# Patient Record
Sex: Male | Born: 1940 | Race: White | Hispanic: No | Marital: Married | Smoking: Never smoker
Health system: Southern US, Community
[De-identification: ages and names within clinical notes are randomized; demographics above are authoritative.]

## PROBLEM LIST (undated history)

## (undated) DIAGNOSIS — I10 Essential (primary) hypertension: Secondary | ICD-10-CM

## (undated) DIAGNOSIS — I252 Old myocardial infarction: Secondary | ICD-10-CM

## (undated) DIAGNOSIS — Z951 Presence of aortocoronary bypass graft: Secondary | ICD-10-CM

## (undated) DIAGNOSIS — M545 Low back pain, unspecified: Secondary | ICD-10-CM

## (undated) DIAGNOSIS — I219 Acute myocardial infarction, unspecified: Secondary | ICD-10-CM

## (undated) DIAGNOSIS — G8929 Other chronic pain: Secondary | ICD-10-CM

## (undated) DIAGNOSIS — M4800 Spinal stenosis, site unspecified: Secondary | ICD-10-CM

## (undated) DIAGNOSIS — E538 Deficiency of other specified B group vitamins: Secondary | ICD-10-CM

## (undated) DIAGNOSIS — J189 Pneumonia, unspecified organism: Secondary | ICD-10-CM

## (undated) DIAGNOSIS — E118 Type 2 diabetes mellitus with unspecified complications: Secondary | ICD-10-CM

## (undated) DIAGNOSIS — N184 Chronic kidney disease, stage 4 (severe): Secondary | ICD-10-CM

## (undated) DIAGNOSIS — N200 Calculus of kidney: Secondary | ICD-10-CM

## (undated) DIAGNOSIS — I739 Peripheral vascular disease, unspecified: Secondary | ICD-10-CM

## (undated) DIAGNOSIS — Z9289 Personal history of other medical treatment: Secondary | ICD-10-CM

## (undated) DIAGNOSIS — R234 Changes in skin texture: Secondary | ICD-10-CM

## (undated) DIAGNOSIS — I2510 Atherosclerotic heart disease of native coronary artery without angina pectoris: Secondary | ICD-10-CM

## (undated) DIAGNOSIS — I25810 Atherosclerosis of coronary artery bypass graft(s) without angina pectoris: Secondary | ICD-10-CM

## (undated) DIAGNOSIS — E785 Hyperlipidemia, unspecified: Secondary | ICD-10-CM

## (undated) DIAGNOSIS — H5440 Blindness, one eye, unspecified eye: Secondary | ICD-10-CM

## (undated) DIAGNOSIS — M1990 Unspecified osteoarthritis, unspecified site: Secondary | ICD-10-CM

## (undated) DIAGNOSIS — E1140 Type 2 diabetes mellitus with diabetic neuropathy, unspecified: Secondary | ICD-10-CM

## (undated) HISTORY — PX: CATARACT EXTRACTION W/ INTRAOCULAR LENS  IMPLANT, BILATERAL: SHX1307

## (undated) HISTORY — DX: Atherosclerosis of coronary artery bypass graft(s) without angina pectoris: I25.810

## (undated) HISTORY — DX: Peripheral vascular disease, unspecified: I73.9

## (undated) HISTORY — DX: Atherosclerotic heart disease of native coronary artery without angina pectoris: I25.10

## (undated) HISTORY — PX: CARPAL TUNNEL RELEASE: SHX101

## (undated) HISTORY — PX: DOPPLER ECHOCARDIOGRAPHY: SHX263

## (undated) HISTORY — PX: CHOLECYSTECTOMY: SHX55

## (undated) HISTORY — PX: TONSILLECTOMY: SUR1361

## (undated) HISTORY — DX: Personal history of other medical treatment: Z92.89

## (undated) HISTORY — PX: BACK SURGERY: SHX140

## (undated) HISTORY — DX: Old myocardial infarction: I25.2

## (undated) HISTORY — PX: CORONARY ARTERY BYPASS GRAFT: SHX141

---

## 1989-12-19 DIAGNOSIS — I2510 Atherosclerotic heart disease of native coronary artery without angina pectoris: Secondary | ICD-10-CM

## 1989-12-19 HISTORY — DX: Atherosclerotic heart disease of native coronary artery without angina pectoris: I25.10

## 1990-06-04 HISTORY — PX: CARDIAC CATHETERIZATION: SHX172

## 1997-01-13 HISTORY — PX: CARDIAC CATHETERIZATION: SHX172

## 1998-06-16 ENCOUNTER — Ambulatory Visit (HOSPITAL_COMMUNITY): Admission: RE | Admit: 1998-06-16 | Discharge: 1998-06-16 | Payer: Self-pay | Admitting: Cardiology

## 1998-12-19 DIAGNOSIS — I25810 Atherosclerosis of coronary artery bypass graft(s) without angina pectoris: Secondary | ICD-10-CM

## 1998-12-19 DIAGNOSIS — Z951 Presence of aortocoronary bypass graft: Secondary | ICD-10-CM

## 1998-12-19 DIAGNOSIS — I219 Acute myocardial infarction, unspecified: Secondary | ICD-10-CM

## 1998-12-19 HISTORY — PX: OTHER SURGICAL HISTORY: SHX169

## 1998-12-19 HISTORY — DX: Presence of aortocoronary bypass graft: Z95.1

## 1998-12-19 HISTORY — DX: Acute myocardial infarction, unspecified: I21.9

## 1998-12-19 HISTORY — DX: Atherosclerosis of coronary artery bypass graft(s) without angina pectoris: I25.810

## 1999-01-21 ENCOUNTER — Inpatient Hospital Stay (HOSPITAL_COMMUNITY): Admission: EM | Admit: 1999-01-21 | Discharge: 1999-01-29 | Payer: Self-pay | Admitting: Emergency Medicine

## 1999-01-21 ENCOUNTER — Encounter: Payer: Self-pay | Admitting: Emergency Medicine

## 1999-01-25 ENCOUNTER — Encounter: Payer: Self-pay | Admitting: Cardiovascular Disease

## 1999-01-26 ENCOUNTER — Encounter: Payer: Self-pay | Admitting: Cardiovascular Disease

## 1999-01-27 ENCOUNTER — Encounter: Payer: Self-pay | Admitting: Cardiothoracic Surgery

## 1999-01-28 HISTORY — PX: CARDIAC CATHETERIZATION: SHX172

## 2001-04-25 ENCOUNTER — Encounter: Admission: RE | Admit: 2001-04-25 | Discharge: 2001-04-25 | Payer: Self-pay | Admitting: Family Medicine

## 2001-04-25 ENCOUNTER — Encounter: Payer: Self-pay | Admitting: Family Medicine

## 2002-05-14 ENCOUNTER — Encounter: Admission: RE | Admit: 2002-05-14 | Discharge: 2002-05-14 | Payer: Self-pay | Admitting: Cardiology

## 2002-05-14 ENCOUNTER — Encounter: Payer: Self-pay | Admitting: Cardiology

## 2002-05-16 ENCOUNTER — Ambulatory Visit (HOSPITAL_COMMUNITY): Admission: RE | Admit: 2002-05-16 | Discharge: 2002-05-16 | Payer: Self-pay | Admitting: Cardiology

## 2002-05-16 HISTORY — PX: CARDIAC CATHETERIZATION: SHX172

## 2002-05-22 ENCOUNTER — Ambulatory Visit (HOSPITAL_COMMUNITY): Admission: RE | Admit: 2002-05-22 | Discharge: 2002-05-23 | Payer: Self-pay | Admitting: Cardiology

## 2002-05-22 HISTORY — PX: CORONARY ANGIOPLASTY WITH STENT PLACEMENT: SHX49

## 2002-09-16 ENCOUNTER — Inpatient Hospital Stay (HOSPITAL_COMMUNITY): Admission: EM | Admit: 2002-09-16 | Discharge: 2002-09-18 | Payer: Self-pay | Admitting: *Deleted

## 2002-09-16 ENCOUNTER — Encounter: Payer: Self-pay | Admitting: General Surgery

## 2002-09-17 ENCOUNTER — Encounter: Payer: Self-pay | Admitting: General Surgery

## 2002-09-17 ENCOUNTER — Encounter (INDEPENDENT_AMBULATORY_CARE_PROVIDER_SITE_OTHER): Payer: Self-pay | Admitting: Specialist

## 2002-10-04 ENCOUNTER — Emergency Department (HOSPITAL_COMMUNITY): Admission: EM | Admit: 2002-10-04 | Discharge: 2002-10-04 | Payer: Self-pay | Admitting: Emergency Medicine

## 2002-10-08 ENCOUNTER — Inpatient Hospital Stay (HOSPITAL_COMMUNITY): Admission: EM | Admit: 2002-10-08 | Discharge: 2002-10-25 | Payer: Self-pay | Admitting: Surgery

## 2002-10-09 ENCOUNTER — Encounter: Payer: Self-pay | Admitting: General Surgery

## 2002-10-09 ENCOUNTER — Encounter: Payer: Self-pay | Admitting: Internal Medicine

## 2002-10-10 ENCOUNTER — Encounter: Payer: Self-pay | Admitting: Surgery

## 2002-10-11 ENCOUNTER — Encounter: Payer: Self-pay | Admitting: General Surgery

## 2002-10-14 ENCOUNTER — Encounter (INDEPENDENT_AMBULATORY_CARE_PROVIDER_SITE_OTHER): Payer: Self-pay | Admitting: Cardiology

## 2002-10-18 ENCOUNTER — Encounter: Payer: Self-pay | Admitting: General Surgery

## 2002-10-21 ENCOUNTER — Encounter: Payer: Self-pay | Admitting: Surgery

## 2002-10-22 ENCOUNTER — Encounter: Payer: Self-pay | Admitting: General Surgery

## 2002-10-25 ENCOUNTER — Encounter: Payer: Self-pay | Admitting: General Surgery

## 2002-11-01 ENCOUNTER — Ambulatory Visit (HOSPITAL_COMMUNITY): Admission: RE | Admit: 2002-11-01 | Discharge: 2002-11-01 | Payer: Self-pay | Admitting: General Surgery

## 2002-11-01 ENCOUNTER — Encounter: Payer: Self-pay | Admitting: General Surgery

## 2003-06-16 ENCOUNTER — Ambulatory Visit (HOSPITAL_COMMUNITY): Admission: RE | Admit: 2003-06-16 | Discharge: 2003-06-16 | Payer: Self-pay | Admitting: Gastroenterology

## 2003-06-16 ENCOUNTER — Encounter (INDEPENDENT_AMBULATORY_CARE_PROVIDER_SITE_OTHER): Payer: Self-pay | Admitting: *Deleted

## 2003-08-26 ENCOUNTER — Encounter: Payer: Self-pay | Admitting: Emergency Medicine

## 2003-08-27 ENCOUNTER — Encounter: Payer: Self-pay | Admitting: Cardiology

## 2003-08-27 ENCOUNTER — Inpatient Hospital Stay (HOSPITAL_COMMUNITY): Admission: EM | Admit: 2003-08-27 | Discharge: 2003-08-28 | Payer: Self-pay | Admitting: Emergency Medicine

## 2003-08-28 HISTORY — PX: CARDIAC CATHETERIZATION: SHX172

## 2005-07-07 ENCOUNTER — Ambulatory Visit (HOSPITAL_COMMUNITY): Admission: RE | Admit: 2005-07-07 | Discharge: 2005-07-07 | Payer: Self-pay | Admitting: Cardiology

## 2005-07-07 HISTORY — PX: CARDIAC CATHETERIZATION: SHX172

## 2006-04-19 ENCOUNTER — Encounter: Payer: Self-pay | Admitting: General Surgery

## 2006-10-11 ENCOUNTER — Ambulatory Visit (HOSPITAL_COMMUNITY): Admission: RE | Admit: 2006-10-11 | Discharge: 2006-10-11 | Payer: Self-pay | Admitting: Gastroenterology

## 2008-12-19 DIAGNOSIS — Z9289 Personal history of other medical treatment: Secondary | ICD-10-CM

## 2008-12-19 HISTORY — DX: Personal history of other medical treatment: Z92.89

## 2009-09-08 ENCOUNTER — Encounter: Admission: RE | Admit: 2009-09-08 | Discharge: 2009-09-08 | Payer: Self-pay | Admitting: Orthopedic Surgery

## 2009-10-16 ENCOUNTER — Inpatient Hospital Stay (HOSPITAL_COMMUNITY): Admission: RE | Admit: 2009-10-16 | Discharge: 2009-10-19 | Payer: Self-pay | Admitting: Orthopedic Surgery

## 2010-06-17 ENCOUNTER — Encounter: Admission: RE | Admit: 2010-06-17 | Discharge: 2010-06-17 | Payer: Self-pay | Admitting: Orthopedic Surgery

## 2010-07-29 ENCOUNTER — Inpatient Hospital Stay (HOSPITAL_COMMUNITY): Admission: RE | Admit: 2010-07-29 | Discharge: 2010-07-31 | Payer: Self-pay | Admitting: Orthopedic Surgery

## 2010-07-29 ENCOUNTER — Encounter (INDEPENDENT_AMBULATORY_CARE_PROVIDER_SITE_OTHER): Payer: Self-pay | Admitting: Orthopedic Surgery

## 2011-03-04 LAB — COMPREHENSIVE METABOLIC PANEL
ALT: 24 U/L (ref 0–53)
AST: 25 U/L (ref 0–37)
Albumin: 4.3 g/dL (ref 3.5–5.2)
Calcium: 9.3 mg/dL (ref 8.4–10.5)
Chloride: 107 mEq/L (ref 96–112)
Sodium: 140 mEq/L (ref 135–145)

## 2011-03-04 LAB — GLUCOSE, CAPILLARY
Glucose-Capillary: 169 mg/dL — ABNORMAL HIGH (ref 70–99)
Glucose-Capillary: 195 mg/dL — ABNORMAL HIGH (ref 70–99)
Glucose-Capillary: 84 mg/dL (ref 70–99)

## 2011-03-04 LAB — CBC
HCT: 34.7 % — ABNORMAL LOW (ref 39.0–52.0)
Hemoglobin: 12 g/dL — ABNORMAL LOW (ref 13.0–17.0)
MCV: 94.6 fL (ref 78.0–100.0)
RDW: 13.2 % (ref 11.5–15.5)
WBC: 6.7 10*3/uL (ref 4.0–10.5)

## 2011-03-23 LAB — GLUCOSE, CAPILLARY: Glucose-Capillary: 177 mg/dL — ABNORMAL HIGH (ref 70–99)

## 2011-03-24 LAB — GLUCOSE, CAPILLARY
Glucose-Capillary: 152 mg/dL — ABNORMAL HIGH (ref 70–99)
Glucose-Capillary: 159 mg/dL — ABNORMAL HIGH (ref 70–99)
Glucose-Capillary: 165 mg/dL — ABNORMAL HIGH (ref 70–99)
Glucose-Capillary: 185 mg/dL — ABNORMAL HIGH (ref 70–99)
Glucose-Capillary: 211 mg/dL — ABNORMAL HIGH (ref 70–99)
Glucose-Capillary: 86 mg/dL (ref 70–99)

## 2011-03-24 LAB — COMPREHENSIVE METABOLIC PANEL
ALT: 28 U/L (ref 0–53)
AST: 26 U/L (ref 0–37)
Alkaline Phosphatase: 54 U/L (ref 39–117)
CO2: 27 mEq/L (ref 19–32)
Calcium: 9.5 mg/dL (ref 8.4–10.5)
GFR calc Af Amer: 58 mL/min — ABNORMAL LOW (ref >60)
GFR calc non Af Amer: 48 mL/min — ABNORMAL LOW (ref >60)
Glucose, Bld: 188 mg/dL — ABNORMAL HIGH (ref 70–99)
Potassium: 4.2 mEq/L (ref 3.5–5.1)
Sodium: 141 mEq/L (ref 135–145)

## 2011-03-24 LAB — CROSSMATCH
ABO/RH(D): O POS
Antibody Screen: NEGATIVE

## 2011-03-24 LAB — HEMOGLOBIN AND HEMATOCRIT, BLOOD
HCT: 23.4 % — ABNORMAL LOW (ref 39.0–52.0)
Hemoglobin: 8.1 g/dL — ABNORMAL LOW (ref 13.0–17.0)
Hemoglobin: 9.9 g/dL — ABNORMAL LOW (ref 13.0–17.0)

## 2011-03-24 LAB — POCT I-STAT 4, (NA,K, GLUC, HGB,HCT)
Glucose, Bld: 90 mg/dL (ref 70–99)
Hemoglobin: 11.2 g/dL — ABNORMAL LOW (ref 13.0–17.0)
Potassium: 4.2 mEq/L (ref 3.5–5.1)

## 2011-05-06 NOTE — Cardiovascular Report (Signed)
[place]. [facility]  Patient:    Allen Medina, Allen Medina Visit Number: [id number] MRN: [id number]          Service Type: CAT Location: [place] 01 Attending Physician:  Lawana Pai Dictated by:   Chase Picket, M.D. Proc. Date: 05/16/02 Admit Date:  05/16/2002 Discharge Date: 05/16/2002   CC:         Cardiac Catheterization Laboratory  CVTS  Elayne Snare, M.D.   Cardiac Catheterization  INDICATIONS FOR PROCEDURE: The patient is a 70 year old male who had re-do bypass surgery in 2000 with his initial bypass surgery being 1991. He presented with anginal complaints of tiredness and fatigue, no energy and diminished stamina, which was his anginal complaint in the past. He is broughT in for outpatient cardiac catheterization with graft visualization.  DESCRIPTION OF PROCEDURE: The patient was prepped and draped in the usual sterile fashion exposing the right groin.  Following local anesthetic with 1% Xylocaine, the Seldinger technique was employed and a 6 [place] introducer sheath was placed into the right femoral artery.  Selective left and right coronary arteriography, graft visualization x4, and ventriculography in the RAO projection was performed.  RESULTS: 1. Hemodynamic monitoring: Central aortic pressure was 172/74, left    ventricular pressure 172/15 with no aortic valve gradient noted at the    time of pullback. 2. Ventriculography: Ventriculography in the RAO projection revealed    mild diffuse global hypokinesia with an ejection fraction of [id number],    end-diastolic pressure 15.  CORONARY ARTERIOGRAPHY: Calcification in the distribution of the LAD was noted on fluoroscopy. 1. Left main: The left main had a long tapering 80% area of narrowing. 2. LAD: The LAD is 100% occluded at its ostium. 3. Circumflex: The circumflex was subtotally and had a string-like appearance    in the AV groove. There was a very small 1 mm OM branch faintly  visualized. 4. Right coronary artery: The right coronary artery was diffusely disease with    proximal mid 50-60% areas of narrowing and the distal portion had a very    long beaded area of 80% narrowing. The PDA and PL, however, looked    relatively free of disease.  GRAFT VISUALIZATION: 1. Saphenous vein graft to the diagonal. The graft itself was widely patent.    The diagonal was free of disease and there was faint ______ visualization    of the LAD via collaterals and retrograde filling. 2. Saphenous vein graft to OM-1 and OM-2. The graft was widely patent. OM-1    and OM-2 were widely patent with a large vascular beds. There were    collaterals coming off of the circumflex off the marginals that supplied    the LAD. 3. Free RIMA to the PDA. The mid graft had an area that was about 20 mm    in length and about 75% narrowed. It was eccentric in nature. The distal    graft was free of disease and the PDA itself was free of disease. 4. Left internal mammary artery to the LAD. The LIMA itself was free of    disease. The LAD was free of disease distally and the proximal portion    well above the insertion of the LAD had an area of 75% occlusion.  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 This needs to be intervened upon. I talked with the patient and we  will bring him back with a 3.0 x 23 coated stent is available. We currently have none of these available at this time. Dictated by:   Chase Picket, M.D. Attending Physician:  Lawana Pai DD:  05/16/02 TD:  05/17/02 Job: [id number]

## 2011-05-06 NOTE — Cardiovascular Report (Signed)
[place]  Patient:    HATTAN, PICHON Visit Number: [id number] MRN: [id number]          Service Type: CAT Location: [place] 02 Attending Physician:  Lawana Pai Dictated by:   Chase Picket, M.D. Proc. Date: 05/22/02 Admit Date:  05/22/2002 Discharge Date: 05/23/2002   CC:         Elayne Snare, M.D.  [facility]   Cardiac Catheterization  PROCEDURE:  Primary stenting to free right internal mammary artery, that is used as a graft to the posterior descending artery.  INDICATIONS FOR TEST:  The patient is a 70 year old male who has had two bypass surgeries.  He was brought in for cardiac catheterization on May 16, 2002 and noted to have a tubular lesion in the free right internal mammary artery, that we used to graft his PDA artery.  There was a 75% area of tubular narrowing that was eccentric in appearance, that almost looked like a dissection.  At the time of his original catheterization we did not have appropriate equipment to address this.  He is brought in for intervention today.  DESCRIPTION OF PROCEDURE:  The patient was prepped and draped in the usual sterile fashion, exposing the right groin and applying local anesthetic with 1% Xylocaine.  A 7-French introducer sheath was placed in the right femoral artery.  A 7-French right coronary bypass catheter was used to cannulate the ostium of the graft.  A 182 cm Luge was then passed in the distal portion of the graft.  A 3.0 x 23 mm Cypher stent was placed in such a position that both the proximal and distal portion of the area of narrowing was covered.  It was initially deployed at 14 atm for 60 sec, with the final inflation being 16 x 60.  The 75% area of eccentric narrowing now appeared to be normal after stenting.  There was TIMI-3 flow both pre- and post intervention.  DISPOSITION:  The patient was given 5000 units of intravenous heparin, and had an ACT in  the mid portion of the procedure at 287.  He will be maintained on Integrilin for 12 hours following the procedure, and should be ready for discharge in the morning. Dictated by:   Chase Picket, M.D. Attending Physician:  Lawana Pai DD:  05/22/02 TD:  05/23/02 Job: [id number]

## 2011-05-06 NOTE — Consult Note (Signed)
NAME:  Allen Medina, Allen Medina                        ACCOUNT NO.:  [id number]   MEDICAL RECORD NO.:  [id number]                   PATIENT TYPE:  INP   LOCATION:  5703                                 FACILITY:  [facility]   PHYSICIAN:  Jeanella Craze. Little, M.D.              DATE OF BIRTH:  02-20-1941   DATE OF CONSULTATION:  DATE OF DISCHARGE:                                   CONSULTATION   Mr. Allen Medina is located at 92.   HISTORY OF PRESENT ILLNESS:  Allen Medina is a 70 year old male who is well-  known to me.  He was admitted at this time with right upper quadrant pain,  had a positive abdominal ultrasound for gallstones and needs a  cholecystectomy in the morning.   His cardiac history includes bypass surgery in 1991 and redo surgery in  February 2000.  He had a stent placed to the right internal mammary artery  to the PDA on 05/22/02.  This was a cipher stent, and he has been on Plavix  for over three months.   He denies any anginal complaints, no shortness of breath, palpitations, PND,  or orthopnea.  Blood pressure has been well controlled.  His diabetes has  been slightly increased.  He does not smoke cigarettes.   PAST MEDICAL HISTORY:  Includes type 2 diabetes since 1986, hyperlipidemia,  history of CAD, and hypertension.   OUTPATIENT MEDICATIONS:  Diovan, HCTZ 160/12.5, Plavix 25 mg, Glyburide 5 mg  b.i.d., Glucophage XR 500 mg b.i.d., aspirin once a day, Lescol 80 mg.   REVIEW OF SYSTEMS:  No peripheral swelling.  Loose bowel movements with  greasy meals, but no vomiting or nausea.  Exercise tolerance has been good.  Weight:  Stable.  No bleeding from the Plavix.   PHYSICAL EXAMINATION:  Blood pressure 130/72, pulse 80, respirations 20.  LUNGS:  Completely clear.  CARDIAC:  Regular rhythm, distant heart sounds, no murmur.  ABDOMEN:  Normal bowel sounds, right upper quadrant tenderness to palpation.  NECK:  No carotid bruits.  EXTREMITIES:  +2 pulses in the lower  extremities.  SKIN:  Warm and dry.   LABORATORY DATA:  Showed normal CK and troponin.  EKG showing nonspecific ST  segment changes.  Gallbladder ultrasound report as being positive for  stones.  Liver functions were normal with SGOT/SGPT being 24 and 27.   ASSESSMENT:  1. Right upper quadrant tenderness with positive ultrasound.  2. Coronary artery disease, stable, status post redo bypass surgery in 2000     and cipher stent placement to his right internal mammary artery to the     posterior descending artery in June 2003.  3. Hypertension, well controlled.  4. Type 2 diabetes.  5. The patient is cleared for surgery.  It is o.k. to stop his Plavix for     now and restart after surgery.  My only concern is the cipher stent as  there is an increase incidence of thrombosis within the stent unless the     patients have been on Plavix for a minimum of 90 day which Allen Medina has     done.  The risk should be relatively low to interrupt this.                                               Jeanella Craze. Little, M.D.    ABL/MEDQ  D:  09/16/2002  T:  09/18/2002  Job:  [id number]

## 2011-05-06 NOTE — Cardiovascular Report (Signed)
Allen Medina, Allen Medina              ACCOUNT NO.:  [id number]   MEDICAL RECORD NO.:  [id number]          PATIENT TYPE:  OIB   LOCATION:  2899                         FACILITY:  [facility]   PHYSICIAN:  Jeanella Craze. Little, M.D. DATE OF BIRTH:  Apr 22, 1941   DATE OF PROCEDURE:  07/07/2005  DATE OF DISCHARGE:                              CARDIAC CATHETERIZATION   INDICATIONS FOR TEST:  This 70 year old male had bypass surgery in 1991 and  redo bypass surgery in 2000.  He subsequently underwent stent placement in  2003 with a stent into the free right internal mammary artery that ran to  the posterior descending artery.  Cath September, 2004 was widely patent.   He presents with new onset of marked exertional dyspnea.  Normal activities  make him breathless, he has had some minimal chest pain, he perceives this  as all being new.   He has longstanding diabetes and is on metformin and Benicar.  Both of these  were held prior to the cath.  His initial creatinine was slightly elevated  at 1.7, but after holding the Benicar his creatinine was slightly elevated  at 1.5, but we held the Benicar prior to the cardiac catheterization.   After obtaining informed consent, the patient was prepped and draped in the  usual sterile fashion exposing the right groin.  Applying local anesthetic  with 1% Xylocaine, the Seldinger technique was employed and a 5 [place]  introducer sheath was placed into the right femoral artery.  Left and right  coronary arteriography, ventriculography in the RAO projection and an aortic  root was performed.   COMPLICATIONS:  None.   EQUIPMENT:  The 5 French Judkins configuration catheters and allografts  including the internal mammary artery were subselectively cannulated with a  right coronary catheter.   TOTAL CONTRAST:  140 mL.   RESULTS:  1.  Hemodynamic monitoring.  Central aortic pressure is 163/89, left      ventricular pressure was 155/1 with no aortic valve gradient  noted at      the time of pullback.  2.  Ventriculography.  Ventriculography in the RAO projection revealed a      slightly dilated left ventricle with an ejection fraction of 45-50%,      PVCs during the ventriculogram were noted and there was +1 mitral      regurgitation that may have been PVC induced.  3.  Aortic root:  The aortic root appeared to be slightly dilated.  There      was clearly three grafts going towards the anterior portion of the heart      and one going towards the posterior portion of heart.  Each of these      grafts were subselectively evaluated.   NATIVE CORONARY ARTERIOGRAPHY:  There was dense calcification in the left  main and LAD.  1.  Left main terminal 80% narrowing.  2.  LAD 100% occluded at its ostium.  3.  Circumflex 100% occluded in his mid portion, but diffusely diseased      proximal to this.  There was patent visualization of a small OM.  4.  Right coronary artery.  Diffusely diseased throughout with severe      narrowing that is graded 75-80%.  The posterior descending artery,      however, was visualized.  The RCA system in its more distal portion was      extremely small.  In order to do any type of intervention to this, it      would take almost complete stenting, and the distal portion of the      vessel was too small for stent and would require cutting balloon.   GRAFTS:  1.  Left internal mammary artery to the mid LAD.  The internal mammary      artery was widely patent, the LAD was small.  The distal and mid LAD      were widely patent.  There was retrograde filling of the LAD into the      proximal portion.  There was moderate irregularities of 60-75% in the      proximal portion of the LAD, this is unchanged from his cath in 2004 and      these areas are not amenable to any type of intervention.  2.  Saphenous vein graft sequentially to OM #2 and OM #3.  This graft is      widely patent.  OM #2 and OM #3 are both small but free of  disease.  3.  Saphenous vein graft to OM #1.  The graft is relatively small in      diameter with moderate irregularities.  The OM itself is relatively      small also.  It is free of significant disease.  4.  Saphenous vein graft to the diagonal.  The graft is widely patent.  The      diagonal is small and free of disease.  5.  Free RIMA to the PDA.  The RIMA is widely patent.  There is a stent in      the mid portion of it which is widely patent.  There are moderate      irregularities in the proximal and distal portion of this graft.  The      PDA is small and free of significant disease.  This vessel that the RIMA      inserts into does not appear to be the same vessel as noted from the      native circulation, raising the concern that he may have some ischemia      based on the native circulation.   CONCLUSION:  Patent grafts including patent stent in the free right internal  mammary artery to the posterior descending artery, diffuse native disease.   In comparing the native PDA from the right coronary artery and the vessel  that the free RIMA inserts into, which is supposed to be the PDA, I am not  convince that these are same vessels.  It is not inconceivable that his  symptoms of dyspnea could not be related to severe RCA native disease and  decreased flow into the PDA.  The right system, however, is diffusely  diseased and then the distal portion is too small for stenting.  I plan to  ask opinions of my partners regarding whether or not they would undertake  any type of intervention.  He has not had a Cardiolite study in the past,  even when he had significant disease the Cardiolite studies were  unremarkable.   He will be discharged to home today.  He will restart  his Glucophage and his  Benicar in 48 hours.  He will be checked tomorrow to make sure his cath site  is free of significant hematoma.      ABL/MEDQ  D:  07/07/2005  T:  07/07/2005  Job:  [id number]   cc:    Judithe Modest, M.D.  [address]  [place]  [place] [address]  Fax: [phone]   Cath Lab

## 2011-05-06 NOTE — Op Note (Signed)
NAMEKOHLSON, STEFFENSMEIER              ACCOUNT NO.:  [id number]   MEDICAL RECORD NO.:  [id number]          PATIENT TYPE:  AMB   LOCATION:  ENDO                         FACILITY:  [facility]   PHYSICIAN:  Nelwyn Salisbury, M.D.  DATE OF BIRTH:  1941/03/14   DATE OF PROCEDURE:  10/11/2006  DATE OF DISCHARGE:                                 OPERATIVE REPORT   PROCEDURE PERFORMED:  Screening colonoscopy.   ENDOSCOPIST:  Nelwyn Salisbury, M.D.   INSTRUMENT USED:  Olympus video colonoscope.   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  The patient was found to have guaiac-positive stools in the recent past.   PREPROCEDURE PREPARATION:  Informed consent was procured from the patient.  The patient was fasted for 4 hours prior to the procedure and prepped with  20 Osmoprep pills the night of and 12 Osmoprep pills the morning of the  procedure.  The risks and benefits of the procedure including a 10% missed  rate of cancer in polyps were discussed with the patient as well.   PREPROCEDURE PHYSICAL:  VITAL SIGNS:  The patient had stable vital signs.  NECK:  Supple.  CHEST:  Clear to auscultation.  CARDIAC:  S1 and S2 regular.  ABDOMEN:  Soft with normal bowel sounds.   DESCRIPTION OF THE PROCEDURE:  The patient was placed in the left lateral  decubitus position and sedated with 75 mcg of fentanyl and 7 mg of Versed in  slow incremental doses.  Once the patient was adequately sedate and  maintained on low-flow oxygen and continuous cardiac monitoring, the Olympus  video colonoscope was advanced from the rectum to the cecum.  There was a  large amount of residual stool in the colon and multiple washings were done.  No masses, polyps, erosions or ulcerations were seen.  Small internal  hemorrhoids were appreciated on retroflexion in the rectum.  A small patch  of erythema was noted in the mid right  colon; this did not seem to be  typical of an AVM, but was not biopsied for fear of bleeding.  Multiple  washings were done.  The appendiceal orifice and ileocecal valve were  clearly visualized and photographed.  The patient tolerated the procedure  well without immediate complications.   IMPRESSION:  1.?Small internal hemorrhoids seen on retroflexion.  2.?Erythematous spot in the mid right colon not typical of an arteriovenous  malformation, no biopsies done.  3.No masses, polyps or diverticula seen.  4.Significant amount of residual stool in the colon, multiple washings done.  5.Normal terminal ileum.   RECOMMENDATIONS:  1.Considering the patient's personal history of tubular  adenomas, a repeat colonoscopy has been advised in the next 5 years.  2.Outpatient followup for repeat guaiac testing.  3.Further recommendations will be made thereafter.      Nelwyn Salisbury, M.D.  Electronically Signed     JNM/MEDQ  D:  10/11/2006  T:  10/13/2006  Job:  [id number]   cc:   Janalyn Rouse, M.D.

## 2011-05-06 NOTE — Op Note (Signed)
   NAME:  Allen Medina, Allen Medina                        ACCOUNT NO.:  [id number]   MEDICAL RECORD NO.:  [id number]                   PATIENT TYPE:  AMB   LOCATION:  ENDO                                 FACILITY:  [facility]   PHYSICIAN:  Nelwyn Salisbury, M.D.               DATE OF BIRTH:  1941/07/26   DATE OF PROCEDURE:  06/16/2003  DATE OF DISCHARGE:                                 OPERATIVE REPORT   PROCEDURE:  Screening colonoscopy with snare polypectomy x1 and cold  biopsies x4.   ENDOSCOPIST:  Nelwyn Salisbury, M.D.   INSTRUMENT USED:  Olympus video colonoscope.   INDICATIONS FOR PROCEDURE:  A 70 year old white male with a history of  diabetes and change in bowel habits undergoing screening colonoscopy to rule  out colonic polyps, masses, etc.   PREPROCEDURE PREPARATION:  Informed consent was procured from the patient.  The patient fasted for eight hours prior to the procedure and prepped with a  bottle of magnesium citrate and a gallon of GoLYTELY the night prior to the  procedure.   PREPROCEDURE PHYSICAL:  The patient had stable vital signs. Neck supple.  Chest clear to auscultation. S1, S2 regular. Abdomen soft with normal bowel  sounds.   DESCRIPTION OF PROCEDURE:  The patient was placed in the left lateral  decubitus position and sedated with 50 mg of Demerol and 5 mg of Versed  intravenously. Once the patient was adequately sedated and maintained on low  flow oxygen and continuous cardiac monitoring, the Olympus video colonoscope  was advanced from the rectum to the cecum without difficulty. A small  sessile polyp was snared from 50 cm and another small sessile polyp was  biopsied from the cecum x4. The rest of the colonic mucosa appeared healthy  with a normal vascular pattern. The terminal ileum appeared normal as well.   IMPRESSION:  1. Small sessile polyp snared at 50 cm.  2. Small polyp biopsied from the base of the cecum x4.  3. Normal terminal ileum.  4. No evidence  of diverticulosis.  5. Retroflexion in the rectum revealed no abnormalities.    RECOMMENDATIONS:  1. Await pathology results.  2. Avoid nonsteroidals including aspirin for the next four weeks.  3. Repeat colorectal cancer screening depending on pathology results.                                               Nelwyn Salisbury, M.D.    JNM/MEDQ  D:  06/16/2003  T:  06/17/2003  Job:  [id number]   cc:   Beckey Downing, M.D.  [address] [place], [place] [address]  Fax: [phone]

## 2011-05-06 NOTE — Op Note (Signed)
NAME:  Allen Medina, Allen Medina                        ACCOUNT NO.:  [id number]   MEDICAL RECORD NO.:  [id number]                   PATIENT TYPE:  INP   LOCATION:  5703                                 FACILITY:  [facility]   PHYSICIAN:  Merri Ray. Grandville Silos, M.D.             DATE OF BIRTH:  1941-04-29   DATE OF PROCEDURE:  DATE OF DISCHARGE:                                 OPERATIVE REPORT   PREOPERATIVE DIAGNOSES:  Cholecystitis.   POSTOPERATIVE DIAGNOSES:  cholecystitis.   PROCEDURE:  Laparoscopic cholecystectomy with intraoperative cholangiogram.   SURGEON:  Merri Ray. Grandville Silos, M.D.   ASSISTANT:  Lew Dawes. Rosana Hoes, M.D.   ANESTHESIA:  General.   HISTORY OF PRESENT ILLNESS:  The patient is a 70 year old white male with a  history of diabetes and significant coronary artery disease, status post  CABG x 2 and with recent stent who was admitted early Sunday morning with  right upper quadrant pain.  Further workup revealed acute cholecystitis and  he is now taken to the operating room after cardiology clearance for  laparoscopic cholecystectomy.   DESCRIPTION OF PROCEDURE:  The patient was taken to the operating room and  general anesthesia was administered and his abdomen was prepped and draped  in a sterile fashion.  A transverse incision was made beneath the umbilicus,  subcutaneous tissues were dissected down and the fascia was incised, and the  peritoneal cavity was entered.  A 0-Vicryl pursestring suture was placed in  the fascia and the Hasson trocar was placed.  The abdomen was insufflated  with carbon dioxide in the standard fashion. Subsequently, under direct  vision the epigastric size 10 port and two size 5 lateral ports were placed  after local anesthesia with 0.25% Marcaine.  Subsequently the gallbladder  was retracted superiorly.  The infundibulum was retracted inferolaterally.  Several adhesions were removed bluntly and using some cautery and  subsequently the cystic duct was  dissected and the area had some significant  inflammation but the cystic duct was able to be dissected out with a good  window between the cystic duct, the liver and the gallbladder.  A proximal  clip was placed and then a cholangiogram catheter was inserted and an  intraoperative cholangiogram was done and revealed no obstruction to the  common bile duct and the cystic duct, common bile duct junction was easily  visualized.  Subsequently the cholangiogram catheter was removed.  The  cystic duct was clipped three times proximally, and then divided.  Subsequently further dissection revealed the cystic artery which was  dissected free.  Three clips were placed proximally and one distally and  this was also divided.  The gallbladder was then taken off the liver bed.  There was quite a bit of inflammation but this was accomplished and several  areas on the liver bed were cauterized with a Bovie for good hemostasis.  The gallbladder was removed and placed in an Endo-catch  bag and removed from  the abdomen.  The liver bed was rechecked for bleeding and Bovie cautery was  used in several areas.  The abdomen was irrigated out and no further  bleeding was noted.  A piece of Surgicel was placed in the liver bed and  then the ports were removed under direct vision, after irrigation proved to  be clear.  The pneumoperitoneum was released and pursestring suture was  securely tied at the  umbilicus and all incisions were closed with running 4-0 Vicryl subcuticular  stitch and the Benzoin and Steri-Strips were placed.  Sponge and needle  counts were all correct.  The patient tolerated the procedure well without  complications and was taken to the recovery room in stable condition.                                                Merri Ray Grandville Silos, M.D.    BET/MEDQ  D:  09/17/2002  T:  09/18/2002  Job:  [id number]

## 2011-05-06 NOTE — Discharge Summary (Signed)
   NAME:  SALIF, SERVELLON                        ACCOUNT NO.:  [id number]   MEDICAL RECORD NO.:  [id number]                   PATIENT TYPE:  INP   LOCATION:  5703                                 FACILITY:  [facility]   PHYSICIAN:  Merri Ray. Grandville Silos, M.D.             DATE OF BIRTH:  1941/02/07   DATE OF ADMISSION:  09/15/2002  DATE OF DISCHARGE:  09/18/2002                                 DISCHARGE SUMMARY   DISCHARGE DIAGNOSIS:  Acute cholecystitis status post laparoscopic  cholecystectomy with intraoperative cholangiogram.   HISTORY OF PRESENT ILLNESS:  The patient is a 70 year old white male with a  history of diabetes and CABG x2 who was admitted with acute cholecystitis.   HOSPITAL COURSE:  After admission the patient was evaluated by his  cardiologist, Dr. Rex Kras considering he has significant coronary artery  disease history with recent stenting.  He was cleared for surgery and taken  to the operating room where he underwent an uncomplicated laparoscopic  cholecystectomy.  Intraoperative cholangiogram was clear.  Postoperatively,  the patient tolerated gradual advancement of his diet and was ambulating  well and tolerating regular diet.  He remained stable and was discharged  home on postop day #1.   DISCHARGE DIET:  Regular.   DISCHARGE MEDICATIONS:  Percocet one to two p.o. q.6h. p.r.n. pain, and the  patient is to resume his preoperative medications including Plavix as per  Dr. Rex Kras.   FOLLOW UP:  The patient is to follow up with me in my office in 2 weeks.                                               Merri Ray Grandville Silos, M.D.    BET/MEDQ  D:  09/18/2002  T:  09/21/2002  Job:  [id number]

## 2011-05-06 NOTE — H&P (Signed)
NAME:  Allen Medina, Allen Medina                        ACCOUNT NO.:  [id number]   MEDICAL RECORD NO.:  [id number]                   PATIENT TYPE:  OBV   LOCATION:  3709                                 FACILITY:  [facility]   PHYSICIAN:  Virgilio Belling. Ezzie Dural, MD             DATE OF BIRTH:  08-29-1941   DATE OF ADMISSION:  08/26/2003  DATE OF DISCHARGE:                                HISTORY & PHYSICAL   Allen Medina is a 70 year old white man who is admitted to [facility] for further evaluation of chest pain.   HISTORY OF PRESENT ILLNESS:  The patient has a history of coronary artery  disease which dates back to 11.  At that time he underwent five-vessel  coronary artery bypass surgery.  In 2000 he returned for a re-do procedure  and received four bypasses.  In May of 2003 he returned because of recurrent  chest pain.  He underwent cardiac catheterization.  This demonstrated the  following results.  The left main had a long tapering 80% area of narrowing.  The LAD was 100% occluded at its ostium.  The circumflex was subtotally  occluded and had a string-like appearance in the A-V groove.  There was a  very small one mm obtuse marginal branch which was faintly visualized.  The  right coronary artery was diffusely diseased with a proximal to mid area of  50 to 60% narrowing.  The distal portion demonstrated a very long beaded  area of 80% narrowing.  The PDA and posterolateral branch appeared  relatively free of disease.  The saphenous vein graft to the diagonal was  widely patent.  The diagonal was free of disease and there was faint  visualization of the LAD via collaterals and retrograde filling.  The  saphenous vein graft at the first and second obtuse marginal was widely  patent.  The first and second obtuse marginal were widely patent with a  large vascular bed.  There were collaterals coming off into the circumflex  off of the marginal's that supply the LAD.  The free right  internal mammary  artery graft to the PDA had an area that was about 20 mm in length and  approximately 75% narrowed.  It was eccentric in nature.  The distal graft  was free of disease and the PDA itself was free of disease.  The left  internal mammary artery graft to the LAD was free of disease.  The LAD was  free of disease distally and the proximal portion well above the insertion  of the LAD had an area of 75% occlusion.  Several days later the patient  underwent successful primary stenting to the free right internal mammary  artery graft to the PDA.  Since that time the patient has had an  uncomplicated course.  He had not experienced any chest pain until today.   The patient presented to the emergency department this  evening after  experiencing chest pain which began at approximately 2 p.m. this afternoon.  It occurred while he was watching television.  The chest discomfort was a  low-grade, vague pressure in the center of his chest.  It did not radiate.  It was not associated with dyspnea, diaphoresis, or nausea.  There were no  exacerbating or ameliorating factors.  It appeared not to be related to  position, activity, meals, or respirations.  He did not have any  nitroglycerin to take.  The discomfort waxed and waned, mild at its maximum,  throughout the course of the day and ultimately prompted his visit to the  emergency department.  His chest pain has since resolved.  He is  asymptomatic and feels well at this time.   The patient had no history of congestive heart failure or arrhythmia.   In addition to the cardiac disease noted above, the patient has a history of  hypertension, hyperlipidemia, and non insulin-dependent diabetes mellitus.   MEDICATIONS:  The patient is on a number of medications for the  aforementioned problems.  1. Glipizide.  2. Glucophage.  3. Diovan.  4. Atenolol.  5. Lovastatin.  (He is not aware of the doses).   ALLERGIES:  He is not allergic to  any medications.   SOCIAL HISTORY:  The patient is a retired Conservator, museum/gallery.  He lives  with his wife and granddaughter.   SURGICAL HISTORY:  He has undergone a cholecystectomy, as well as drainage  of an hepatic abscess which complicated that cholecystectomy, as well as the  two bypass operations described above.   FAMILY HISTORY:  There is a family history of early coronary artery disease  (father suffered a myocardial infarction in his 65's, uncle suffered a  myocardial infarction in his 60's) and diabetes mellitus.   The patient does not smoke or drink.   REVIEW OF SYSTEMS:  Reveals no new problems related to his head, eyes, ears,  nose, mouth, throat, lungs, gastrointestinal system, genitourinary system,  or extremities.  There is no history of neurologic or psychiatric disorder.  There is no history of fever, chills, or weight loss.   PHYSICAL EXAMINATION:  VITAL SIGNS: Blood pressure 169/83.  Pulse 58 and  regular.  Respirations 22.  Temperature 98.3.  GENERAL: The patient is a middle-aged white man in no discomfort.  He was  alert, oriented, appropriate, and responsive.  HEENT: Head, eyes, nose, and mouth were normal.  NECK: Without thyromegaly or adenopathy.  Carotid pulses were palpable  bilaterally and without bruits.  CARDIAC EXAM: Revealed a normal S1 and S2.  There was no S3, S4, murmur,  rub, or click.  Cardiac rhythm was regular.  No chest wall tenderness was  noted.  LUNGS: Clear.  ABDOMEN: Soft and nontender.  There was no mass, hepatosplenomegaly, bruit,  distention, rebound, guarding, or rigidity.  Bowel sounds were normal.  RECTAL/GENITAL EXAM: Not performed today and not pertinent to the reason for  acute care hospitalization.  EXTREMITIES: Without edema, deviation, or deformity. Radial and dorsalis  pedis pulses were palpable bilaterally.  BRIEF SCREENING NEUROLOGIC SURVEY: Was unremarkable.  The electrocardiogram revealed normal sinus rhythm.   There were nonspecific  ST and T wave abnormalities.  The chest radiograph, according to the  radiologist, demonstrated no evidence of acute disease.  Initial myoglobin  was 111 with a CK-MB of 2.0 and troponin of less than 0.05.  BUN was 12,  creatinine 1.2, and potassium 3.7.  White count was 8100,  hemoglobin 12.8,  and hematocrit of 37.8.  The remaining studies were pending at the time of  this dictation.   IMPRESSION:  1. Chest pain, rule out unstable angina.  2. Coronary artery disease, status post coronary artery bypass graft surgery     x5, graft in 1991. Status post re-do coronary artery bypass surgery x4,     graft in 2000.  Status post stent to the right internal mammary artery     (posterior descending artery graft in June, 2003).  3. Hypertension.  4. Hyperlipidemia.  5. Non insulin dependent diabetes mellitus.  6. 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    PLAN:  1. Telemetry.  2. Serial cardiac enzymes.  3. Aspirin.  4. Nitrol paste.  5. Lovenox.  6. Hold Atenolol and Glucophage.  7. Nothing by mouth.  8. Further evaluation per Dr. Rex Kras.                                                Virgilio Belling. Ezzie Dural, MD    MSC/MEDQ  D:  08/26/2003  T:  08/27/2003  Job:  [id number]

## 2011-05-06 NOTE — Discharge Summary (Signed)
NAME:  Allen Medina, Allen Medina                        ACCOUNT NO.:  [id number]   MEDICAL RECORD NO.:  [id number]                   PATIENT TYPE:  INP   LOCATION:  [id number]                                 FACILITY:  [facility]   PHYSICIAN:  Merri Ray. Grandville Silos, M.D.             DATE OF BIRTH:  Oct 23, 1941   DATE OF ADMISSION:  10/08/2002  DATE OF DISCHARGE:  10/25/2002                                 DISCHARGE SUMMARY   DISCHARGE DIAGNOSES:  Hepatic abscess.   HISTORY OF PRESENT ILLNESS:  The patient is a 70 year old male who underwent  laparoscopic cholecystectomy in late September.  He developed progressive  cough and shortness of breath and was evaluated by his primary care  physician and diagnosed with atypical pneumonia and treated as an outpatient  for several days but then he was referred for evaluation at our practice and  he was seen and it was felt he needed to come into the hospital as he had  some significant shortness of breath and cough.   HOSPITAL COURSE:  After admission to the hospital evaluations included CAT  scan of the chest, abdomen, and pelvis which revealed a hepatic abscess.  This was subsequently drained percutaneously.  He was placed on intravenous  antibiotics which was Zosyn initially.  He was followed regarding his  diabetes and atypical pneumonia that he had by Dr. Sherrian Divers and his group and  the hospitalists service.  He tolerated the drainage of his abscess very  well.  He remained on IV antibiotics for several days.  His white count  dropped from initial level of 15 to approaching normal.  His cultures grew  out Klebsiella which was sensitive to Cipro and he was changed to  intravenously.  He had several follow-up CT scans showing gradual resolution  of his abscess.  On one of his follow-up CT scans there was an area above  the main abscess that was inadequately drained.  A second catheter was  placed on October 22, 2002 to drain that area.  The catheters continued  to  drain.  He remained afebrile and hemodynamically stable.  He was switched to  oral Cipro and follow-up CT scan was checked and recommendation of the  radiologist was to leave his drains in and allow him to go home and come  back for a repeat CT scan on November 14 for possible drain removal.  The  patient was instructed on taking care of his drains.  He otherwise had  remained afebrile and hemodynamically stable.  He was given prescription for  Percocet and Cipro and discharged home in stable condition.   DISCHARGE DIET:  ADA.   DISCHARGE MEDICATIONS:  1. Cipro 500 mg p.o. b.i.d.  2. Percocet one p.o. q.6h. p.r.n. pain.   FOLLOW UP:  The patient is to follow up with his repeat CT Scan on November  14 at the hospital here and he is also going to  call my office for a follow-  up appointment in two weeks.                                               Merri Ray Grandville Silos, M.D.    BET/MEDQ  D:  10/31/2002  T:  10/31/2002  Job:  [id number]

## 2011-05-06 NOTE — H&P (Signed)
NAME:  Allen Medina, Allen Medina                        ACCOUNT NO.:  [id number]   MEDICAL RECORD NO.:  [id number]                   PATIENT TYPE:  INP   LOCATION:  [id number]                                 FACILITY:  [facility]   PHYSICIAN:  Merri Ray. Grandville Silos, M.D.             DATE OF BIRTH:  [birth year]   DATE OF ADMISSION:  10/08/2002  DATE OF DISCHARGE:                                HISTORY & PHYSICAL   HISTORY OF PRESENT ILLNESS:  The patient is a 71 year old white male who  underwent laparoscopic cholecystectomy several weeks ago.  Subsequently, he  developed progressive cough and shortness of breath.  He was evaluated by  his primary care physician at [facility] and diagnosed  with an atypical pneumonia.  They have been treating him as an outpatient,  but he continued to worsen.  He was evaluated by one of my partners, Marcello Moores  B. Price, M.D., at our office yesterday and his blood sugar was elevated,  and he was continuing to have some significant shortness of breath, and his  primary care had elected not to admit him, but decision was made yesterday  afternoon that he needed to come into the hospital.  Currently, he complains  of some shortness of breath and dyspnea.  He denies any abdominal  complaints.   PAST MEDICAL HISTORY:  Significant for diabetes and coronary artery disease  for which he had a stent placed in July.   PAST SURGICAL HISTORY:  Includes his laparoscopic cholecystectomy as well as  CABG bypass in 1994 and a CABG bypass in 2000.   CURRENT MEDICATIONS:  1. Glucophage 1000 mg p.o. b.i.d.  2. Glucotrol 10 mg q.d.  3. Lescol XL 80 mg q.d.  4. Diovan HCT 120/12.5 q.d.   REVIEW OF SYSTEMS:  GENERAL:  The patient has some malaise.  CARDIOVASCULAR:  He denies any chest pain but respiratory wise he is complaining of shortness  of breath and coughing. ABDOMINAL:  He denies any complaints. NEUROLOGICAL:  He denies any complaints.  The rest of his review of  systems is negative.   PHYSICAL EXAMINATION:  VITAL SIGNS:  Temperature 99.4, pulse 100,  respirations 28, blood pressure 117/66.  GENERAL:  He is awake and alert and in no acute distress.  NECK:  Supple.  HEENT:  Mucous membranes are moist.  LUNGS:  Heavy mild expiratory wheeze bilaterally.  HEART:  Regular.  ABDOMEN:  Soft and nontender with positive bowel sounds.  His incisions are  healed from surgery.  No evidence of infection.  EXTREMITIES:  Warm.   LABORATORY DATA:  White count 15,100, hemoglobin 10.4, hematocrit 29.8,  platelets 520.  Sodium 135, potassium 4.4, chloride 98, CO2 21, BUN 23,  creatinine 1.1, bilirubin 0.3, alk phos 350, AST 219.    ASSESSMENT:  1. Atypical pneumonia.  The patient will be placed on intravenous Rocephin.     We will get followup chest  x-ray and order him some breathing treatments     and check some followup laboratories.  2. Elevated alkaline phosphatase and AST.  The patient's bilirubin is not     elevated and considering benign nature of his abdominal exam would not     feel further workup with an endoscopic retrograde     cholangiopancreatography is necessary at this time but we will repeat     some of his chemistries and liver function tests to check on this.  3. We will plan to ask the patient's primary medical doctors to consult in     regards to his diabetes and his pneumonia.                                               Merri Ray Grandville Silos, M.D.    BET/MEDQ  D:  10/09/2002  T:  10/09/2002  Job:  [id number]

## 2011-05-06 NOTE — Cardiovascular Report (Signed)
NAME:  Allen, Medina                        ACCOUNT NO.:  [id number]   MEDICAL RECORD NO.:  [id number]                   PATIENT TYPE:  OBV   LOCATION:  3709                                 FACILITY:  [facility]   PHYSICIAN:  Jeanella Craze. Little, M.D.              DATE OF BIRTH:  Dec 18, 1941   DATE OF PROCEDURE:  08/28/2003  DATE OF DISCHARGE:                              CARDIAC CATHETERIZATION   INDICATIONS FOR TEST:  Mr. Allen Medina is a 70 year old male who had bypass  surgery in 1991 and repeat bypass surgery in 2000.  He had had a stent  placed to the free Sebastian graft to his PDA May 22, 2002.  He presented with  chest pain and pressure that lasted 20 minutes off and on and was  intermittent with no real response to nitroglycerin.  His cardiac enzymes  were unremarkable but because of his continued chest pain and the concern  that this was angina he was brought to the catheterization laboratory for  cardiac catheterization and graft visualization.  He has also noticed a  rather substantial sharp increase in his blood pressure for no apparent  reason despite good control in the recent past.   PROCEDURE:  The patient was prepped and draped in the usual sterile fashion  exposing the right groin.  Following local anesthetic with 1% Xylocaine the  Seldinger technique was employed and the 5-French introducer sheath was  placed into the right femoral artery.  Left and right coronary  arteriography, graft visualization was performed.  A ventriculogram and a  distal aortogram was also performed.   COMPLICATIONS:  None.   EQUIPMENT:  5-French Judkins configuration catheters were used.  However,  the free RIMA had to be cannulated with a right coronary bypass graft.   RESULTS:  HEMODYNAMIC MONITORING:  Central aortic pressure was 157/71.  Left ventricular pressure was 158/3  with no aortic valve gradient noted at time of pullback.   VENTRICULOGRAPHY:  Ventriculography in the RAO projection  using 25 mL of contrast at 12 mL per  second was associated with marked ventricular ectopy.  There was normal wall  motion of all segments.  Ejection fraction was greater than 55%.  I cannot  comment on mitral regurgitation because of the ectopy.  His left ventricular  end-diastolic pressure was 18.   DISTAL AORTOGRAM:  Distal aortogram done slightly above the level of the renal arteries showed  no evidence of abdominal aortic aneurysm, renal artery stenosis, or iliac  disease.   CORONARY ARTERIOGRAPHY:  There was calcification in the distribution of the left main/LAD system.  1. The left main has in its terminal portion a long 80% area of narrowing.     The LAD was not visualized.  2. LAD 100% occluded.  3. Circumflex:  The ongoing circumflex was faintly visualized.  There was     diffuse disease in the circumflex to __________ in the AV  groove and one     small high OM branch came off that was also diffusely diseased.  4. Right coronary artery:  Diffusely diseased with high grade mid and distal     lesions.  At the takeoff of the PDA was another 90% area of narrowing and     the proximal portion of the PDA was diffusely diseased (the PDA actually     filled via the free RIMA).   GRAFT VISUALIZATION:  1. Saphenous vein graft to the diagonal:  There was minor irregularities in     the proximal portion of the graft.  The diagonal itself was well     visualized with no significant obstruction.  2. Saphenous vein graft sequentially to OM #2 and OM #3:  The graft was     widely patent.  OM #2 and OM #3 were free of significant disease.  3. The old saphenous vein graft from 1991 to OM #1:  The graft was actually     patent.  The OM #1 was small with moderate irregularities.  4. Internal mammary artery to the LAD:  The internal mammary artery was very     large and free of disease.  It inserted into the distal portion of the     LAD and the distal LAD was widely patent across the apex of  the heart and     gave rise to collaterals to the right coronary artery.  Retrograde     filling of the LAD was seen and there was an area of 70% narrowing in the     mid LAD.  This area is not approachable by percutaneous intervention.  5. Free RIMA to the PDA:  This area is not marked with a graft with a     marker.  There is a small clip lateral to the lowest saphenous vein graft     marker and the graft comes off near the clip.  The free RIMA was widely     patent.  The stent had no areas of narrowing and there was minimal     irregularity in the [place].  The PDA was small with irregularities, but no     high grade stenosis.   CONCLUSION:  1. Normal left ventricular systolic function.  2. Patent grafts to all vessels with some small vessel disease.  I am     slightly concerned about the proximal portion of the left anterior     descending that fills retrograde via the internal mammary artery.  It     cannot be approached with percutaneous intervention.  However, it has not     changed in appearance from his catheterization from May 22, 2002.   PLAN:  I do not think his pain is cardiac in origin based on the coronary  anatomy found at catheterization.  I will discharge him later today with  follow-up in my office in three days.  For now his medications will be the  same as at admission except for the Glucophage.  If his blood pressure  continues to be labile, will clearly need additional antihypertensive drugs.                                               Jeanella Craze. Little, M.D.    ABL/MEDQ  D:  08/28/2003  T:  08/28/2003  Job:  [id number]   cc:   Cath Lab   Elayne Snare, M.D.  [address] [place]  [place] [address]  Fax: [phone]

## 2011-10-05 ENCOUNTER — Other Ambulatory Visit: Payer: Self-pay | Admitting: Gastroenterology

## 2011-10-07 ENCOUNTER — Ambulatory Visit
Admission: RE | Admit: 2011-10-07 | Discharge: 2011-10-07 | Disposition: A | Discharge status: Home / Self Care | Payer: Medicare Other | Source: Ambulatory Visit | Attending: Gastroenterology | Admitting: Gastroenterology

## 2011-10-07 MED ORDER — IOHEXOL 300 MG/ML  SOLN
80.0000 mL | Freq: Once | INTRAMUSCULAR | Status: AC | PRN
  Administered 2011-10-07: 80 mL via INTRAVENOUS

## 2011-12-20 HISTORY — PX: CYSTOSCOPY W/ STONE MANIPULATION: SHX1427

## 2012-02-09 ENCOUNTER — Other Ambulatory Visit: Payer: Self-pay | Admitting: Internal Medicine

## 2012-02-09 DIAGNOSIS — N179 Acute kidney failure, unspecified: Secondary | ICD-10-CM

## 2012-02-10 ENCOUNTER — Ambulatory Visit
Admission: RE | Admit: 2012-02-10 | Discharge: 2012-02-10 | Disposition: A | Discharge status: Home / Self Care | Payer: Medicare Other | Source: Ambulatory Visit | Attending: Internal Medicine | Admitting: Internal Medicine

## 2012-02-10 ENCOUNTER — Encounter (HOSPITAL_COMMUNITY): Payer: Self-pay | Admitting: *Deleted

## 2012-02-10 ENCOUNTER — Ambulatory Visit (HOSPITAL_COMMUNITY): Payer: Medicare Other | Admitting: *Deleted

## 2012-02-10 ENCOUNTER — Ambulatory Visit (HOSPITAL_COMMUNITY): Payer: Medicare Other

## 2012-02-10 ENCOUNTER — Other Ambulatory Visit: Payer: Self-pay | Admitting: Urology

## 2012-02-10 ENCOUNTER — Ambulatory Visit (HOSPITAL_COMMUNITY)
Admission: AD | Admit: 2012-02-10 | Discharge: 2012-02-10 | Disposition: A | Discharge status: Home / Self Care | Payer: Medicare Other | Source: Ambulatory Visit | Attending: Urology | Admitting: Urology

## 2012-02-10 ENCOUNTER — Encounter (HOSPITAL_COMMUNITY)
Admission: AD | Disposition: A | Discharge status: Home / Self Care | Payer: Self-pay | Source: Ambulatory Visit | Attending: Urology

## 2012-02-10 DIAGNOSIS — N289 Disorder of kidney and ureter, unspecified: Secondary | ICD-10-CM | POA: Insufficient documentation

## 2012-02-10 DIAGNOSIS — K900 Celiac disease: Secondary | ICD-10-CM | POA: Insufficient documentation

## 2012-02-10 DIAGNOSIS — I252 Old myocardial infarction: Secondary | ICD-10-CM | POA: Insufficient documentation

## 2012-02-10 DIAGNOSIS — N1330 Unspecified hydronephrosis: Secondary | ICD-10-CM | POA: Insufficient documentation

## 2012-02-10 DIAGNOSIS — N201 Calculus of ureter: Secondary | ICD-10-CM | POA: Insufficient documentation

## 2012-02-10 DIAGNOSIS — E7800 Pure hypercholesterolemia, unspecified: Secondary | ICD-10-CM | POA: Insufficient documentation

## 2012-02-10 DIAGNOSIS — E119 Type 2 diabetes mellitus without complications: Secondary | ICD-10-CM | POA: Insufficient documentation

## 2012-02-10 DIAGNOSIS — I10 Essential (primary) hypertension: Secondary | ICD-10-CM | POA: Insufficient documentation

## 2012-02-10 DIAGNOSIS — Z79899 Other long term (current) drug therapy: Secondary | ICD-10-CM | POA: Insufficient documentation

## 2012-02-10 DIAGNOSIS — N179 Acute kidney failure, unspecified: Secondary | ICD-10-CM

## 2012-02-10 DIAGNOSIS — Z951 Presence of aortocoronary bypass graft: Secondary | ICD-10-CM | POA: Insufficient documentation

## 2012-02-10 DIAGNOSIS — H409 Unspecified glaucoma: Secondary | ICD-10-CM | POA: Insufficient documentation

## 2012-02-10 HISTORY — DX: Acute myocardial infarction, unspecified: I21.9

## 2012-02-10 HISTORY — DX: Presence of aortocoronary bypass graft: Z95.1

## 2012-02-10 LAB — GLUCOSE, CAPILLARY

## 2012-02-10 LAB — SURGICAL PCR SCREEN: MRSA, PCR: NEGATIVE

## 2012-02-10 SURGERY — CYSTOURETEROSCOPY, WITH RETROGRADE PYELOGRAM AND STENT INSERTION
Anesthesia: General | Laterality: Left | Wound class: Clean Contaminated

## 2012-02-10 MED ORDER — IOHEXOL 300 MG/ML  SOLN
INTRAMUSCULAR | Status: AC
  Filled 2012-02-10: qty 1

## 2012-02-10 MED ORDER — STERILE WATER FOR IRRIGATION IR SOLN
Status: DC | PRN
  Administered 2012-02-10: 3000 mL

## 2012-02-10 MED ORDER — PROMETHAZINE HCL 25 MG/ML IJ SOLN: 6.2500 mg | INTRAMUSCULAR | Status: DC | PRN

## 2012-02-10 MED ORDER — LIDOCAINE HCL 1 % IJ SOLN
INTRAMUSCULAR | Status: DC | PRN
  Administered 2012-02-10: 50 mg via INTRADERMAL

## 2012-02-10 MED ORDER — FENTANYL CITRATE 0.05 MG/ML IJ SOLN: 25.0000 ug | INTRAMUSCULAR | Status: DC | PRN

## 2012-02-10 MED ORDER — LIDOCAINE HCL 2 % EX GEL
CUTANEOUS | Status: AC
  Filled 2012-02-10: qty 10

## 2012-02-10 MED ORDER — BELLADONNA ALKALOIDS-OPIUM 16.2-60 MG RE SUPP
RECTAL | Status: DC | PRN
  Administered 2012-02-10: 1 via RECTAL

## 2012-02-10 MED ORDER — CIPROFLOXACIN IN D5W 400 MG/200ML IV SOLN
INTRAVENOUS | Status: DC | PRN
  Administered 2012-02-10: 400 mg via INTRAVENOUS

## 2012-02-10 MED ORDER — MUPIROCIN 2 % EX OINT
TOPICAL_OINTMENT | CUTANEOUS | Status: AC
  Filled 2012-02-10: qty 22

## 2012-02-10 MED ORDER — ACETAMINOPHEN 10 MG/ML IV SOLN
INTRAVENOUS | Status: AC
  Filled 2012-02-10: qty 100

## 2012-02-10 MED ORDER — CIPROFLOXACIN IN D5W 400 MG/200ML IV SOLN
INTRAVENOUS | Status: AC
  Filled 2012-02-10: qty 200

## 2012-02-10 MED ORDER — HYOSCYAMINE SULFATE 0.125 MG SL SUBL: 0.1250 mg | SUBLINGUAL_TABLET | SUBLINGUAL | Status: AC | PRN

## 2012-02-10 MED ORDER — MEPERIDINE HCL 50 MG/ML IJ SOLN: 6.2500 mg | INTRAMUSCULAR | Status: DC | PRN

## 2012-02-10 MED ORDER — CIPROFLOXACIN IN D5W 400 MG/200ML IV SOLN: 400.0000 mg | INTRAVENOUS | Status: DC

## 2012-02-10 MED ORDER — LACTATED RINGERS IV SOLN
INTRAVENOUS | Status: DC
  Administered 2012-02-10: 1000 mL via INTRAVENOUS

## 2012-02-10 MED ORDER — ACETAMINOPHEN 10 MG/ML IV SOLN
INTRAVENOUS | Status: DC | PRN
  Administered 2012-02-10: 1000 mg via INTRAVENOUS

## 2012-02-10 MED ORDER — ONDANSETRON HCL 4 MG/2ML IJ SOLN
INTRAMUSCULAR | Status: DC | PRN
  Administered 2012-02-10 (×2): 2 mg via INTRAVENOUS

## 2012-02-10 MED ORDER — BELLADONNA ALKALOIDS-OPIUM 16.2-60 MG RE SUPP
RECTAL | Status: AC
  Filled 2012-02-10: qty 1

## 2012-02-10 MED ORDER — SODIUM CHLORIDE 0.9 % IV SOLN
INTRAVENOUS | Status: DC | PRN
  Administered 2012-02-10: 16:00:00 via INTRAVENOUS

## 2012-02-10 MED ORDER — PROPOFOL 10 MG/ML IV EMUL
INTRAVENOUS | Status: DC | PRN
  Administered 2012-02-10: 150 mg via INTRAVENOUS

## 2012-02-10 MED ORDER — MIDAZOLAM HCL 5 MG/5ML IJ SOLN
INTRAMUSCULAR | Status: DC | PRN
  Administered 2012-02-10: 1 mg via INTRAVENOUS

## 2012-02-10 MED ORDER — PHENAZOPYRIDINE HCL 200 MG PO TABS: 200.0000 mg | ORAL_TABLET | Freq: Three times a day (TID) | ORAL | Status: AC | PRN

## 2012-02-10 MED ORDER — LACTATED RINGERS IV SOLN: INTRAVENOUS | Status: DC

## 2012-02-10 MED ORDER — FENTANYL CITRATE 0.05 MG/ML IJ SOLN
INTRAMUSCULAR | Status: DC | PRN
  Administered 2012-02-10: 50 ug via INTRAVENOUS

## 2012-02-10 MED ORDER — OXYCODONE-ACETAMINOPHEN 5-325 MG PO TABS: 1.0000 | ORAL_TABLET | ORAL | Status: DC | PRN

## 2012-02-10 SURGICAL SUPPLY — 14 items
BAG URO CATCHER STRL LF (DRAPE) ×2 IMPLANT
CATH URET 5FR 28IN OPEN ENDED (CATHETERS) ×2 IMPLANT
CLOTH BEACON ORANGE TIMEOUT ST (SAFETY) ×2 IMPLANT
DRAPE CAMERA CLOSED 9X96 (DRAPES) ×2 IMPLANT
GLOVE SURG SS PI 8.0 STRL IVOR (GLOVE) ×2 IMPLANT
GOWN PREVENTION PLUS XLARGE (GOWN DISPOSABLE) ×2 IMPLANT
GOWN STRL REIN XL XLG (GOWN DISPOSABLE) ×2 IMPLANT
MANIFOLD NEPTUNE II (INSTRUMENTS) ×2 IMPLANT
MARKER SKIN DUAL TIP RULER LAB (MISCELLANEOUS) ×2 IMPLANT
PACK CYSTO (CUSTOM PROCEDURE TRAY) ×2 IMPLANT
SHEATH ACCESS URETERAL 38CM (SHEATH) ×1 IMPLANT
STENT CONTOUR URETERAL (STENTS) ×1 IMPLANT
TUBING CONNECTING 10 (TUBING) ×2 IMPLANT
URETERAL STENT ×1 IMPLANT

## 2012-02-10 NOTE — H&P (Signed)
ems Problems  1. Acute Renal Insufficiency 593.9 2. Hydronephrosis On The Left 591 3. Ureteral Stone Left 592.1  History of Present Illness     Mr. Allen Medina is a 71 yo WM sent in consultation by Dr. Lang Snow for a possible kidney stone.  He started having severe abdominal pain and diarrhea last week in [facility].   He was told to return home and was set up for a renal US that showed left hydro and a possible 1cm left ureteral stone.  He still has some pain but it is not as severe.  He has had no hematuria.  He has nocturia with small voids.  He has some urgency but no incontinence.  He has a slow stream with small voids.  He has had prior stones but no surgery.  He has no other GU history.  He has renal insufficiency and a recent creatinine of 4.4.  He reports a CT scan done by Dr. Collene Mares in October as well.   Past Medical History Problems  1. History of  Acute Myocardial Infarction V12.59 2. History of  Acute Renal Failure 584.9 3. 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 History of  Diabetes Mellitus 250.00 5. History of  Glaucoma 365.9 6. History of  Heart Disease 429.9 7. History of  Hypercholesterolemia 272.0 8. History of  Hypertension 401.9  Surgical History Problems  1. History of  Back Surgery 2. History of  Back Surgery 3. 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 History of  Cath Stent Placement 5. History of  Cholecystectomy 6. History of  Heart Surgery  Current Meds 1. AmLODIPine Besylate 5 MG Oral Tablet; Therapy: (Recorded:22Feb2013) to 2. Colon Care CAPS; Therapy: (Recorded:22Feb2013) to 3. Crestor 40 MG Oral Tablet; Therapy: (Recorded:22Feb2013) to 4. GlipiZIDE 10 MG Oral Tablet; Therapy: (Recorded:22Feb2013) to 5. Janumet 50-1000 MG Oral Tablet; Therapy: (Recorded:22Feb2013) to 6. LORazepam 0.5 MG Oral Tablet; Therapy: (Recorded:22Feb2013) to 7. Lyrica 100 MG Oral Capsule; Therapy: (Recorded:22Feb2013) to 8. MetroNIDAZOLE 500 MG Oral Tablet; Therapy: 22Feb2013  to 9. Micardis HCT 80-25 MG Oral Tablet; Therapy: (Recorded:22Feb2013) to 10. Vitamin B-12 TABS; Therapy: (Recorded:22Feb2013) to 11. Zolpidem Tartrate 10 MG Oral Tablet; Therapy: (Recorded:22Feb2013) to  Allergies Medication  1. No Known Drug Allergies  Family History Problems  1. Family history of  Death In The Family Father 2. Family history of  Death In The Family Mother 3. Paternal history of  Diabetes Mellitus V18.0 4. Family history of  Family Health Status Number Of Children 5. Maternal history of  Gout V18.19 6. Paternal history of  Heart Disease V17.49  Social History Problems    Caffeine Use   Marital History - Currently Married   Never A Smoker   Retired From Work Denied    History of  Alcohol Use  Review of Systems Genitourinary, constitutional, skin, eye, otolaryngeal, hematologic/lymphatic, cardiovascular, pulmonary, endocrine, musculoskeletal, gastrointestinal, neurological and psychiatric system(s) were reviewed and pertinent findings if present are noted.  Genitourinary: nocturia, difficulty starting the urinary stream, weak urinary stream, urinary stream starts and stops and erectile dysfunction.  Gastrointestinal: abdominal pain.  Constitutional: feeling tired (fatigue) and recent 11 lb weight loss.  Integumentary: pruritus.  Respiratory: shortness of breath.  Musculoskeletal: back pain.  Neurological: dizziness.    Vitals Vital Signs [Data Includes: Last 1 Day]  22Feb2013 10:40AM  BMI Calculated: 29.66 BSA Calculated: 1.97 Height: 5 ft 7 in Weight: 189 lb  Blood Pressure: 149 / 76 Temperature: 97 F Heart Rate: 73  Physical Exam Constitutional: Well nourished and well developed . No acute distress.  ENT:. The ears and nose are normal in appearance.  Neck: The appearance of the neck is normal and no neck mass is present.  Pulmonary: No respiratory distress and normal respiratory rhythm and effort.  Cardiovascular: Heart rate and rhythm are  normal . No peripheral edema.  Abdomen: The abdomen is soft and nontender. No masses are palpated. Mild tenderness in the LLQ is present. No CVA tenderness. No hernias are palpable. No hepatosplenomegaly noted.  Lymphatics: The femoral and inguinal nodes are not enlarged or tender.  Skin: Normal skin turgor, no visible rash and no visible skin lesions.  Neuro/Psych:. Mood and affect are appropriate.    Results/Data Urine [Data Includes: Last 1 Day]   22Feb2013  COLOR YELLOW   APPEARANCE CLEAR   SPECIFIC GRAVITY 1.020   pH 5.0   GLUCOSE NEG mg/dL  BILIRUBIN NEG   KETONE NEG mg/dL  BLOOD TRACE   PROTEIN NEG mg/dL  UROBILINOGEN 0.2 mg/dL  NITRITE NEG   LEUKOCYTE ESTERASE NEG   SQUAMOUS EPITHELIAL/HPF NONE SEEN   WBC 0-3 WBC/hpf  RBC 0-3 RBC/hpf  BACTERIA NONE SEEN   CRYSTALS NONE SEEN   CASTS NONE SEEN    Old records or history reviewed: I have reviewed records and labs from Dr. Brigitte Pulse.  The following images/tracing/specimen were independently visualized:  I have reviewed his CT from 10/12 and his left renal stone has 462HU. There was a 50mm stone in the LLP. I have reviewed his [place] from today and he has left hydro with the stone in the mid ureter. KUB today does not show the stone which probably uric acid based on the CT density measurements. He has calcified SV's. No other significant findings are noted.  The following clinical lab reports were reviewed:  Outside chemistries reviewed.    Assessment Assessed  1. Ureteral Stone Left 592.1 2. Hydronephrosis On The Left 591 3. Acute Renal Insufficiency 593.9   He has a left ureteral stone that is probably uric acid with hydro and renal insufficiency.   Plan Health Maintenance (V70.0)  1. UA With REFLEX  Done: [id number] 10:45AM Ureteral Stone (592.1)  2. KUB  Done: 22Feb2013 12:00AM 3. Follow-up Schedule Surgery Office  Follow-up  Done: 22Feb2013   I am going to set him up for cystoscopy with a left retrograde pyelogram, left  ureteroscopy and probable stent.  I have reviewed the risks of bleeding, infection, ureteral injury, need for a stent or secondary procedures, thrombotic events and anesthetic complications. We will get that done today.

## 2012-02-10 NOTE — Discharge Instructions (Signed)
Ureteral Stent A ureteral stent is a soft plastic tube with multiple holes. The stent is inserted into a ureter to help drain urine from the kidney into the bladder. The tube has a coil on each end to keep it from falling out. One end stays in the kidney. The other end stays in the bladder. A stent cannot be seen from the outside. Usually it does not keep you from going about normal routines. A ureteral stent is used to bypass a blockage in your kidney or ureter. This blockage can be caused by kidney stones, scar tissue, pregnancy, or other causes. It can also be used during treatment to remove a kidney stone or to let a ureter heal after surgery. The stent allows urine to drain from the kidney into the bladder. It is most often taken out after the blockage has been removed or the ureter has healed. If a stent is needed for a long time, it will be changed every few months. INSERTING THE STENT Your stent is put in by a urologist. This is a medical doctor trained for treating genitourinary (kidney, ureter and bladder) problems. Before your stent is put in, your caregiver may order x-rays or other imaging tests of your kidneys and ureters. The stent is inserted in a hospital or same day surgical center. You can anticipate going home the same day. PROCEDURE  A special x-ray machine called a fluoroscope is used to guide the insertion of your stent. This allows your doctor to make sure the stent is in the correct place.   First you are given anesthesia to keep you comfortable.   Then your doctor inserts a special lighted instrument called a cystoscope into your bladder. This allows your doctor to see the opening to the ureter.   A thin wire is carefully threaded into the bladder and up the ureter. The stent is inserted over the wire and the wire is then removed.  HOME CARE INSTRUCTIONS   While the stent is in place, you may feel some discomfort. Certain movements may trigger pain or a feeling that you need  to urinate. Your caregiver may give you pain medication. Only take over-the-counter or prescription medicines for pain, discomfort, or fever as directed by your caregiver. Do not take aspirin as this can make bleeding worse.   You may be given medications to prevent infection or bladder spasms. Be sure to take all medications as directed.   Drink plenty of fluids.   You may have small amounts of bleeding causing your urine to be slightly red. This is nothing to be concerned about.  REMOVAL OF THE STENT Your stent is left in until the blockage is resolved. This may take two weeks or longer. Before the stent is removed, you may have an x-ray make sure the ureter is open. The stent can be removed by your caregiver in the office. Medications may be given for comfort. Be sure to keep all follow-up appointments so your caregiver can check that you are healing properly. SEEK IMMEDIATE MEDICAL CARE IF:   Your urine is dark red or has blood clots.   You are incontinent (leaking urine).   You have an oral temperature above 102 F (38.9 C), chills, nausea (feeling sick to your stomach), or vomiting.   Your pain is not relieved by pain medication. Do not take aspirin as this can make bleeding worse.   The end of the stent comes out of the urethra.  Document Released: 12/02/2000 Document Revised:  08/17/2011 Document Reviewed: 12/01/2008 [facility] Patient Information 2012 [place].

## 2012-02-10 NOTE — Anesthesia Postprocedure Evaluation (Signed)
  Anesthesia Post-op Note  Patient: Allen Medina  Procedure(s) Performed: Procedure(s) (LRB): CYSTOSCOPY WITH RETROGRADE PYELOGRAM, URETEROSCOPY AND STENT PLACEMENT (Left)  Patient Location: PACU  Anesthesia Type: General  Level of Consciousness: awake and alert   Airway and Oxygen Therapy: Patient Spontanous Breathing  Post-op Pain: mild  Post-op Assessment: Post-op Vital signs reviewed, Patient's Cardiovascular Status Stable, Respiratory Function Stable, Patent Airway and No signs of Nausea or vomiting  Post-op Vital Signs: stable  Complications: No apparent anesthesia complications

## 2012-02-10 NOTE — Preoperative (Signed)
Beta Blockers   Reason not to administer Beta Blockers:Not Applicable 

## 2012-02-10 NOTE — Progress Notes (Signed)
Radiology called and stated that due to the chest xray just taken on this patient their recommendation was to have a follow up CT scan. Called anesthesia (Dr. Aris Lot) and informed him as patient is currently on the way to the holding area / OR.

## 2012-02-10 NOTE — Transfer of Care (Signed)
Immediate Anesthesia Transfer of Care Note  Patient: Allen Medina  Procedure(s) Performed: Procedure(s) (LRB): CYSTOSCOPY WITH RETROGRADE PYELOGRAM, URETEROSCOPY AND STENT PLACEMENT (Left)  Patient Location: PACU  Anesthesia Type: General  Level of Consciousness: awake, oriented and patient cooperative  Airway & Oxygen Therapy: Patient Spontanous Breathing and Patient connected to face mask oxygen  Post-op Assessment: Report given to PACU RN, Post -op Vital signs reviewed and stable and Patient moving all extremities  Post vital signs: Reviewed and stable  Complications: No apparent anesthesia complications

## 2012-02-10 NOTE — Brief Op Note (Signed)
02/10/2012  5:15 PM  PATIENT:  Allen Medina  71 y.o. male  PRE-OPERATIVE DIAGNOSIS:  ureteral obstruction with a left ureteral stone.  POST-OPERATIVE DIAGNOSIS:  ureteral obstruction with a left proximal ureteral stone.  PROCEDURE:  Procedure(s) (LRB): CYSTOSCOPY WITH RETROGRADE PYELOGRAM AND STENT PLACEMENT (Left)  SURGEON:  Surgeon(s) and Role:    * Malka So, MD - Primary  PHYSICIAN ASSISTANT:   ASSISTANTS: none   ANESTHESIA:   general  EBL:     BLOOD ADMINISTERED:none  DRAINS: Left 6x26 JJ stent   LOCAL MEDICATIONS USED:  NONE  SPECIMEN:  No Specimen  DISPOSITION OF SPECIMEN:  N/A  COUNTS:  YES  TOURNIQUET:  * No tourniquets in log *  DICTATION: .Other Dictation: Dictation Number [id number]  PLAN OF CARE: Discharge to home after PACU  PATIENT DISPOSITION:  PACU - hemodynamically stable.   Delay start of Pharmacological VTE agent (>24hrs) due to surgical blood loss or risk of bleeding: not applicable

## 2012-02-10 NOTE — Anesthesia Preprocedure Evaluation (Signed)
Anesthesia Evaluation  Patient identified by MRN, date of birth, ID band Patient awake    Reviewed: Allergy & Precautions, H&P , NPO status , Patient's Chart, lab work & pertinent test results  Airway Mallampati: II TM Distance: >3 FB Neck ROM: Full    Dental No notable dental hx.    Pulmonary neg pulmonary ROS,  clear to auscultation  Pulmonary exam normal       Cardiovascular hypertension, Pt. on medications + CAD (s/p cabg 1991, 2000. Stent 2003), + Past MI and neg cardio ROS Regular Normal    Neuro/Psych Negative Neurological ROS  Negative Psych ROS   GI/Hepatic negative GI ROS, Neg liver ROS,   Endo/Other  Negative Endocrine ROSDiabetes mellitus-, Type 2, Oral Hypoglycemic Agents  Renal/GU negative Renal ROS  Genitourinary negative   Musculoskeletal negative musculoskeletal ROS (+)   Abdominal   Peds negative pediatric ROS (+)  Hematology negative hematology ROS (+)   Anesthesia Other Findings   Reproductive/Obstetrics negative OB ROS                           Anesthesia Physical Anesthesia Plan  ASA: II  Anesthesia Plan: General   Post-op Pain Management:    Induction: Intravenous  Airway Management Planned:   Additional Equipment:   Intra-op Plan:   Post-operative Plan: Extubation in OR  Informed Consent: I have reviewed the patients History and Physical, chart, labs and discussed the procedure including the risks, benefits and alternatives for the proposed anesthesia with the patient or authorized representative who has indicated his/her understanding and acceptance.   Dental advisory given  Plan Discussed with: CRNA  Anesthesia Plan Comments:         Anesthesia Quick Evaluation

## 2012-02-11 NOTE — Op Note (Signed)
NAMEBRYAM, Allen Medina              ACCOUNT NO.:  [id number]  MEDICAL RECORD NO.:  [id number]  LOCATION:  [facility]                         FACILITY:  [facility]  PHYSICIAN:  Marshall Cork. Jeffie Pollock, M.D.    DATE OF BIRTH:  29-Apr-1941  DATE OF PROCEDURE:  02/10/2012 DATE OF DISCHARGE:  02/10/2012                              OPERATIVE REPORT   PROCEDURES:  Cystoscopy, left retrograde pyelogram with interpretation, and insertion of left double-J stent.  PREOPERATIVE DIAGNOSIS:  Left ureteral stone with obstruction.  POSTOPERATIVE DIAGNOSIS:  Left proximal ureteral stone with obstruction.  SURGEON:  Marshall Cork. Jeffie Pollock, M.D.  ANESTHESIA:  General.  SPECIMEN:  None.  DRAINS:  6-French x 26 cm left double-J stent.  COMPLICATIONS:  None.  INDICATIONS:  [name] is a 71 year old white male, who was seen in consultation today with Dr. Brigitte Pulse for a left renal stone with hydronephrosis and acute renal insufficiency with a creatinine of 4.4. He had had pain associated with this and was felt that the immediate intervention with attempted ureteroscopy was indicated.  FINDINGS OF PROCEDURE:  He was taken to the operating room, where general anesthetic was induced.  He was given Cipro.  He was placed in lithotomy position.  His perineum and genitalia were prepped with Betadine solution.  He was draped in usual sterile fashion.  Cystoscopy was performed using a 22-French scope and 12 degree lens.  Examination revealed a normal urethra.  The external sphincter was intact.  The prostatic urethra showed without significant obstruction.  Examination of bladder revealed mild trabeculation.  No tumors or inflammation were noted.  There was a small amount of tannish stone material at the base of the bladder.  Ureteral orifices were unremarkable.  Left ureteral orifice was cannulated with 5-French open-end catheter. Contrast was instilled.  This revealed a delicate distal ureter with a filling defect at the level of  the L2-L3 interspace consistent with a stone seen on CT and ultrasound.  After the position of the stone was confirmed, a guidewire was passed through the opening catheter by the stone into the kidney.  The open-end catheter was advanced by the stone.  The wire was removed.  There was a brisk hydronephrotic drip confirming intrarenal location.  The wire was then replaced, the open-end catheter was removed and the cystoscope was removed.  A 12-French introducer sheath inner core dilator was then passed over the wire, but it would not go up the distal ureter without excessive force, so I elected to back away from dilation at this time.  The cystoscope was reinserted over the wire and a 6-French 26 cm double- J stent was placed without difficulty under fluoroscopic guidance.  The wire was removed leaving coiled in the kidney and coiled in the bladder. There was efflux of turbid urine from the stent holes in the bladder.  The bladder was drained.  B and O suppository was placed.  The patient was taken down from lithotomy position.  His anesthetic was reversed. He was moved to recovery room in stable condition.  There were no complications.  He will be set up for subsequent ureteroscopy later next week.     Marshall Cork. Jeffie Pollock, M.D.  JJW/MEDQ  D:  02/10/2012  T:  02/11/2012  Job:  [id number]  cc:   Janalyn Rouse, M.D. Fax: [phone]

## 2012-02-13 ENCOUNTER — Other Ambulatory Visit: Payer: Self-pay | Admitting: Urology

## 2012-02-13 ENCOUNTER — Encounter (HOSPITAL_COMMUNITY): Payer: Self-pay | Admitting: *Deleted

## 2012-02-15 ENCOUNTER — Encounter (HOSPITAL_COMMUNITY): Payer: Self-pay | Admitting: *Deleted

## 2012-02-16 ENCOUNTER — Encounter (HOSPITAL_COMMUNITY)
Admission: RE | Disposition: A | Discharge status: Home / Self Care | Payer: Self-pay | Source: Ambulatory Visit | Attending: Urology

## 2012-02-16 ENCOUNTER — Ambulatory Visit (HOSPITAL_COMMUNITY): Payer: Medicare Other | Admitting: Registered Nurse

## 2012-02-16 ENCOUNTER — Encounter (HOSPITAL_COMMUNITY): Payer: Self-pay | Admitting: Registered Nurse

## 2012-02-16 ENCOUNTER — Ambulatory Visit (HOSPITAL_COMMUNITY)
Admission: RE | Admit: 2012-02-16 | Discharge: 2012-02-16 | Disposition: A | Discharge status: Home / Self Care | Payer: Medicare Other | Source: Ambulatory Visit | Attending: Urology | Admitting: Urology

## 2012-02-16 ENCOUNTER — Encounter (HOSPITAL_COMMUNITY): Payer: Self-pay | Admitting: *Deleted

## 2012-02-16 DIAGNOSIS — E119 Type 2 diabetes mellitus without complications: Secondary | ICD-10-CM | POA: Insufficient documentation

## 2012-02-16 DIAGNOSIS — I10 Essential (primary) hypertension: Secondary | ICD-10-CM | POA: Insufficient documentation

## 2012-02-16 DIAGNOSIS — N201 Calculus of ureter: Secondary | ICD-10-CM | POA: Insufficient documentation

## 2012-02-16 DIAGNOSIS — Z951 Presence of aortocoronary bypass graft: Secondary | ICD-10-CM | POA: Insufficient documentation

## 2012-02-16 DIAGNOSIS — E7800 Pure hypercholesterolemia, unspecified: Secondary | ICD-10-CM | POA: Insufficient documentation

## 2012-02-16 DIAGNOSIS — I252 Old myocardial infarction: Secondary | ICD-10-CM | POA: Insufficient documentation

## 2012-02-16 DIAGNOSIS — N1330 Unspecified hydronephrosis: Secondary | ICD-10-CM | POA: Insufficient documentation

## 2012-02-16 DIAGNOSIS — N289 Disorder of kidney and ureter, unspecified: Secondary | ICD-10-CM | POA: Insufficient documentation

## 2012-02-16 DIAGNOSIS — Z79899 Other long term (current) drug therapy: Secondary | ICD-10-CM | POA: Insufficient documentation

## 2012-02-16 HISTORY — DX: Peripheral vascular disease, unspecified: I73.9

## 2012-02-16 HISTORY — PX: CYSTOSCOPY/RETROGRADE/URETEROSCOPY/STONE EXTRACTION WITH BASKET: SHX5317

## 2012-02-16 LAB — GLUCOSE, CAPILLARY: Glucose-Capillary: 133 mg/dL — ABNORMAL HIGH (ref 70–99)

## 2012-02-16 LAB — CBC
HCT: 29.2 % — ABNORMAL LOW (ref 39.0–52.0)
Hemoglobin: 9.8 g/dL — ABNORMAL LOW (ref 13.0–17.0)
WBC: 7.6 10*3/uL (ref 4.0–10.5)

## 2012-02-16 LAB — BASIC METABOLIC PANEL
BUN: 35 mg/dL — ABNORMAL HIGH (ref 6–23)
CO2: 25 mEq/L (ref 19–32)
Chloride: 105 mEq/L (ref 96–112)
Glucose, Bld: 155 mg/dL — ABNORMAL HIGH (ref 70–99)
Potassium: 5.4 mEq/L — ABNORMAL HIGH (ref 3.5–5.1)

## 2012-02-16 LAB — SURGICAL PCR SCREEN: Staphylococcus aureus: NEGATIVE

## 2012-02-16 SURGERY — CYSTOSCOPY, WITH CALCULUS REMOVAL USING BASKET
Anesthesia: General | Site: Ureter | Wound class: Clean Contaminated

## 2012-02-16 MED ORDER — ONDANSETRON HCL 4 MG/2ML IJ SOLN
INTRAMUSCULAR | Status: DC | PRN
  Administered 2012-02-16: 4 mg via INTRAVENOUS

## 2012-02-16 MED ORDER — SODIUM CHLORIDE 0.9 % IV SOLN
INTRAVENOUS | Status: DC
  Administered 2012-02-16: 13:00:00 via INTRAVENOUS
  Administered 2012-02-16: 1000 mL via INTRAVENOUS

## 2012-02-16 MED ORDER — LACTATED RINGERS IV SOLN: INTRAVENOUS | Status: DC

## 2012-02-16 MED ORDER — MUPIROCIN 2 % EX OINT
TOPICAL_OINTMENT | CUTANEOUS | Status: AC
  Filled 2012-02-16: qty 22

## 2012-02-16 MED ORDER — CIPROFLOXACIN IN D5W 400 MG/200ML IV SOLN
400.0000 mg | INTRAVENOUS | Status: AC
Start: 2012-02-16 — End: 2012-02-16
  Administered 2012-02-16: 400 mg via INTRAVENOUS

## 2012-02-16 MED ORDER — SODIUM CHLORIDE 0.9 % IR SOLN
Status: DC | PRN
  Administered 2012-02-16: 1000 mL

## 2012-02-16 MED ORDER — PROMETHAZINE HCL 25 MG/ML IJ SOLN: 6.2500 mg | INTRAMUSCULAR | Status: DC | PRN

## 2012-02-16 MED ORDER — IOHEXOL 300 MG/ML  SOLN
INTRAMUSCULAR | Status: AC
  Filled 2012-02-16: qty 1

## 2012-02-16 MED ORDER — BELLADONNA ALKALOIDS-OPIUM 16.2-60 MG RE SUPP
RECTAL | Status: DC | PRN
  Administered 2012-02-16: 1 via RECTAL

## 2012-02-16 MED ORDER — FENTANYL CITRATE 0.05 MG/ML IJ SOLN
INTRAMUSCULAR | Status: DC | PRN
  Administered 2012-02-16 (×4): 50 ug via INTRAVENOUS

## 2012-02-16 MED ORDER — OXYCODONE-ACETAMINOPHEN 5-325 MG PO TABS: 1.0000 | ORAL_TABLET | ORAL | Status: AC | PRN

## 2012-02-16 MED ORDER — HYDROMORPHONE HCL PF 1 MG/ML IJ SOLN: 0.2500 mg | INTRAMUSCULAR | Status: DC | PRN

## 2012-02-16 MED ORDER — CIPROFLOXACIN IN D5W 400 MG/200ML IV SOLN
INTRAVENOUS | Status: AC
  Filled 2012-02-16: qty 200

## 2012-02-16 MED ORDER — MIDAZOLAM HCL 5 MG/5ML IJ SOLN
INTRAMUSCULAR | Status: DC | PRN
  Administered 2012-02-16: 1 mg via INTRAVENOUS

## 2012-02-16 MED ORDER — BELLADONNA ALKALOIDS-OPIUM 16.2-60 MG RE SUPP
RECTAL | Status: AC
  Filled 2012-02-16: qty 1

## 2012-02-16 MED ORDER — PROPOFOL 10 MG/ML IV BOLUS
INTRAVENOUS | Status: DC | PRN
  Administered 2012-02-16: 170 mg via INTRAVENOUS

## 2012-02-16 MED ORDER — LIDOCAINE HCL (CARDIAC) 10 MG/ML IV SOLN
INTRAVENOUS | Status: DC | PRN
  Administered 2012-02-16: 100 mg via INTRAVENOUS

## 2012-02-16 MED ORDER — ACETAMINOPHEN 10 MG/ML IV SOLN
INTRAVENOUS | Status: AC
  Filled 2012-02-16: qty 100

## 2012-02-16 MED ORDER — ACETAMINOPHEN 10 MG/ML IV SOLN
INTRAVENOUS | Status: DC | PRN
  Administered 2012-02-16: 1000 mg via INTRAVENOUS

## 2012-02-16 SURGICAL SUPPLY — 18 items
BAG URO CATCHER STRL LF (DRAPE) ×3 IMPLANT
BASKET LASER NITINOL 1.9FR (BASKET) ×3 IMPLANT
BASKET ZERO TIP NITINOL 2.4FR (BASKET) ×1 IMPLANT
BSKT STON RTRVL 120 1.9FR (BASKET) ×2
BSKT STON RTRVL ZERO TP 2.4FR (BASKET) ×2
CATH URET 5FR 28IN OPEN ENDED (CATHETERS) ×3 IMPLANT
CLOTH BEACON ORANGE TIMEOUT ST (SAFETY) ×3 IMPLANT
DRAPE CAMERA CLOSED 9X96 (DRAPES) ×3 IMPLANT
GLOVE SURG SS PI 8.0 STRL IVOR (GLOVE) ×3 IMPLANT
GOWN PREVENTION PLUS XLARGE (GOWN DISPOSABLE) ×3 IMPLANT
GOWN STRL REIN XL XLG (GOWN DISPOSABLE) ×3 IMPLANT
GUIDEWIRE STR DUAL SENSOR (WIRE) ×2 IMPLANT
LASER FIBER DISP (UROLOGICAL SUPPLIES) ×1 IMPLANT
MANIFOLD NEPTUNE II (INSTRUMENTS) ×3 IMPLANT
MARKER SKIN DUAL TIP RULER LAB (MISCELLANEOUS) ×3 IMPLANT
PACK CYSTO (CUSTOM PROCEDURE TRAY) ×3 IMPLANT
SHEATH ACCESS URETERAL 38CM (SHEATH) ×1 IMPLANT
TUBING CONNECTING 10 (TUBING) ×3 IMPLANT

## 2012-02-16 NOTE — Brief Op Note (Signed)
02/16/2012  3:13 PM  PATIENT:  Allen Medina  71 y.o. male  PRE-OPERATIVE DIAGNOSIS:  left proximal stone  POST-OPERATIVE DIAGNOSIS:  left proximal stone  PROCEDURE:  Procedure(s) (LRB): CYSTOSCOPY/RETROGRADE/URETEROSCOPY/STONE EXTRACTION WITH BASKET (Left) HOLMIUM LASER APPLICATION (N/A)  LEFT URETERAL STENTING.  SURGEON:  Surgeon(s) and Role:    * Malka So, MD - Primary  PHYSICIAN ASSISTANT:   ASSISTANTS: none   ANESTHESIA:   general  EBL:  Total I/O In: 1500 [I.V.:1500] Out: 250 [Urine:250]  BLOOD ADMINISTERED:none  DRAINS: LEFT 6x26 JJ stent.   LOCAL MEDICATIONS USED:  NONE  SPECIMEN:  Source of Specimen:  stone given to family to bring to the office.  DISPOSITION OF SPECIMEN:  family.  COUNTS:  YES  TOURNIQUET:  * No tourniquets in log *  DICTATION: .Other Dictation: Dictation Number Didn't get it  PLAN OF CARE: Discharge to home after PACU  PATIENT DISPOSITION:  PACU - hemodynamically stable.   Delay start of Pharmacological VTE agent (>24hrs) due to surgical blood loss or risk of bleeding: no

## 2012-02-16 NOTE — Interval H&P Note (Signed)
History and Physical Interval Note:  02/16/2012 12:21 PM  Allen Medina  has presented today for surgery, with the diagnosis of left proximal stone  The various methods of treatment have been discussed with the patient and family. After consideration of risks, benefits and other options for treatment, the patient has consented to  Procedure(s) (LRB): CYSTOSCOPY/RETROGRADE/URETEROSCOPY/STONE EXTRACTION WITH BASKET (Left) HOLMIUM LASER APPLICATION (N/A) as a surgical intervention .  The patients' history has been reviewed, patient examined, no change in status, stable for surgery.  I have reviewed the patients' chart and labs.  Questions were answered to the patient's satisfaction.     Kanden Carey J

## 2012-02-16 NOTE — Transfer of Care (Signed)
Immediate Anesthesia Transfer of Care Note  Patient: Allen Medina  Procedure(s) Performed: Procedure(s) (LRB): CYSTOSCOPY/RETROGRADE/URETEROSCOPY/STONE EXTRACTION WITH BASKET (Left) HOLMIUM LASER APPLICATION (N/A)  Patient Location: PACU  Anesthesia Type: General  Level of Consciousness: awake, alert , oriented, patient cooperative and responds to stimulation  Airway & Oxygen Therapy: Patient Spontanous Breathing and Patient connected to face mask oxygen  Post-op Assessment: Report given to PACU RN and Post -op Vital signs reviewed and stable  Post vital signs: stable  Complications: No apparent anesthesia complications

## 2012-02-16 NOTE — H&P (View-Only) (Signed)
ems Problems  1. Acute Renal Insufficiency 593.9 2. Hydronephrosis On The Left 591 3. Ureteral Stone Left 592.1  History of Present Illness     Allen Medina is a 71 yo WM sent in consultation by Dr. Lang Snow for a possible kidney stone.  He started having severe abdominal pain and diarrhea last week in [facility].   He was told to return home and was set up for a renal US that showed left hydro and a possible 1cm left ureteral stone.  He still has some pain but it is not as severe.  He has had no hematuria.  He has nocturia with small voids.  He has some urgency but no incontinence.  He has a slow stream with small voids.  He has had prior stones but no surgery.  He has no other GU history.  He has renal insufficiency and a recent creatinine of 4.4.  He reports a CT scan done by Dr. Collene Mares in October as well.   Past Medical History Problems  1. History of  Acute Myocardial Infarction V12.59 2. History of  Acute Renal Failure 584.9 3. 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 History of  Diabetes Mellitus 250.00 5. History of  Glaucoma 365.9 6. History of  Heart Disease 429.9 7. History of  Hypercholesterolemia 272.0 8. History of  Hypertension 401.9  Surgical History Problems  1. History of  Back Surgery 2. History of  Back Surgery 3. 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 History of  Cath Stent Placement 5. History of  Cholecystectomy 6. History of  Heart Surgery  Current Meds 1. AmLODIPine Besylate 5 MG Oral Tablet; Therapy: (Recorded:22Feb2013) to 2. Colon Care CAPS; Therapy: (Recorded:22Feb2013) to 3. Crestor 40 MG Oral Tablet; Therapy: (Recorded:22Feb2013) to 4. GlipiZIDE 10 MG Oral Tablet; Therapy: (Recorded:22Feb2013) to 5. Janumet 50-1000 MG Oral Tablet; Therapy: (Recorded:22Feb2013) to 6. LORazepam 0.5 MG Oral Tablet; Therapy: (Recorded:22Feb2013) to 7. Lyrica 100 MG Oral Capsule; Therapy: (Recorded:22Feb2013) to 8. MetroNIDAZOLE 500 MG Oral Tablet; Therapy: 22Feb2013  to 9. Micardis HCT 80-25 MG Oral Tablet; Therapy: (Recorded:22Feb2013) to 10. Vitamin B-12 TABS; Therapy: (Recorded:22Feb2013) to 11. Zolpidem Tartrate 10 MG Oral Tablet; Therapy: (Recorded:22Feb2013) to  Allergies Medication  1. No Known Drug Allergies  Family History Problems  1. Family history of  Death In The Family Father 2. Family history of  Death In The Family Mother 3. Paternal history of  Diabetes Mellitus V18.0 4. Family history of  Family Health Status Number Of Children 5. Maternal history of  Gout V18.19 6. Paternal history of  Heart Disease V17.49  Social History Problems    Caffeine Use   Marital History - Currently Married   Never A Smoker   Retired From Work Denied    History of  Alcohol Use  Review of Systems Genitourinary, constitutional, skin, eye, otolaryngeal, hematologic/lymphatic, cardiovascular, pulmonary, endocrine, musculoskeletal, gastrointestinal, neurological and psychiatric system(s) were reviewed and pertinent findings if present are noted.  Genitourinary: nocturia, difficulty starting the urinary stream, weak urinary stream, urinary stream starts and stops and erectile dysfunction.  Gastrointestinal: abdominal pain.  Constitutional: feeling tired (fatigue) and recent 11 lb weight loss.  Integumentary: pruritus.  Respiratory: shortness of breath.  Musculoskeletal: back pain.  Neurological: dizziness.    Vitals Vital Signs [Data Includes: Last 1 Day]  22Feb2013 10:40AM  BMI Calculated: 29.66 BSA Calculated: 1.97 Height: 5 ft 7 in Weight: 189 lb  Blood Pressure: 149 / 76 Temperature: 97 F Heart Rate: 73  Physical Exam Constitutional: Well nourished and well developed . No acute distress.  ENT:. The ears and nose are normal in appearance.  Neck: The appearance of the neck is normal and no neck mass is present.  Pulmonary: No respiratory distress and normal respiratory rhythm and effort.  Cardiovascular: Heart rate and rhythm are  normal . No peripheral edema.  Abdomen: The abdomen is soft and nontender. No masses are palpated. Mild tenderness in the LLQ is present. No CVA tenderness. No hernias are palpable. No hepatosplenomegaly noted.  Lymphatics: The femoral and inguinal nodes are not enlarged or tender.  Skin: Normal skin turgor, no visible rash and no visible skin lesions.  Neuro/Psych:. Mood and affect are appropriate.    Results/Data Urine [Data Includes: Last 1 Day]   22Feb2013  COLOR YELLOW   APPEARANCE CLEAR   SPECIFIC GRAVITY 1.020   pH 5.0   GLUCOSE NEG mg/dL  BILIRUBIN NEG   KETONE NEG mg/dL  BLOOD TRACE   PROTEIN NEG mg/dL  UROBILINOGEN 0.2 mg/dL  NITRITE NEG   LEUKOCYTE ESTERASE NEG   SQUAMOUS EPITHELIAL/HPF NONE SEEN   WBC 0-3 WBC/hpf  RBC 0-3 RBC/hpf  BACTERIA NONE SEEN   CRYSTALS NONE SEEN   CASTS NONE SEEN    Old records or history reviewed: I have reviewed records and labs from Dr. Brigitte Pulse.  The following images/tracing/specimen were independently visualized:  I have reviewed his CT from 10/12 and his left renal stone has 462HU. There was a 22mm stone in the LLP. I have reviewed his [place] from today and he has left hydro with the stone in the mid ureter. KUB today does not show the stone which probably uric acid based on the CT density measurements. He has calcified SV's. No other significant findings are noted.  The following clinical lab reports were reviewed:  Outside chemistries reviewed.    Assessment Assessed  1. Ureteral Stone Left 592.1 2. Hydronephrosis On The Left 591 3. Acute Renal Insufficiency 593.9   He has a left ureteral stone that is probably uric acid with hydro and renal insufficiency.   Plan Health Maintenance (V70.0)  1. UA With REFLEX  Done: [id number] 10:45AM Ureteral Stone (592.1)  2. KUB  Done: 22Feb2013 12:00AM 3. Follow-up Schedule Surgery Office  Follow-up  Done: 22Feb2013   I am going to set him up for cystoscopy with a left retrograde pyelogram, left  ureteroscopy and probable stent.  I have reviewed the risks of bleeding, infection, ureteral injury, need for a stent or secondary procedures, thrombotic events and anesthetic complications. We will get that done today.

## 2012-02-16 NOTE — Progress Notes (Signed)
Pt ambulated to bathroom and tolerated well.

## 2012-02-16 NOTE — Anesthesia Postprocedure Evaluation (Signed)
  Anesthesia Post-op Note  Patient: Allen Medina  Procedure(s) Performed: Procedure(s) (LRB): CYSTOSCOPY/RETROGRADE/URETEROSCOPY/STONE EXTRACTION WITH BASKET (Left) HOLMIUM LASER APPLICATION (N/A)  Patient Location: PACU  Anesthesia Type: General  Level of Consciousness: oriented and sedated  Airway and Oxygen Therapy: Patient Spontanous Breathing and Patient connected to nasal cannula oxygen  Post-op Pain: mild  Post-op Assessment: Post-op Vital signs reviewed, Patient's Cardiovascular Status Stable, Respiratory Function Stable and Patent Airway  Post-op Vital Signs: stable  Complications: No apparent anesthesia complications

## 2012-02-16 NOTE — Discharge Instructions (Signed)
Ureteral Stent A ureteral stent is a soft plastic tube with multiple holes. The stent is inserted into a ureter to help drain urine from the kidney into the bladder. The tube has a coil on each end to keep it from falling out. One end stays in the kidney. The other end stays in the bladder. A stent cannot be seen from the outside. Usually it does not keep you from going about normal routines. A ureteral stent is used to bypass a blockage in your kidney or ureter. This blockage can be caused by kidney stones, scar tissue, pregnancy, or other causes. It can also be used during treatment to remove a kidney stone or to let a ureter heal after surgery. The stent allows urine to drain from the kidney into the bladder. It is most often taken out after the blockage has been removed or the ureter has healed. If a stent is needed for a long time, it will be changed every few months. INSERTING THE STENT Your stent is put in by a urologist. This is a medical doctor trained for treating genitourinary (kidney, ureter and bladder) problems. Before your stent is put in, your caregiver may order x-rays or other imaging tests of your kidneys and ureters. The stent is inserted in a hospital or same day surgical center. You can anticipate going home the same day. PROCEDURE  A special x-ray machine called a fluoroscope is used to guide the insertion of your stent. This allows your doctor to make sure the stent is in the correct place.   First you are given anesthesia to keep you comfortable.   Then your doctor inserts a special lighted instrument called a cystoscope into your bladder. This allows your doctor to see the opening to the ureter.   A thin wire is carefully threaded into the bladder and up the ureter. The stent is inserted over the wire and the wire is then removed.  HOME CARE INSTRUCTIONS   While the stent is in place, you may feel some discomfort. Certain movements may trigger pain or a feeling that you need  to urinate. Your caregiver may give you pain medication. Only take over-the-counter or prescription medicines for pain, discomfort, or fever as directed by your caregiver. Do not take aspirin as this can make bleeding worse.   You may be given medications to prevent infection or bladder spasms. Be sure to take all medications as directed.   Drink plenty of fluids.   You may have small amounts of bleeding causing your urine to be slightly red. This is nothing to be concerned about.  REMOVAL OF THE STENT Your stent is left in until the blockage is resolved. This may take two weeks or longer. Before the stent is removed, you may have an x-ray make sure the ureter is open. The stent can be removed by your caregiver in the office. Medications may be given for comfort. Be sure to keep all follow-up appointments so your caregiver can check that you are healing properly. SEEK IMMEDIATE MEDICAL CARE IF:   Your urine is dark red or has blood clots.   You are incontinent (leaking urine).   You have an oral temperature above 102 F (38.9 C), chills, nausea (feeling sick to your stomach), or vomiting.   Your pain is not relieved by pain medication. Do not take aspirin as this can make bleeding worse.   The end of the stent comes out of the urethra.  Document Released: 12/02/2000 Document Revised:  08/17/2011 Document Reviewed: 12/01/2008 [facility] Patient Information 2012 [place].   Bring stone to f/u visit.  Don't pull string until Monday morning.   You will see stone fragments pass as tiny fragments were left behind.

## 2012-02-16 NOTE — Anesthesia Preprocedure Evaluation (Signed)
Anesthesia Evaluation  Patient identified by MRN, date of birth, ID band Patient awake    Reviewed: Allergy & Precautions, H&P , NPO status , Patient's Chart, lab work & pertinent test results, reviewed documented beta blocker date and time   Airway Mallampati: II TM Distance: >3 FB Neck ROM: Full    Dental  (+) Teeth Intact and Dental Advisory Given   Pulmonary neg pulmonary ROS,  clear to auscultation        Cardiovascular hypertension, + CAD Regular Normal CAD, s/p CABG X2 Currently asymptomatic   Neuro/Psych Negative Neurological ROS  Negative Psych ROS   GI/Hepatic negative GI ROS, Neg liver ROS,   Endo/Other  Diabetes mellitus-, Well Controlled, Type 2, Oral Hypoglycemic Agents  Renal/GU Reduced renal function, Cr 2.34 Kidney stone  Genitourinary negative   Musculoskeletal negative musculoskeletal ROS (+)   Abdominal   Peds negative pediatric ROS (+)  Hematology negative hematology ROS (+)   Anesthesia Other Findings   Reproductive/Obstetrics negative OB ROS                           Anesthesia Physical Anesthesia Plan  ASA: III  Anesthesia Plan: General   Post-op Pain Management:    Induction: Intravenous  Airway Management Planned: LMA  Additional Equipment:   Intra-op Plan:   Post-operative Plan: Extubation in OR  Informed Consent: I have reviewed the patients History and Physical, chart, labs and discussed the procedure including the risks, benefits and alternatives for the proposed anesthesia with the patient or authorized representative who has indicated his/her understanding and acceptance.   Dental advisory given  Plan Discussed with: CRNA and Surgeon  Anesthesia Plan Comments:         Anesthesia Quick Evaluation

## 2012-02-17 ENCOUNTER — Encounter (HOSPITAL_COMMUNITY): Payer: Self-pay | Admitting: Urology

## 2012-02-17 NOTE — Op Note (Signed)
NAMEJHAN, Allen Medina              ACCOUNT NO.:  [id number]  MEDICAL RECORD NO.:  [id number]  LOCATION:  [facility]                         FACILITY:  [facility]  PHYSICIAN:  Marshall Cork. Jeffie Pollock, M.D.    DATE OF BIRTH:  1941/07/27  DATE OF PROCEDURE:  02/16/2012 DATE OF DISCHARGE:  02/16/2012                              OPERATIVE REPORT   The patient of Dr. Marshall Cork. Rahcel Shutes.  PROCEDURES:  Cystoscopy, removal of left double-J stent, left ureteroscopic stone extraction with holmium laser lithotripsy and insertion of left double-J stent.  PREOPERATIVE DIAGNOSIS:  Left proximal stone.  POSTOPERATIVE DIAGNOSIS:  Left proximal stone.  SURGEON:  Marshall Cork. Jeffie Pollock, M.D.  ANESTHESIA:  General.  SPECIMEN:  Stone fragments.  DRAINS:  Six-French 26 cm double-J stent.  BLOOD LOSS:  Minimal.  COMPLICATIONS:  None.  INDICATIONS:  Allen Medina is a 71 year old white male with a radiolucent right proximal stone, measures 9 mm.  He underwent stenting last week and returns today for definitive therapy.  FINDINGS OF PROCEDURE:  He was taken to operating room where general anesthetic was induced.  He was given Cipro.  He was placed in lithotomy position and fitted with PAS hose.  His genitalia and perineum were prepped with Betadine solution and draped in usual sterile fashion. Cystoscopy was performed using a 22-French scope and 12-degree lens. The stent was visualized at the left ureteral orifice and grasped, pulled to urethral meatus, and the wire was passed to the kidney.  A 38 cm digital access sheath was passed over the wire to the kidney. The wire and inner core removed, and the 6.4-French digital ureteroscope was passed to the kidney.  The stone was visualized.  It was engaged with a 200 micron laser fiber initially at 0.5 watts and 10 Hz, but later at 0.5 watts and 20 Hz, and broken into small fragments.  The largest fragments were removed, measuring 2-3 mm in size, but it __________ and small fragments  remaining in the caliceal system, but no large fragments remained.  At this point, a guidewire was passed through the access sheath to the kidney.  The access sheath was removed.  The cystoscope was reinserted over the wire, and a 6-French 26 cm double-J stent with string was inserted without difficulty under fluoroscopic guidance.  The wire was removed leaving a good coil in the kidney, a good coil in the bladder. The bladder was drained.  The stent string was left exiting the urethra. The patient was taken down from lithotomy position.  His anesthetic was reversed.  He was moved to recovery room in stable condition. __________suppository was placed prior to __________.  His stone was given to his family to bring to the office for analysis.  There were no complications.     Marshall Cork. Jeffie Pollock, M.D.     JJW/MEDQ  D:  02/16/2012  T:  02/17/2012  Job:  [id number]  cc:   Janalyn Rouse, M.D. Fax: [phone]

## 2012-03-12 ENCOUNTER — Encounter (INDEPENDENT_AMBULATORY_CARE_PROVIDER_SITE_OTHER): Payer: Medicare Other | Admitting: Ophthalmology

## 2012-03-12 DIAGNOSIS — H43819 Vitreous degeneration, unspecified eye: Secondary | ICD-10-CM

## 2012-03-12 DIAGNOSIS — E1139 Type 2 diabetes mellitus with other diabetic ophthalmic complication: Secondary | ICD-10-CM

## 2012-03-12 DIAGNOSIS — E1165 Type 2 diabetes mellitus with hyperglycemia: Secondary | ICD-10-CM

## 2012-03-12 DIAGNOSIS — E11359 Type 2 diabetes mellitus with proliferative diabetic retinopathy without macular edema: Secondary | ICD-10-CM

## 2012-05-17 ENCOUNTER — Other Ambulatory Visit: Payer: Self-pay | Admitting: Nephrology

## 2012-05-17 DIAGNOSIS — N183 Chronic kidney disease, stage 3 unspecified: Secondary | ICD-10-CM

## 2012-05-21 ENCOUNTER — Ambulatory Visit
Admission: RE | Admit: 2012-05-21 | Discharge: 2012-05-21 | Disposition: A | Discharge status: Home / Self Care | Payer: Medicare Other | Source: Ambulatory Visit | Attending: Nephrology | Admitting: Nephrology

## 2012-05-21 DIAGNOSIS — N183 Chronic kidney disease, stage 3 unspecified: Secondary | ICD-10-CM

## 2013-01-11 ENCOUNTER — Encounter: Payer: Self-pay | Admitting: *Deleted

## 2013-02-16 HISTORY — PX: NM MYOVIEW LTD: HXRAD82

## 2013-03-12 ENCOUNTER — Ambulatory Visit (INDEPENDENT_AMBULATORY_CARE_PROVIDER_SITE_OTHER): Payer: Medicare Other | Admitting: Ophthalmology

## 2013-03-12 DIAGNOSIS — E1139 Type 2 diabetes mellitus with other diabetic ophthalmic complication: Secondary | ICD-10-CM

## 2013-03-12 DIAGNOSIS — E11359 Type 2 diabetes mellitus with proliferative diabetic retinopathy without macular edema: Secondary | ICD-10-CM

## 2013-03-17 ENCOUNTER — Inpatient Hospital Stay (HOSPITAL_COMMUNITY)
Admission: EM | Admit: 2013-03-17 | Discharge: 2013-03-18 | DRG: 313 | Disposition: A | Discharge status: Home / Self Care | Payer: Medicare Other | Attending: Cardiology | Admitting: Cardiology

## 2013-03-17 ENCOUNTER — Encounter (HOSPITAL_COMMUNITY): Payer: Self-pay | Admitting: *Deleted

## 2013-03-17 ENCOUNTER — Emergency Department (HOSPITAL_COMMUNITY): Payer: Medicare Other

## 2013-03-17 DIAGNOSIS — I10 Essential (primary) hypertension: Secondary | ICD-10-CM | POA: Diagnosis present

## 2013-03-17 DIAGNOSIS — R079 Chest pain, unspecified: Secondary | ICD-10-CM

## 2013-03-17 DIAGNOSIS — M4800 Spinal stenosis, site unspecified: Secondary | ICD-10-CM | POA: Insufficient documentation

## 2013-03-17 DIAGNOSIS — R0789 Other chest pain: Principal | ICD-10-CM | POA: Diagnosis present

## 2013-03-17 DIAGNOSIS — E1149 Type 2 diabetes mellitus with other diabetic neurological complication: Secondary | ICD-10-CM | POA: Diagnosis present

## 2013-03-17 DIAGNOSIS — I739 Peripheral vascular disease, unspecified: Secondary | ICD-10-CM | POA: Diagnosis present

## 2013-03-17 DIAGNOSIS — I498 Other specified cardiac arrhythmias: Secondary | ICD-10-CM | POA: Diagnosis present

## 2013-03-17 DIAGNOSIS — I129 Hypertensive chronic kidney disease with stage 1 through stage 4 chronic kidney disease, or unspecified chronic kidney disease: Secondary | ICD-10-CM | POA: Diagnosis present

## 2013-03-17 DIAGNOSIS — G4700 Insomnia, unspecified: Secondary | ICD-10-CM | POA: Diagnosis present

## 2013-03-17 DIAGNOSIS — I25810 Atherosclerosis of coronary artery bypass graft(s) without angina pectoris: Secondary | ICD-10-CM | POA: Diagnosis present

## 2013-03-17 DIAGNOSIS — E1142 Type 2 diabetes mellitus with diabetic polyneuropathy: Secondary | ICD-10-CM | POA: Diagnosis present

## 2013-03-17 DIAGNOSIS — E538 Deficiency of other specified B group vitamins: Secondary | ICD-10-CM | POA: Diagnosis present

## 2013-03-17 DIAGNOSIS — E1129 Type 2 diabetes mellitus with other diabetic kidney complication: Secondary | ICD-10-CM | POA: Diagnosis present

## 2013-03-17 DIAGNOSIS — E785 Hyperlipidemia, unspecified: Secondary | ICD-10-CM | POA: Diagnosis present

## 2013-03-17 DIAGNOSIS — E119 Type 2 diabetes mellitus without complications: Secondary | ICD-10-CM

## 2013-03-17 DIAGNOSIS — I252 Old myocardial infarction: Secondary | ICD-10-CM

## 2013-03-17 DIAGNOSIS — Z9861 Coronary angioplasty status: Secondary | ICD-10-CM

## 2013-03-17 DIAGNOSIS — I200 Unstable angina: Secondary | ICD-10-CM

## 2013-03-17 DIAGNOSIS — E1169 Type 2 diabetes mellitus with other specified complication: Secondary | ICD-10-CM | POA: Diagnosis present

## 2013-03-17 DIAGNOSIS — I2510 Atherosclerotic heart disease of native coronary artery without angina pectoris: Secondary | ICD-10-CM | POA: Diagnosis present

## 2013-03-17 DIAGNOSIS — Z951 Presence of aortocoronary bypass graft: Secondary | ICD-10-CM

## 2013-03-17 DIAGNOSIS — N184 Chronic kidney disease, stage 4 (severe): Secondary | ICD-10-CM | POA: Diagnosis present

## 2013-03-17 HISTORY — DX: Deficiency of other specified B group vitamins: E53.8

## 2013-03-17 HISTORY — DX: Type 2 diabetes mellitus with diabetic neuropathy, unspecified: E11.40

## 2013-03-17 HISTORY — DX: Chronic kidney disease, stage 4 (severe): N18.4

## 2013-03-17 HISTORY — DX: Essential (primary) hypertension: I10

## 2013-03-17 HISTORY — DX: Spinal stenosis, site unspecified: M48.00

## 2013-03-17 HISTORY — DX: Type 2 diabetes mellitus with unspecified complications: E11.8

## 2013-03-17 HISTORY — DX: Hyperlipidemia, unspecified: E78.5

## 2013-03-17 LAB — GLUCOSE, CAPILLARY: Glucose-Capillary: 72 mg/dL (ref 70–99)

## 2013-03-17 LAB — COMPREHENSIVE METABOLIC PANEL
BUN: 30 mg/dL — ABNORMAL HIGH (ref 6–23)
CO2: 26 mEq/L (ref 19–32)
Chloride: 105 mEq/L (ref 96–112)
Creatinine, Ser: 2.13 mg/dL — ABNORMAL HIGH (ref 0.50–1.35)
GFR calc non Af Amer: 30 mL/min — ABNORMAL LOW (ref >90)
Total Bilirubin: 0.4 mg/dL (ref 0.3–1.2)

## 2013-03-17 LAB — BASIC METABOLIC PANEL
Calcium: 9.6 mg/dL (ref 8.4–10.5)
GFR calc non Af Amer: 28 mL/min — ABNORMAL LOW (ref >90)
Glucose, Bld: 145 mg/dL — ABNORMAL HIGH (ref 70–99)
Potassium: 4 mEq/L (ref 3.5–5.1)
Sodium: 138 mEq/L (ref 135–145)

## 2013-03-17 LAB — CBC
HCT: 34 % — ABNORMAL LOW (ref 39.0–52.0)
Hemoglobin: 12.1 g/dL — ABNORMAL LOW (ref 13.0–17.0)
MCHC: 34 g/dL (ref 30.0–36.0)
MCV: 90.7 fL (ref 78.0–100.0)
Platelets: 224 10*3/uL (ref 150–400)
RBC: 3.95 MIL/uL — ABNORMAL LOW (ref 4.22–5.81)
RDW: 12.8 % (ref 11.5–15.5)
WBC: 8.6 10*3/uL (ref 4.0–10.5)

## 2013-03-17 LAB — PRO B NATRIURETIC PEPTIDE: Pro B Natriuretic peptide (BNP): 83.3 pg/mL (ref 0–125)

## 2013-03-17 LAB — LIPID PANEL
HDL: 51 mg/dL (ref >39)
LDL Cholesterol: 30 mg/dL (ref 0–99)
Total CHOL/HDL Ratio: 1.9 RATIO

## 2013-03-17 LAB — TROPONIN I
Troponin I: <0.3 ng/mL (ref <0.30)
Troponin I: <0.3 ng/mL (ref <0.30)

## 2013-03-17 MED ORDER — INSULIN GLARGINE 100 UNIT/ML ~~LOC~~ SOLN
16.0000 [IU] | Freq: Every day | SUBCUTANEOUS | Status: DC
Start: 2013-03-17 — End: 2013-03-18
  Administered 2013-03-17 – 2013-03-18 (×2): 16 [IU] via SUBCUTANEOUS
  Filled 2013-03-17 (×2): qty 0.16

## 2013-03-17 MED ORDER — SODIUM CHLORIDE 0.9 % IJ SOLN
3.0000 mL | Freq: Two times a day (BID) | INTRAMUSCULAR | Status: DC
  Administered 2013-03-17 – 2013-03-18 (×2): 3 mL via INTRAVENOUS

## 2013-03-17 MED ORDER — ACETAMINOPHEN 650 MG RE SUPP: 650.0000 mg | Freq: Four times a day (QID) | RECTAL | Status: DC | PRN

## 2013-03-17 MED ORDER — PREGABALIN 50 MG PO CAPS
75.0000 mg | ORAL_CAPSULE | Freq: Three times a day (TID) | ORAL | Status: DC
  Administered 2013-03-17 – 2013-03-18 (×5): 75 mg via ORAL
  Filled 2013-03-17 (×5): qty 1

## 2013-03-17 MED ORDER — ATORVASTATIN CALCIUM 80 MG PO TABS
80.0000 mg | ORAL_TABLET | Freq: Every day | ORAL | Status: DC
  Administered 2013-03-17: 80 mg via ORAL
  Filled 2013-03-17 (×2): qty 1

## 2013-03-17 MED ORDER — ASPIRIN EC 325 MG PO TBEC
325.0000 mg | DELAYED_RELEASE_TABLET | Freq: Every day | ORAL | Status: DC
  Administered 2013-03-17 – 2013-03-18 (×2): 325 mg via ORAL
  Filled 2013-03-17 (×2): qty 1

## 2013-03-17 MED ORDER — LINAGLIPTIN 5 MG PO TABS
5.0000 mg | ORAL_TABLET | Freq: Every day | ORAL | Status: DC
  Administered 2013-03-17 – 2013-03-18 (×2): 5 mg via ORAL
  Filled 2013-03-17 (×2): qty 1

## 2013-03-17 MED ORDER — DOCUSATE SODIUM 100 MG PO CAPS
100.0000 mg | ORAL_CAPSULE | Freq: Two times a day (BID) | ORAL | Status: DC
  Administered 2013-03-17 – 2013-03-18 (×3): 100 mg via ORAL
  Filled 2013-03-17 (×4): qty 1

## 2013-03-17 MED ORDER — AMLODIPINE BESYLATE 10 MG PO TABS
10.0000 mg | ORAL_TABLET | Freq: Every day | ORAL | Status: DC
  Administered 2013-03-17 – 2013-03-18 (×2): 10 mg via ORAL
  Filled 2013-03-17 (×2): qty 1

## 2013-03-17 MED ORDER — INSULIN ASPART 100 UNIT/ML ~~LOC~~ SOLN
0.0000 [IU] | Freq: Three times a day (TID) | SUBCUTANEOUS | Status: DC
  Administered 2013-03-18: 3 [IU] via SUBCUTANEOUS
  Administered 2013-03-18: 5 [IU] via SUBCUTANEOUS

## 2013-03-17 MED ORDER — ENOXAPARIN SODIUM 100 MG/ML ~~LOC~~ SOLN
85.0000 mg | Freq: Once | SUBCUTANEOUS | Status: AC
  Administered 2013-03-17: 85 mg via SUBCUTANEOUS
  Filled 2013-03-17: qty 1

## 2013-03-17 MED ORDER — OXYCODONE HCL 5 MG PO TABS: 5.0000 mg | ORAL_TABLET | ORAL | Status: DC | PRN

## 2013-03-17 MED ORDER — VITAMIN D (ERGOCALCIFEROL) 1.25 MG (50000 UNIT) PO CAPS
50000.0000 [IU] | ORAL_CAPSULE | ORAL | Status: DC
  Filled 2013-03-17: qty 1

## 2013-03-17 MED ORDER — NITROGLYCERIN 0.4 MG SL SUBL: 0.4000 mg | SUBLINGUAL_TABLET | SUBLINGUAL | Status: DC | PRN

## 2013-03-17 MED ORDER — ENOXAPARIN SODIUM 100 MG/ML ~~LOC~~ SOLN
1.0000 mg/kg | Freq: Two times a day (BID) | SUBCUTANEOUS | Status: DC
  Administered 2013-03-17: 95 mg via SUBCUTANEOUS
  Administered 2013-03-18: 06:00:00 via SUBCUTANEOUS
  Administered 2013-03-18: 95 mg via SUBCUTANEOUS
  Filled 2013-03-17 (×4): qty 1

## 2013-03-17 MED ORDER — POTASSIUM CHLORIDE IN NACL 20-0.9 MEQ/L-% IV SOLN
INTRAVENOUS | Status: DC
  Administered 2013-03-17 – 2013-03-18 (×3): via INTRAVENOUS
  Filled 2013-03-17 (×4): qty 1000

## 2013-03-17 MED ORDER — ONDANSETRON HCL 4 MG/2ML IJ SOLN: 4.0000 mg | Freq: Four times a day (QID) | INTRAMUSCULAR | Status: DC | PRN

## 2013-03-17 MED ORDER — GLIPIZIDE 10 MG PO TABS
10.0000 mg | ORAL_TABLET | Freq: Two times a day (BID) | ORAL | Status: DC
  Administered 2013-03-17: 10 mg via ORAL
  Filled 2013-03-17 (×3): qty 1

## 2013-03-17 MED ORDER — MORPHINE SULFATE 2 MG/ML IJ SOLN
1.0000 mg | INTRAMUSCULAR | Status: DC | PRN
  Administered 2013-03-17: 1 mg via INTRAVENOUS
  Filled 2013-03-17: qty 1

## 2013-03-17 MED ORDER — ONDANSETRON HCL 4 MG PO TABS: 4.0000 mg | ORAL_TABLET | Freq: Four times a day (QID) | ORAL | Status: DC | PRN

## 2013-03-17 MED ORDER — ZOLPIDEM TARTRATE 5 MG PO TABS
5.0000 mg | ORAL_TABLET | Freq: Every evening | ORAL | Status: DC | PRN
  Administered 2013-03-17: 5 mg via ORAL
  Filled 2013-03-17: qty 1

## 2013-03-17 MED ORDER — NITROGLYCERIN 2 % TD OINT
1.0000 [in_us] | TOPICAL_OINTMENT | Freq: Once | TRANSDERMAL | Status: AC
Start: 2013-03-17 — End: 2013-03-17
  Administered 2013-03-17: 1 [in_us] via TOPICAL
  Filled 2013-03-17: qty 1

## 2013-03-17 MED ORDER — CLOPIDOGREL BISULFATE 300 MG PO TABS
300.0000 mg | ORAL_TABLET | Freq: Once | ORAL | Status: AC
  Administered 2013-03-17: 300 mg via ORAL
  Filled 2013-03-17: qty 1

## 2013-03-17 MED ORDER — ASPIRIN 81 MG PO CHEW
162.0000 mg | CHEWABLE_TABLET | Freq: Once | ORAL | Status: AC
  Administered 2013-03-17: 162 mg via ORAL
  Filled 2013-03-17: qty 2

## 2013-03-17 MED ORDER — IRBESARTAN 300 MG PO TABS
300.0000 mg | ORAL_TABLET | Freq: Every day | ORAL | Status: DC
  Administered 2013-03-17 – 2013-03-18 (×2): 300 mg via ORAL
  Filled 2013-03-17 (×2): qty 1

## 2013-03-17 MED ORDER — CYANOCOBALAMIN 1000 MCG/ML IJ SOLN
1000.0000 ug | INTRAMUSCULAR | Status: DC
  Administered 2013-03-17: 1000 ug via INTRAMUSCULAR
  Filled 2013-03-17: qty 1

## 2013-03-17 MED ORDER — ACETAMINOPHEN 325 MG PO TABS: 650.0000 mg | ORAL_TABLET | Freq: Four times a day (QID) | ORAL | Status: DC | PRN

## 2013-03-17 NOTE — Consult Note (Addendum)
Reason for Consult: chest pain with known CAD, with CABG and re-do CABG    Referring Physician: Dr. Osborne Casco   Cardiologist: Dr. Ellyn Hack PCP: Dr. Lovey Newcomer is an 72 y.o. male.    Chief Complaint: Admitted during the night with chest pain.   HPI: Patient is a 72 year old male with a considerable history of CAD s/p CABG in 1991 and redo in 2000,  With last cath 2006 with patent grafts, last nuc 09/10/09 normal who is followed at [facility].  He came to the [facility] ER with chest pain beginning before 11pm last night which was localized in his left/mid chest. He associates this with weakness and some shortness of breath. He has also had pain of this nature for the past 3 nights but elected to come to the ER this evening. He was given NTG and was pain free in the ER. Troponin remained negative.     Troponin negative this AM.   ECG without ischemic changes.   Past Medical History  Diagnosis Date  . S/P CABG x 5 1991  . S/P CABG x 4 2000  . Hypertension   . Blood transfusion 2010  . Diabetes mellitus   . Coronary artery disease     last cath 2006-patent grafts, last nuc 09/10/09-no ischemia  . MI, old   . Peripheral vascular disease     peripheral neuropathy  . Myocardial infarction 2000    LOV with EKG Dr Rex Kras 7/12 on chart, eccho 10/03, stress test 9/10 on chart, chest x ray 2/13 EPIC, chest CT 02/13/12 on chart  . CKD (chronic kidney disease) stage 4, GFR 15-29 ml/min     kidney stones,cmet 02/09/12, 02/13/12 on chart  . Hx of echocardiogram 10/14/2002    EF 50-55%, no valve issues  . Claudication     LE dopplers 03/17/10, normal ABIs, normal pressures  . HTN (hypertension)     difficult to control  . Diabetes mellitus type 2 with complications     renal insuff, HTN, CAD, PVD  . Hyperlipidemia LDL goal < 100   . Spinal stenosis   . Vitamin B 12 deficiency   . Neuropathy in diabetes     Past Surgical History  Procedure Laterality Date  .  Cholecystectomy    . Back surgery  2009, 2010  . Coronary artery bypass graft  1991  . 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 Cystoscopy/retrograde/ureteroscopy/stone extraction with basket  02/16/2012    Procedure: CYSTOSCOPY/RETROGRADE/URETEROSCOPY/STONE EXTRACTION WITH BASKET;  Surgeon: Malka So, MD;  Location: [facility];  Service: Urology;  Laterality: Left;  Left Ureterscopy with Stone Extraction  . Cardiac catheterization  06/04/1990    severe 3 vessel dz  . Cardiac catheterization  01/13/97    patent grafts, nl LV function  . Cardiac catheterization  01/28/1999    severe native CAD with disease involving all 3 SVG, recommend redo CABG  . Cardiac catheterization  05/16/2002    patent graft except of mod to sever stenosis in the midprotion of the RIMA to the posterior descending artery  . Coronary angioplasty with stent placement  05/22/2002    3.0x52mm cypher stent covered the proximal and distal portion of RIMA, final inflation -16x60  . Cardiac catheterization  08/28/2003    nl lv fxn, patent grafts to all vessels with small vessel disease  . Cardiac catheterization  07/07/2005    patent grafts, EF 45-50%  .  Coronary artery bypass graft  01/25/1999    redo CABG, sequential reverse SVG to the first dia and disstal circ, reverse SVG to third diaf, RIMA to posterior descending cor artery, previous LIMA to LAD patent     History reviewed. No pertinent family history. Social History:  reports that he has never smoked. He does not have any smokeless tobacco history on file. He reports that he does not drink alcohol or use illicit drugs.  Family History: Reviewed with patient -- not significant to this admission based upon his extensive CAD history.  Allergies:  Allergies  Allergen Reactions  . Food     Pt states he is not allergic to gluten   . No Known Drug Allergy     Medications Prior to Admission  Medication Sig Dispense Refill  . amLODipine (NORVASC) 10 MG tablet Take  10 mg by mouth daily.      . cyanocobalamin (,VITAMIN B-12,) 1000 MCG/ML injection Inject 1,000 mcg into the muscle every 30 (thirty) days. Next due 02/18/12      . glipiZIDE (GLUCOTROL) 10 MG tablet Take 10 mg by mouth 2 (two) times daily before a meal. STATES IS Taking 5 mg every morning since 02/06/12      . insulin glargine (LANTUS) 100 UNIT/ML injection Inject 16 Units into the skin daily.      Marland Kitchen LORazepam (ATIVAN) 0.5 MG tablet Take 0.5 mg by mouth daily.      . pregabalin (LYRICA) 75 MG capsule Take 75 mg by mouth 3 (three) times daily.      . rosuvastatin (CRESTOR) 20 MG tablet Take 20 mg by mouth daily.      . sitaGLIPtin (JANUVIA) 50 MG tablet Take 50 mg by mouth daily.      . tadalafil (CIALIS) 5 MG tablet Take 5 mg by mouth daily as needed for erectile dysfunction.      Marland Kitchen telmisartan-hydrochlorothiazide (MICARDIS HCT) 80-25 MG per tablet Take 1 tablet by mouth daily.      . Vitamin D, Ergocalciferol, (DRISDOL) 50000 UNITS CAPS Take 50,000 Units by mouth every 7 (seven) days. Takes on Friday      . zolpidem (AMBIEN CR) 12.5 MG CR tablet Take 12.5 mg by mouth at bedtime.        Results for orders placed during the hospital encounter of 03/17/13 (from the past 48 hour(s))  CBC     Status: Abnormal   Collection Time    03/17/13 12:23 AM      Result Value Range   WBC 9.8  4.0 - 10.5 K/uL   RBC 3.95 (*) 4.22 - 5.81 MIL/uL   Hemoglobin 12.1 (*) 13.0 - 17.0 g/dL   HCT 35.6 (*) 39.0 - 52.0 %   MCV 90.1  78.0 - 100.0 fL   MCH 30.6  26.0 - 34.0 pg   MCHC 34.0  30.0 - 36.0 g/dL   RDW 12.7  11.5 - 15.5 %   Platelets 224  150 - 400 K/uL  BASIC METABOLIC PANEL     Status: Abnormal   Collection Time    03/17/13 12:23 AM      Result Value Range   Sodium 138  135 - 145 mEq/L   Potassium 4.0  3.5 - 5.1 mEq/L   Chloride 102  96 - 112 mEq/L   CO2 25  19 - 32 mEq/L   Glucose, Bld 145 (*) 70 - 99 mg/dL   BUN 31 (*) 6 - 23 mg/dL   Creatinine,  Ser 2.22 (*) 0.50 - 1.35 mg/dL   Calcium 9.6  8.4  - 10.5 mg/dL   GFR calc non Af Amer 28 (*) >90 mL/min   GFR calc Af Amer 33 (*) >90 mL/min   Comment:            The eGFR has been calculated     using the CKD EPI equation.     This calculation has not been     validated in all clinical     situations.     eGFR's persistently     <90 mL/min signify     possible Chronic Kidney Disease.  PRO B NATRIURETIC PEPTIDE     Status: None   Collection Time    03/17/13 12:23 AM      Result Value Range   Pro B Natriuretic peptide (BNP) 83.3  0 - 125 pg/mL  POCT I-STAT TROPONIN I     Status: None   Collection Time    03/17/13 12:48 AM      Result Value Range   Troponin i, poc 0.00  0.00 - 0.08 ng/mL   Comment 3            Comment: Due to the release kinetics of cTnI,     a negative result within the first hours     of the onset of symptoms does not rule out     myocardial infarction with certainty.     If myocardial infarction is still suspected,     repeat the test at appropriate intervals.  GLUCOSE, CAPILLARY     Status: None   Collection Time    03/17/13  6:16 AM      Result Value Range   Glucose-Capillary 72  70 - 99 mg/dL  CBC     Status: Abnormal   Collection Time    03/17/13  8:00 AM      Result Value Range   WBC 8.6  4.0 - 10.5 K/uL   RBC 3.75 (*) 4.22 - 5.81 MIL/uL   Hemoglobin 11.8 (*) 13.0 - 17.0 g/dL   HCT 34.0 (*) 39.0 - 52.0 %   MCV 90.7  78.0 - 100.0 fL   MCH 31.5  26.0 - 34.0 pg   MCHC 34.7  30.0 - 36.0 g/dL   RDW 12.8  11.5 - 15.5 %   Platelets 207  150 - 400 K/uL  COMPREHENSIVE METABOLIC PANEL     Status: Abnormal   Collection Time    03/17/13  8:00 AM      Result Value Range   Sodium 140  135 - 145 mEq/L   Potassium 4.5  3.5 - 5.1 mEq/L   Chloride 105  96 - 112 mEq/L   CO2 26  19 - 32 mEq/L   Glucose, Bld 113 (*) 70 - 99 mg/dL   BUN 30 (*) 6 - 23 mg/dL   Creatinine, Ser 2.13 (*) 0.50 - 1.35 mg/dL   Calcium 9.7  8.4 - 10.5 mg/dL   Total Protein 7.2  6.0 - 8.3 g/dL   Albumin 3.8  3.5 - 5.2 g/dL   AST  19  0 - 37 U/L   ALT 20  0 - 53 U/L   Alkaline Phosphatase 86  39 - 117 U/L   Total Bilirubin 0.4  0.3 - 1.2 mg/dL   GFR calc non Af Amer 30 (*) >90 mL/min   GFR calc Af Amer 34 (*) >90 mL/min   Comment:  The eGFR has been calculated     using the CKD EPI equation.     This calculation has not been     validated in all clinical     situations.     eGFR's persistently     <90 mL/min signify     possible Chronic Kidney Disease.  TROPONIN I     Status: None   Collection Time    03/17/13  8:00 AM      Result Value Range   Troponin I <0.30  <0.30 ng/mL   Comment:            Due to the release kinetics of cTnI,     a negative result within the first hours     of the onset of symptoms does not rule out     myocardial infarction with certainty.     If myocardial infarction is still suspected,     repeat the test at appropriate intervals.  LIPID PANEL     Status: None   Collection Time    03/17/13  8:00 AM      Result Value Range   Cholesterol 96  0 - 200 mg/dL   Triglycerides 75  <150 mg/dL   HDL 51  >39 mg/dL   Total CHOL/HDL Ratio 1.9     VLDL 15  0 - 40 mg/dL   LDL Cholesterol 30  0 - 99 mg/dL   Comment:            Total Cholesterol/HDL:CHD Risk     Coronary Heart Disease Risk Table                         Men   Women      1/2 Average Risk   3.4   3.3      Average Risk       5.0   4.4      2 X Average Risk   9.6   7.1      3 X Average Risk  23.4   11.0                Use the calculated Patient Ratio     above and the CHD Risk Table     to determine the patient's CHD Risk.                ATP III CLASSIFICATION (LDL):      <100     mg/dL   Optimal      100-129  mg/dL   Near or Above                        Optimal      130-159  mg/dL   Borderline      160-189  mg/dL   High      >190     mg/dL   Very High  GLUCOSE, CAPILLARY     Status: Abnormal   Collection Time    03/17/13 11:25 AM      Result Value Range   Glucose-Capillary 107 (*) 70 - 99 mg/dL    Comment 1 Notify RN     Comment 2 Documented in Chart    TROPONIN I     Status: None   Collection Time    03/17/13 11:39 AM      Result Value Range   Troponin I <0.30  <0.30 ng/mL   Comment:  Due to the release kinetics of cTnI,     a negative result within the first hours     of the onset of symptoms does not rule out     myocardial infarction with certainty.     If myocardial infarction is still suspected,     repeat the test at appropriate intervals.   Dg Chest 2 View  03/17/2013  *RADIOLOGY REPORT*  Clinical Data: Chest pain.  CHEST - 2 VIEW  Comparison: Chest x-ray 02/10/2012.  Findings: Lung volumes are normal.  No consolidative airspace disease.  No pleural effusions.  No pneumothorax.  No pulmonary nodule or mass noted.  Pulmonary vasculature and the cardiomediastinal silhouette are within normal limits. Atherosclerosis in the thoracic aorta.  Status post median sternotomy for CABG.  Surgical clips project over the right upper quadrant of the abdomen, compatible with prior cholecystectomy.  IMPRESSION: 1. No radiographic evidence of acute cardiopulmonary disease. 2.  Atherosclerosis. 3.  Postoperative changes, as above.   Original Report Authenticated By: Vinnie Langton, M.D.     ROS: General:no colds or fevers, no weight changes Skin:no rashes or ulcers HEENT:no blurred vision, mild congestion with post nasal drip CV:see HPI PUL:see HPI GI:no diarrhea constipation or melena, no indigestion GU:no hematuria, no dysuria MS:no joint pain, no claudication Neuro:no syncope, no lightheadedness, did complain of paresthesias Endo:+ diabetes, no thyroid disease   Blood pressure 135/54, pulse 52, temperature 97.7 F (36.5 C), temperature source Oral, resp. rate 18, height 5\' 7"  (1.702 m), weight 93.759 kg (206 lb 11.2 oz), SpO2 95.00%. PE: Per Dr. Ellyn Hack  Assessment/Plan Principal Problem:   Chest pain Active Problems:   CKD (chronic kidney disease) stage 4, GFR 15-29  ml/min   Coronary artery disease, CABG 1991 and re-do in 2002   Diabetes   Hyperlipidemia LDL goal < 100   Vitamin B 12 deficiency  PLAN: admitted after chest pain assoc. With SOB for 3 evenings.  Troponins are negative.  Dr. Ellyn Hack will proceed with lexiscan myoview tomorrow due to CKD with cr. 2.13.  LDL is well controlled at 30.  Not on BB secondary to bradycardia.  Cecilie Kicks R. NP 03/17/2013 1210 PM [id number]  Time including MD time 30min - 15 minutes with consultation.  I have seen and evaluated the patient this AM along with Cecilie Kicks, NP. I agree with her findings, examination as well as impression recommendations.   I just met Mr. Cossairt earlier this week during a routine clinic visit, I am familiar with his extensive PMH.  He is ~15lb up in weight & had been noting more DOE of late, but denied any CP Sx.  His BP has been higher of late as well.  The night after his clinic visit & several nights following he has noted a strange pressure across his lower chest.  We reviewed his prior Sx for at the time of both CABGs & the PCI in 2003 -- he does not recall having chest discomfort during those occasions.  He did note some SOB, but not much above baseline. He is not on a BB due to bradycardia.  His Exam is benign: General appearance: alert, cooperative, appears stated age, no distress and mildly obese Neck: no adenopathy, no carotid bruit, no JVD, supple, symmetrical, trachea midline and thyroid not enlarged, symmetric, no tenderness/mass/nodules Lungs: clear to auscultation bilaterally, normal percussion bilaterally and non-labored Heart: regular rate and rhythm, S1, S2 normal, no murmur, click, rub or gallop Abdomen: soft, non-tender; bowel sounds normal; no  masses,  no organomegaly Extremities: extremities normal, atraumatic, no cyanosis or edema Pulses: 2+ and symmetric Neurologic: Alert and oriented X 3, normal strength and tone. Normal symmetric reflexes. Normal  coordination and gait  ECG without ischemic changes & Troponin is negative.    After a long discussion re potential course of action, discussing Lexiscan Cardiolite ST vs. Cardiac catheterization.  While cath would be more definitive, the concern is with his CKD-4 & risk of Contrast Induced Nephropathy resulting in Acute on Chronic Renal failure.  While his Sx do seem somewhat classic for angina, they do not correlate with his prior anginal equivalent symptoms.  We have agreed to start with [facility] (with painful arthritic Knees, he would not be able to ambulate) to guide future treatment -- his most recent ST did not have any notable perfusion defects, so I will have a low threshold to procced with cardiac catheterization if there is an abnormality.  For now would continue Heparin & NTG paste until tomorrow.  If he has recurrent CP or SOB o/n, would have a low threshold for converting to an early invasive evaluation with Coronary-Graft Angiography (without LV Gram - so would order Echo).  [facility] MDs have admitted the patient,  I will happily assume the role as primary attending, but would appreciate there assistance with Diabetes care with combination of oral meds & Sub @ insulin.  Would hold metformin until we are sure of +/- Cath.    If we were to proceed towards Angiography, would ned to pre-hydrate overnight & strongly consider a staged approach of Diagnostic one day & PCI in 1-2 days.   Leonie Man, M.D., M.S. [facility] [address] [place], [place]  [address]  [phone] Pager # [phone] 03/17/2013 12:50 PM

## 2013-03-17 NOTE — Progress Notes (Signed)
[facility] for Lovenox Indication: chest pain/ACS  Allergies  Allergen Reactions  . Food     Pt states he is not allergic to gluten   . No Known Drug Allergy     Patient Measurements: Height: 5\' 7"  (170.2 cm) Weight: 206 lb 11.2 oz (93.759 kg) IBW/kg (Calculated) : 66.1 Heparin Dosing Weight: 94 kg  Vital Signs: Temp: 97.7 F (36.5 C) (03/30 0426) Temp src: Oral (03/30 0426) BP: 135/54 mmHg (03/30 0426) Pulse Rate: 52 (03/30 0426)  Labs:  Recent Labs  03/17/13 0023 03/17/13 0800 03/17/13 1139  HGB 12.1* 11.8*  --   HCT 35.6* 34.0*  --   PLT 224 207  --   CREATININE 2.22* 2.13*  --   TROPONINI  --  <0.30 <0.30    Estimated Creatinine Clearance: 35.2 ml/min (by C-G formula based on Cr of 2.13).  Assessment:  Extensive cardiac history, CABG in 1991, re-do in 2000, PCI in 2003.  Admitted last night with chest pain. Enzymes negative. EKG noted without ischemic changes.  Lovenox 85 mg SQ given ~4am today; to continue SQ with 1 mg/kg (95 mg) SQ q12hrs. Weight up ~15 lbs from last recorded. CBC ok.  Rx may adjust Lovenox if needed. Plan Lexiscan myoview on 3/31, but may need cardiac cath.   CKD, baseline creatinine about 2.0. Crcl just > 30 ml/min, threshold for decreasing Lovenox to q24h dosing.  Plavix 300 mg loading dose given 3/30 ~2am. No maintenance dose ordered yet.  Goal of Therapy:  Anti-Xa level 0.6-1.2 units/ml 4hrs after LMWH dose given Monitor platelets by anticoagulation protocol: Yes   Plan:  Continue Lovenox 95 mg SQ q12hrs for now. CBC in am, and at least every 3 days while on Lovenox. Follow up renal function for any need to extend Lovenox dosing interval, or adjust dose for weight. Bmet in am.  Arty Baumgartner, [place] Pager: [phone] 03/17/2013,1:37 PM

## 2013-03-17 NOTE — ED Notes (Signed)
Pt transported to Xray. 

## 2013-03-17 NOTE — ED Provider Notes (Signed)
History     CSN: [id number]  Arrival date & time 03/17/13  0006   First MD Initiated Contact with Patient 03/17/13 0037      Chief Complaint  Patient presents with  . Chest Pain    (Consider location/radiation/quality/duration/timing/severity/associated sxs/prior treatment) HPI  This 72 yo man with an extensive history of CAD - s/p CABG 1991 with redo in 2000 followed by PCI with stent in 2003. Last functional study > 2 years ago, per patient.   Patient presents with complaints of chest discomfort while at rest for the past 3 nights. Diffuse chest tightness and pressure. Pain 10/10 at it's worst earlier tonight. Episode of CP lasted approx 2 hrs total and resolved as patient was on the way to the hospital. He took an ASA PTA. He does not take NTG.   He denies associated SOB, diaphoresis, nausea, near syncope, LE edema. Pain has been nonradiating. Patient denies history of similar sx. Says he has no history of angina.    Past Medical History  Diagnosis Date  . S/P CABG x 5 1991  . S/P CABG x 4 2000  . Hypertension   . Blood transfusion 2010  . Diabetes mellitus   . Coronary artery disease     last cath 2006-patent grafts, last nuc 09/10/09-no ischemia  . MI, old   . Peripheral vascular disease     peripheral neuropathy  . Myocardial infarction 2000    LOV with EKG Dr Rex Kras 7/12 on chart, eccho 10/03, stress test 9/10 on chart, chest x ray 2/13 EPIC, chest CT 02/13/12 on chart  . Chronic kidney disease     kidney stones,cmet 02/09/12, 02/13/12 on chart  . Hx of echocardiogram 10/14/2002    EF 50-55%, no valve issues  . Claudication     LE dopplers 03/17/10, normal ABIs, normal pressures    Past Surgical History  Procedure Laterality Date  . Cholecystectomy    . Back surgery  2009, 2010  . Coronary artery bypass graft  1991  . 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 Cystoscopy/retrograde/ureteroscopy/stone extraction with basket  02/16/2012    Procedure:  CYSTOSCOPY/RETROGRADE/URETEROSCOPY/STONE EXTRACTION WITH BASKET;  Surgeon: Malka So, MD;  Location: [facility];  Service: Urology;  Laterality: Left;  Left Ureterscopy with Stone Extraction  . Cardiac catheterization  06/04/1990    severe 3 vessel dz  . Cardiac catheterization  01/13/97    patent grafts, nl LV function  . Cardiac catheterization  01/28/1999    severe native CAD with disease involving all 3 SVG, recommend redo CABG  . Cardiac catheterization  05/16/2002    patent graft except of mod to sever stenosis in the midprotion of the RIMA to the posterior descending artery  . Coronary angioplasty with stent placement  05/22/2002    3.0x75mm cypher stent covered the proximal and distal portion of RIMA, final inflation -16x60  . Cardiac catheterization  08/28/2003    nl lv fxn, patent grafts to all vessels with small vessel disease  . Cardiac catheterization  07/07/2005    patent grafts, EF 45-50%  . Coronary artery bypass graft  01/25/1999    redo CABG, sequential reverse SVG to the first dia and disstal circ, reverse SVG to third diaf, RIMA to posterior descending cor artery     No family history on file.  History  Substance Use Topics  . Smoking status: Never Smoker   . Smokeless tobacco: Not on file  .  Alcohol Use: No      Review of Systems Gen: no weight loss, fevers, chills, night sweats Eyes: no discharge or drainage, no occular pain or visual changes Nose: no epistaxis or rhinorrhea Mouth: no dental pain, no sore throat Neck: no neck pain Lungs: no SOB, cough, wheezing CV: as per history of present illness, otherwise negative Abd: no abdominal pain, nausea, vomiting GU: no dysuria or gross hematuria MSK: no myalgias or arthralgias Neuro: no headache, no focal neurologic deficits Skin: no rash Psyche: negative.  Allergies  Food  Home Medications   Current Outpatient Rx  Name  Route  Sig  Dispense  Refill  . amLODipine (NORVASC) 5 MG tablet   Oral    Take 5 mg by mouth at bedtime.          . beta carotene w/minerals (OCUVITE) tablet   Oral   Take 1 tablet by mouth daily.         . cyanocobalamin (,VITAMIN B-12,) 1000 MCG/ML injection   Intramuscular   Inject 1,000 mcg into the muscle every 30 (thirty) days. Next due 02/18/12         . glipiZIDE (GLUCOTROL) 10 MG tablet   Oral   Take 10 mg by mouth 2 (two) times daily before a meal. STATES IS Taking 5 mg every morning since 02/06/12         . LORazepam (ATIVAN) 0.5 MG tablet   Oral   Take 0.5 mg by mouth daily.         . metroNIDAZOLE (FLAGYL) 500 MG tablet   Oral   Take 500 mg by mouth 3 (three) times daily. For 21 days.         . phenazopyridine (PYRIDIUM) 200 MG tablet   Oral   Take 200 mg by mouth 3 (three) times daily as needed.         . pregabalin (LYRICA) 100 MG capsule   Oral   Take 100 mg by mouth 3 (three) times daily.         . Probiotic Product (PHILLIPS COLON HEALTH PO)   Oral   Take 1 tablet by mouth daily.         . rosuvastatin (CRESTOR) 40 MG tablet   Oral   Take 40 mg by mouth daily.         . sildenafil (VIAGRA) 100 MG tablet   Oral   Take 100 mg by mouth as needed.         . sitaGLIPtan-metformin (JANUMET) 50-1000 MG per tablet   Oral   Take 1 tablet by mouth 2 (two) times daily with a meal.         . sitaGLIPtin (JANUVIA) 50 MG tablet   Oral   Take 50 mg by mouth daily.         Marland Kitchen telmisartan-hydrochlorothiazide (MICARDIS HCT) 80-25 MG per tablet   Oral   Take 1 tablet by mouth daily.         Marland Kitchen zolpidem (AMBIEN) 10 MG tablet   Oral   Take 10 mg by mouth at bedtime. For sleep          * PATIENT SAYS HE IS NO LONGER TAKING FLAGYL, PYRIDIUM, JANUMET. HE TAKES SIALIS PRN BUT NOT VIAGRA. LAST DOSE OF SIALIS WAS 1 WEEK AGO.   BP 174/66  Pulse 60  Temp(Src) 97.5 F (36.4 C) (Oral)  Resp 18  SpO2 96%  Physical Exam Gen: well developed and well nourished appearing Head: NCAT  Eyes: PERL, EOMI Nose: no  epistaixis or rhinorrhea Mouth/throat: mucosa is moist and pink Neck: supple, no stridor Lungs: CTA B, no wheezing, rhonchi or rales CV: RRR, no murmur, no jvd, n o le edema Abd: soft, notender, nondistended Back: no ttp, no cva ttp Skin: no rashese, wnl Neuro: CN ii-xii grossly intact, no focal deficits Psyche; normal affect,  calm and cooperative.   ED Course  Procedures (including critical care time)  Results for orders placed during the hospital encounter of 03/17/13 (from the past 24 hour(s))  CBC     Status: Abnormal   Collection Time    03/17/13 12:23 AM      Result Value Range   WBC 9.8  4.0 - 10.5 K/uL   RBC 3.95 (*) 4.22 - 5.81 MIL/uL   Hemoglobin 12.1 (*) 13.0 - 17.0 g/dL   HCT 35.6 (*) 39.0 - 52.0 %   MCV 90.1  78.0 - 100.0 fL   MCH 30.6  26.0 - 34.0 pg   MCHC 34.0  30.0 - 36.0 g/dL   RDW 12.7  11.5 - 15.5 %   Platelets 224  150 - 400 K/uL  BASIC METABOLIC PANEL     Status: Abnormal   Collection Time    03/17/13 12:23 AM      Result Value Range   Sodium 138  135 - 145 mEq/L   Potassium 4.0  3.5 - 5.1 mEq/L   Chloride 102  96 - 112 mEq/L   CO2 25  19 - 32 mEq/L   Glucose, Bld 145 (*) 70 - 99 mg/dL   BUN 31 (*) 6 - 23 mg/dL   Creatinine, Ser 2.22 (*) 0.50 - 1.35 mg/dL   Calcium 9.6  8.4 - 10.5 mg/dL   GFR calc non Af Amer 28 (*) >90 mL/min   GFR calc Af Amer 33 (*) >90 mL/min  PRO B NATRIURETIC PEPTIDE     Status: None   Collection Time    03/17/13 12:23 AM      Result Value Range   Pro B Natriuretic peptide (BNP) 83.3  0 - 125 pg/mL   EKG: nsr, no acute ischemic changes, normal intervals, normal axis, normal qrs complex  CXR: normal cardiac silloute, normal appearing mediastinum, no infiltrates, no acute process identified.   DDX: ACS, pneumothorax, pneumonia, pericardial or pleural effusion, gastritis, GERD/PUD, musculoskeletal pain.   MDM  We are treating with another 162 mg of ASA along with Plavix and will anticoagulate with Lovenox. Patient is  pain free and we have placed nitropaste.  Case discussed with Dr. Ellyn Hack, the patient's cardiologist. He agrees with plan. He asks that medicine admit with plan to consult [facility].        Elyn Peers, MD 04/03/13 [phone]

## 2013-03-17 NOTE — H&P (Addendum)
PCP:   Janalyn Rouse, MD   Chief Complaint:  Chest pain  HPI: Patient is a 72 year old male with a considerable history of CAD s/p CABG who is followed at [facility]  [facility] with chest pain beginning before 11pm last night which was localized in his left/mid chest.  He associates this with weakness and some shortness of breath.  He has also had pain of this nature for the past 3 nights but elected to come to the ER this evening.  He was given NTG and was pain free in the ER.  Troponin remained negative and the ER MD called [facility] for admission.  They have chosen to defer this to Internal Medicine tonight and I was asked to admit the patient for further chest pain workup.  Of note, he saw Posey Pronto with Renal, on Monday,and Harding (Al Little's replacement at [facility]) last Tuesday.  Had a small amount of pain this AM and has NTG paste applied.  Review of Systems  General:       Denies headache, weight loss.   Eyes:       Denies vision loss.   Ears/Nose/Throat:       Post nasal drainage Cardiovascular:       Positive for chest pains, dyspnea on exertion,  No peripheral edema.   Respiratory:       Short of breath.   Gastrointestinal:       Denies diarrhea, constipation, heartburn.   Genitourinary:       Denies urinary frequency, erectile dysfunction.   Musculoskeletal:       Denies joint pain.   Skin:       Denies suspicious lesions.   Neurologic:       Complains of paresthesias.   Endocrine:       Denies polydipsia, polyuria.   Heme/Lymphatic:       Denies bleeding.   Allergic/Immunologic:       Denies hay fever.  Past Medical History: Past Medical History  Diagnosis Date  . S/P CABG x 5 1991  . S/P CABG x 4 2000  . Hypertension   . Blood transfusion 2010  . Diabetes mellitus   . Coronary artery disease     last cath 2006-patent grafts, last nuc 09/10/09-no ischemia  . MI, old   . Peripheral vascular disease     peripheral  neuropathy  . Myocardial infarction 2000    LOV with EKG Dr Rex Kras 7/12 on chart, eccho 10/03, stress test 9/10 on chart, chest x ray 2/13 EPIC, chest CT 02/13/12 on chart  . Chronic kidney disease     kidney stones,cmet 02/09/12, 02/13/12 on chart  . Hx of echocardiogram 10/14/2002    EF 50-55%, no valve issues  . Claudication     LE dopplers 03/17/10, normal ABIs, normal pressures   Past Surgical History  Procedure Laterality Date  . Cholecystectomy    . Back surgery  2009, 2010  . Coronary artery bypass graft  1991  . 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 Cystoscopy/retrograde/ureteroscopy/stone extraction with basket  02/16/2012    Procedure: CYSTOSCOPY/RETROGRADE/URETEROSCOPY/STONE EXTRACTION WITH BASKET;  Surgeon: Malka So, MD;  Location: [facility];  Service: Urology;  Laterality: Left;  Left Ureterscopy with Stone Extraction  . Cardiac catheterization  06/04/1990    severe 3 vessel dz  . Cardiac catheterization  01/13/97    patent grafts,  nl LV function  . Cardiac catheterization  01/28/1999    severe native CAD with disease involving all 3 SVG, recommend redo CABG  . Cardiac catheterization  05/16/2002    patent graft except of mod to sever stenosis in the midprotion of the RIMA to the posterior descending artery  . Coronary angioplasty with stent placement  05/22/2002    3.0x20mm cypher stent covered the proximal and distal portion of RIMA, final inflation -16x60  . Cardiac catheterization  08/28/2003    nl lv fxn, patent grafts to all vessels with small vessel disease  . Cardiac catheterization  07/07/2005    patent grafts, EF 45-50%  . Coronary artery bypass graft  01/25/1999    redo CABG, sequential reverse SVG to the first dia and disstal circ, reverse SVG to third diaf, RIMA to posterior descending cor artery     Medications: Prior to Admission medications   Medication Sig Start Date End Date Taking? Authorizing Provider  amLODipine (NORVASC) 10 MG tablet Take 10  mg by mouth daily.   Yes Historical Provider, MD  cyanocobalamin (,VITAMIN B-12,) 1000 MCG/ML injection Inject 1,000 mcg into the muscle every 30 (thirty) days. Next due 02/18/12   Yes Historical Provider, MD  glipiZIDE (GLUCOTROL) 10 MG tablet Take 10 mg by mouth 2 (two) times daily before a meal. STATES IS Taking 5 mg every morning since 02/06/12   Yes Historical Provider, MD  insulin glargine (LANTUS) 100 UNIT/ML injection Inject 16 Units into the skin daily.   Yes Historical Provider, MD  LORazepam (ATIVAN) 0.5 MG tablet Take 0.5 mg by mouth daily.   Yes Historical Provider, MD  pregabalin (LYRICA) 75 MG capsule Take 75 mg by mouth 3 (three) times daily.   Yes Historical Provider, MD  rosuvastatin (CRESTOR) 20 MG tablet Take 20 mg by mouth daily.   Yes Historical Provider, MD  sitaGLIPtin (JANUVIA) 50 MG tablet Take 50 mg by mouth daily.   Yes Historical Provider, MD  tadalafil (CIALIS) 5 MG tablet Take 5 mg by mouth daily as needed for erectile dysfunction.   Yes Historical Provider, MD  telmisartan-hydrochlorothiazide (MICARDIS HCT) 80-25 MG per tablet Take 1 tablet by mouth daily.   Yes Historical Provider, MD  Vitamin D, Ergocalciferol, (DRISDOL) 50000 UNITS CAPS Take 50,000 Units by mouth every 7 (seven) days. Takes on Friday   Yes Historical Provider, MD  zolpidem (AMBIEN CR) 12.5 MG CR tablet Take 12.5 mg by mouth at bedtime.   Yes Historical Provider, MD    Allergies:   Allergies  Allergen Reactions  . Food     gluten    Social History:  reports that he has never smoked. He does not have any smokeless tobacco history on file. He reports that he does not drink alcohol or use illicit drugs.  Family History: No family history on file.  Physical Exam: Filed Vitals:   03/17/13 0226 03/17/13 0230 03/17/13 0245 03/17/13 0300  BP:  134/69 154/63 144/67  Pulse: 59 53 51 52  Temp:      TempSrc:      Resp: 13 22 18 19   SpO2: 97% 95% 95% 96%   General appearance: alert, cooperative  and appears stated age Head: Normocephalic, without obvious abnormality, atraumatic Eyes: conjunctivae/corneas clear. PERRL, EOM's intact.  Nose: Nares normal. Septum midline. Mucosa normal. No drainage or sinus tenderness. Throat: lips, mucosa, and tongue normal; teeth and gums normal Neck: no adenopathy, no carotid bruit, no JVD and thyroid not enlarged, symmetric, no  tenderness/mass/nodules Resp: clear to auscultation bilaterally Cardio: regular rate and rhythm, S1, S2 normal, no murmur, click, rub or gallop GI: soft, non-tender; bowel sounds normal; no masses,  no organomegaly Extremities: extremities normal, atraumatic, no cyanosis or edema Pulses: 2+ and symmetric Lymph nodes: Cervical adenopathy: no cervical lymphadenopathy Neurologic: Alert and oriented X 3, normal strength and tone. Normal symmetric reflexes.     Labs on Admission:   Recent Labs  03/17/13 0023  NA 138  K 4.0  CL 102  CO2 25  GLUCOSE 145*  BUN 31*  CREATININE 2.22*  CALCIUM 9.6   Recent Labs  03/17/13 0023  WBC 9.8  HGB 12.1*  HCT 35.6*  MCV 90.1  PLT 224    Radiological Exams on Admission: Dg Chest 2 View  03/17/2013  *RADIOLOGY REPORT*  Clinical Data: Chest pain.  CHEST - 2 VIEW  Comparison: Chest x-ray 02/10/2012.  Findings: Lung volumes are normal.  No consolidative airspace disease.  No pleural effusions.  No pneumothorax.  No pulmonary nodule or mass noted.  Pulmonary vasculature and the cardiomediastinal silhouette are within normal limits. Atherosclerosis in the thoracic aorta.  Status post median sternotomy for CABG.  Surgical clips project over the right upper quadrant of the abdomen, compatible with prior cholecystectomy.  IMPRESSION: 1. No radiographic evidence of acute cardiopulmonary disease. 2.  Atherosclerosis. 3.  Postoperative changes, as above.   Original Report Authenticated By: Vinnie Langton, M.D.    Orders placed during the hospital encounter of 03/17/13  . ED EKG  . EKG  12-LEAD  . EKG 12-LEAD  . ED EKG    Assessment/Plan Chest pain, possible unstable angina.  Given Plavix, ASA, Lovenox and NTG in ER.  Cards opts to see in the morning.  I will admit to telemetry and continue to follow his troponins and repeat an EKG at 6am.  Defer all management to Cards given his extensive history. DM2 with renal, neurologic manifestations  Continue Lantus, Glipizide, Tradjenta and SSI Neuropathy- Lyrica CKD 3  His baseline creatinine is about 2.0, will hydrate as slightly higher currently. HTN- HR <60, continue ARB Hyperlipidemia WIlluse full dose Lipitor as his formulary statin replacement CAD s/p MI, CABG (1991, 2000), s/p PCI (2003) Spinal Stenosis- PRN pain meds Insomnia- Ambien B12 deficiency on monthly B12 shots, check level  Addendum 3/30 1:48pm:  Holding Glipizide due to NPO tomorrow morning with expected Cardiolyte.  Yoshika Vensel W 03/17/2013, 3:43 AM

## 2013-03-17 NOTE — ED Notes (Signed)
I-stat Troponin 0.00 per [facility]. EDP aware

## 2013-03-17 NOTE — ED Notes (Signed)
Pt states non radiating Chest pressure around 10:50, with weakness and SOB with activity. Denies N/V, diaphoresis. Pressure relived by acid pills, Pressure exacerbated by nothing.

## 2013-03-18 ENCOUNTER — Other Ambulatory Visit: Payer: Self-pay

## 2013-03-18 ENCOUNTER — Inpatient Hospital Stay (HOSPITAL_COMMUNITY): Payer: Medicare Other

## 2013-03-18 LAB — BASIC METABOLIC PANEL
BUN: 31 mg/dL — ABNORMAL HIGH (ref 6–23)
GFR calc Af Amer: 36 mL/min — ABNORMAL LOW (ref >90)
GFR calc non Af Amer: 31 mL/min — ABNORMAL LOW (ref >90)
Potassium: 4.8 mEq/L (ref 3.5–5.1)
Sodium: 142 mEq/L (ref 135–145)

## 2013-03-18 LAB — CBC
HCT: 33.3 % — ABNORMAL LOW (ref 39.0–52.0)
MCHC: 35.1 g/dL (ref 30.0–36.0)
RDW: 12.8 % (ref 11.5–15.5)

## 2013-03-18 LAB — GLUCOSE, CAPILLARY: Glucose-Capillary: 115 mg/dL — ABNORMAL HIGH (ref 70–99)

## 2013-03-18 MED ORDER — DEXTROSE 50 % IV SOLN
INTRAVENOUS | Status: AC
  Administered 2013-03-18: 50 mL
  Filled 2013-03-18: qty 50

## 2013-03-18 MED ORDER — TECHNETIUM TC 99M SESTAMIBI - CARDIOLITE
30.0000 | Freq: Once | INTRAVENOUS | Status: AC | PRN
  Administered 2013-03-18: 30 via INTRAVENOUS

## 2013-03-18 MED ORDER — TECHNETIUM TC 99M SESTAMIBI GENERIC - CARDIOLITE
10.0000 | Freq: Once | INTRAVENOUS | Status: AC | PRN
  Administered 2013-03-18: 10 via INTRAVENOUS

## 2013-03-18 MED ORDER — PANTOPRAZOLE SODIUM 40 MG PO TBEC
40.0000 mg | DELAYED_RELEASE_TABLET | Freq: Every day | ORAL | Status: DC
  Administered 2013-03-18: 40 mg via ORAL
  Filled 2013-03-18: qty 1

## 2013-03-18 MED ORDER — REGADENOSON 0.4 MG/5ML IV SOLN
0.4000 mg | Freq: Once | INTRAVENOUS | Status: AC
  Administered 2013-03-18: 0.4 mg via INTRAVENOUS
  Filled 2013-03-18: qty 5

## 2013-03-18 NOTE — Discharge Summary (Signed)
Mr Dohrn has not had any further CP.   Lexiscan Myoview was negative for ischemia.  HE is ready for d/c.  I will see him back as scheduled above.  Leonie Man, M.D., M.S. [facility] [address] [place], [place]  [address]  [phone] Pager # [phone] 03/18/2013 6:15 PM

## 2013-03-18 NOTE — Progress Notes (Signed)
Lexiscan myoview completed without complications.  OK to resume diet.

## 2013-03-18 NOTE — Progress Notes (Signed)
Subjective: History reviewed with patient- describes intermittent tightness in chest that is related to lying down.  No clear exertional component.  No chest wall tenderness.  No definate reflux symptoms.   Objective: Vital signs in last 24 hours: Temp:  [97.6 F (36.4 C)-98.7 F (37.1 C)] 97.6 F (36.4 C) (03/31 0522) Pulse Rate:  [59-62] 61 (03/31 0522) Resp:  [18-20] 20 (03/31 0522) BP: (127-141)/(54-59) 127/58 mmHg (03/31 0522) SpO2:  [96 %-98 %] 98 % (03/31 0522) Weight:  [93.895 kg (207 lb)] 93.895 kg (207 lb) (03/31 0500) Weight change: 0.136 kg (4.8 oz)    CBG (last 3)   Recent Labs  03/17/13 2122 03/18/13 0608 03/18/13 0720  GLUCAP 139* 66* 115*    Intake/Output from previous day:   Intake/Output this shift:    General appearance: alert and no distress Eyes: no scleral icterus Throat: oropharynx moist without erythema Resp: clear to auscultation bilaterally Cardio: regular rate and rhythm GI: soft, non-tender; bowel sounds normal; no masses,  no organomegaly Extremities: no clubbing, cyanosis or edema   Lab Results:  Recent Labs  03/17/13 0800 03/18/13 0540  NA 140 142  K 4.5 4.8  CL 105 108  CO2 26 24  GLUCOSE 113* 69*  BUN 30* 31*  CREATININE 2.13* 2.06*  CALCIUM 9.7 9.4    Recent Labs  03/17/13 0800  AST 19  ALT 20  ALKPHOS 86  BILITOT 0.4  PROT 7.2  ALBUMIN 3.8    Recent Labs  03/17/13 0800 03/18/13 0540  WBC 8.6 9.3  HGB 11.8* 11.7*  HCT 34.0* 33.3*  MCV 90.7 88.1  PLT 207 206   No results found for this basename: INR, PROTIME    Recent Labs  03/17/13 0800 03/17/13 1139 03/17/13 1803  TROPONINI <0.30 <0.30 <0.30   No results found for this basename: TSH, T4TOTAL, FREET3, T3FREE, THYROIDAB,  in the last 72 hours  Recent Labs  03/17/13 0800  VITAMINB12 615    Studies/Results: Dg Chest 2 View  03/17/2013  *RADIOLOGY REPORT*  Clinical Data: Chest pain.  CHEST - 2 VIEW  Comparison: Chest x-ray 02/10/2012.   Findings: Lung volumes are normal.  No consolidative airspace disease.  No pleural effusions.  No pneumothorax.  No pulmonary nodule or mass noted.  Pulmonary vasculature and the cardiomediastinal silhouette are within normal limits. Atherosclerosis in the thoracic aorta.  Status post median sternotomy for CABG.  Surgical clips project over the right upper quadrant of the abdomen, compatible with prior cholecystectomy.  IMPRESSION: 1. No radiographic evidence of acute cardiopulmonary disease. 2.  Atherosclerosis. 3.  Postoperative changes, as above.   Original Report Authenticated By: Vinnie Langton, M.D.      Medications: Scheduled: . amLODipine  10 mg Oral Daily  . aspirin EC  325 mg Oral Daily  . atorvastatin  80 mg Oral q1800  . cyanocobalamin  1,000 mcg Intramuscular Q30 days  . docusate sodium  100 mg Oral BID  . enoxaparin (LOVENOX) injection  1 mg/kg Subcutaneous Q12H  . insulin aspart  0-15 Units Subcutaneous TID WC  . insulin glargine  16 Units Subcutaneous Daily  . irbesartan  300 mg Oral Daily  . linagliptin  5 mg Oral Daily  . pregabalin  75 mg Oral TID  . sodium chloride  3 mL Intravenous Q12H  . Vitamin D (Ergocalciferol)  50,000 Units Oral Q7 days   Continuous: . 0.9 % NaCl with KCl 20 mEq / L 75 mL/hr at 03/17/13 2213    Assessment/Plan:  Principal Problem: 1. Chest pain- appreciate cardiology primary management (changed attending to Ellyn Hack, MD) and guidance.  Anticipate cardiolyte today.  History is atypical but high risk for recurrent CAD.  Will add PPI if cardiolyte negative.    Active Problems: 2. Coronary artery disease, CABG 1991 and re-do in 2002- continue management per Cards 3. Diabetes Mellitus with Renal, Optho complications- Glipizide on hold due to NPO status.  Resume after discharge.  Continue Tradjenta (Januvia at home) and Lantus.  Mild hypoglycemia this am but not at home.   4. CKD (chronic kidney disease) stage 4, GFR 15-29 ml/min- renal function  stable with hydration.   5. Hyperlipidemia-- continue statin. LDL at goal less than 70. 6. Disposition- anticipate discharge after cardiolyte if negative per Cardiology.  I will follow-up with him when he returns from the beach in 3 weeks.      LOS: 1 day   Dossie Ocanas,W DOUGLAS 03/18/2013, 7:31 AM

## 2013-03-18 NOTE — Discharge Summary (Signed)
Physician Discharge Summary  Patient ID: Allen Medina MRN: [id number] DOB/AGE: [birth year] 72 y.o.  Admit date: 03/17/2013 Discharge date: 03/18/2013  Admission Diagnoses: Chest Pain  Discharge Diagnoses:  Principal Problem:   Chest pain Active Problems:   Coronary artery disease, CABG 1991 and re-do in 2002   Diabetes   CKD (chronic kidney disease) stage 4, GFR 15-29 ml/min   Hyperlipidemia LDL goal < 100   Vitamin B 12 deficiency   Discharged Condition: stable  Hospital Course: The patient is a 72 y.o. male with a history of CAD, S/P CABG in 1991 and redo in 2000. His last cath was in 2006 with patent grafts. He had a normal nuclear stress test in September 2010. He presented to the [facility] ER on 03/17/13 with ongoing, left-sided chest pain x 2 days with associated weakness and SOB.  The pain was nitrate responsive. His EKG showed no acute changes. Initial troponin was negative. He was admitted for observation. He ultimately ruled out for MI with 3 negative sets of cardiac enzymes. He subsequently underwent a nuclear stress test for risk stratification. The stress test was normal. His chest pain ultimately resolved. He was last seen and examined by Allen Medina who felt he was stable for discharge home. He will follow up with Allen Medina at [facility].   Consults: None  Significant Diagnostic Studies:   Nuclear Stress Test 03/18/13 Comparison: none  Findings:  Technique: Study is adequate.  Perfusion: There are no relative decreased counts on stress or  rest to suggest reversible ischemia. There are decreased counts  within the mid and apical segment of the anterior septal wall which  are fixed on stress which could represent a small infarct or RV  insertion artifact  Wall motion: Septal hypokinesia.  Left ventricular ejection fraction: Calculated left ventricular  ejection fraction = 59%.  IMPRESSION:  1. No reversible ischemia.  2. Septal hypokinesia.  3. Left ventricular ejection  fraction equal59%  Original Report Authenticated By: Allen Medina, M.D.   Treatments: See Hospital Course  Discharge Exam: Blood pressure 138/78, pulse 55, temperature 98.2 F (36.8 C), temperature source Oral, resp. rate 18, height 5\' 7"  (1.702 m), weight 207 lb (93.895 kg), SpO2 98.00%.   Disposition: 01-Home or Self Care  Discharge Orders   Future Appointments Provider Department Dept Phone   03/12/2014 8:00 AM Allen Pedro, Allen Medina [place] [phone]   Future Orders Complete By Expires     Diet - low sodium heart healthy  As directed     Increase activity slowly  As directed         Medication List    TAKE these medications       amLODipine 10 MG tablet  Commonly known as:  NORVASC  Take 10 mg by mouth daily.     cyanocobalamin 1000 MCG/ML injection  Commonly known as:  (VITAMIN B-12)  Inject 1,000 mcg into the muscle every 30 (thirty) days. Next due 02/18/12     glipiZIDE 10 MG tablet  Commonly known as:  GLUCOTROL  Take 10 mg by mouth 2 (two) times daily before a meal. STATES IS Taking 5 mg every morning since 02/06/12     insulin glargine 100 UNIT/ML injection  Commonly known as:  LANTUS  Inject 16 Units into the skin daily.     LORazepam 0.5 MG tablet  Commonly known as:  ATIVAN  Take 0.5 mg by mouth daily.     pregabalin 75 MG capsule  Commonly known as:  LYRICA  Take 75 mg by mouth 3 (three) times daily.     rosuvastatin 20 MG tablet  Commonly known as:  CRESTOR  Take 20 mg by mouth daily.     sitaGLIPtin 50 MG tablet  Commonly known as:  JANUVIA  Take 50 mg by mouth daily.     tadalafil 5 MG tablet  Commonly known as:  CIALIS  Take 5 mg by mouth daily as needed for erectile dysfunction.     telmisartan-hydrochlorothiazide 80-25 MG per tablet  Commonly known as:  MICARDIS HCT  Take 1 tablet by mouth daily.     Vitamin D (Ergocalciferol) 50000 UNITS Caps  Commonly known as:  DRISDOL  Take 50,000 Units by mouth  every 7 (seven) days. Takes on Friday     zolpidem 12.5 MG CR tablet  Commonly known as:  AMBIEN CR  Take 12.5 mg by mouth at bedtime.           Follow-up Information   Follow up with Allen Man, Allen Medina On 04/09/2013. (1:45 pm)    Contact information:   [address] [place] [address] [phone]      TIME SPENT ON DISCHARGE, INCLUDING PHYSICIAN TIME: 30 MINUTES  Signed: Consuelo Pandy, PA-C 03/18/2013, 5:10 PM

## 2013-03-18 NOTE — Progress Notes (Signed)
Pt educated re: ACS and chest pain, AVS printed and gone over with wife and pt. All questions answered, went over follow up appointments. No changes in medication. All belongings sent home with pt.   Rayburn Ma, RN, BSN

## 2013-03-18 NOTE — Progress Notes (Signed)
[facility]  Subjective: Pt reports chest pain last night that was relived with morphine. No further chest pain since then. He denies SOB.  Objective: Vital signs in last 24 hours: Temp:  [97.6 F (36.4 C)-98.7 F (37.1 C)] 97.6 F (36.4 C) (03/31 0522) Pulse Rate:  [59-62] 61 (03/31 0522) Resp:  [18-20] 20 (03/31 0522) BP: (127-141)/(54-59) 127/58 mmHg (03/31 0522) SpO2:  [96 %-98 %] 98 % (03/31 0522) Weight:  [207 lb (93.895 kg)] 207 lb (93.895 kg) (03/31 0500)    Intake/Output from previous day:   Intake/Output this shift:    Medications Current Facility-Administered Medications  Medication Dose Route Frequency Provider Last Rate Last Dose  . 0.9 % NaCl with KCl 20 mEq/ L  infusion   Intravenous Continuous Haywood Pao, MD 75 mL/hr at 03/17/13 2213    . acetaminophen (TYLENOL) tablet 650 mg  650 mg Oral Q6H PRN Haywood Pao, MD       Or  . acetaminophen (TYLENOL) suppository 650 mg  650 mg Rectal Q6H PRN Haywood Pao, MD      . amLODipine (NORVASC) tablet 10 mg  10 mg Oral Daily Haywood Pao, MD   10 mg at 03/17/13 1014  . aspirin EC tablet 325 mg  325 mg Oral Daily Haywood Pao, MD   325 mg at 03/17/13 1013  . atorvastatin (LIPITOR) tablet 80 mg  80 mg Oral q1800 Haywood Pao, MD   80 mg at 03/17/13 1722  . cyanocobalamin ((VITAMIN B-12)) injection 1,000 mcg  1,000 mcg Intramuscular Q30 days Haywood Pao, MD   1,000 mcg at 03/17/13 1014  . docusate sodium (COLACE) capsule 100 mg  100 mg Oral BID Haywood Pao, MD   100 mg at 03/17/13 2213  . enoxaparin (LOVENOX) injection 95 mg  1 mg/kg Subcutaneous Q12H Haywood Pao, MD      . insulin aspart (novoLOG) injection 0-15 Units  0-15 Units Subcutaneous TID WC Haywood Pao, MD      . insulin glargine (LANTUS) injection 16 Units  16 Units Subcutaneous Daily Haywood Pao, MD   16 Units at 03/17/13 1014  . irbesartan (AVAPRO) tablet 300 mg  300 mg  Oral Daily Haywood Pao, MD   300 mg at 03/17/13 1013  . linagliptin (TRADJENTA) tablet 5 mg  5 mg Oral Daily Haywood Pao, MD   5 mg at 03/17/13 1000  . morphine 2 MG/ML injection 1 mg  1 mg Intravenous Q2H PRN Haywood Pao, MD   1 mg at 03/17/13 2020  . nitroGLYCERIN (NITROSTAT) SL tablet 0.4 mg  0.4 mg Sublingual Q5 min PRN Haywood Pao, MD      . ondansetron [facility]) tablet 4 mg  4 mg Oral Q6H PRN Haywood Pao, MD       Or  . ondansetron [facility]) injection 4 mg  4 mg Intravenous Q6H PRN Haywood Pao, MD      . oxyCODONE (Oxy IR/ROXICODONE) immediate release tablet 5 mg  5 mg Oral Q4H PRN Haywood Pao, MD      . pantoprazole (PROTONIX) EC tablet 40 mg  40 mg Oral Q0600 Janalyn Rouse, MD      . pregabalin (LYRICA) capsule 75 mg  75 mg Oral TID Haywood Pao, MD   75 mg at 03/17/13 2213  . sodium chloride 0.9 % injection 3 mL  3 mL Intravenous Q12H Fransico Him  Tisovec, MD   3 mL at 03/17/13 2200  . Vitamin D (Ergocalciferol) (DRISDOL) capsule 50,000 Units  50,000 Units Oral Q7 days Haywood Pao, MD      . zolpidem [facility]) tablet 5 mg  5 mg Oral QHS PRN Haywood Pao, MD   5 mg at 03/17/13 2213    PE: General appearance: alert, cooperative and no distress Lungs: clear to auscultation bilaterally Heart: regular rate and rhythm Extremities: no LEE Pulses: 2+ and symmetric Skin: Warm and dry Neurologic: Grossly normal  Lab Results:   Recent Labs  03/17/13 0023 03/17/13 0800 03/18/13 0540  WBC 9.8 8.6 9.3  HGB 12.1* 11.8* 11.7*  HCT 35.6* 34.0* 33.3*  PLT 224 207 206   BMET  Recent Labs  03/17/13 0023 03/17/13 0800 03/18/13 0540  NA 138 140 142  K 4.0 4.5 4.8  CL 102 105 108  CO2 25 26 24   GLUCOSE 145* 113* 69*  BUN 31* 30* 31*  CREATININE 2.22* 2.13* 2.06*  CALCIUM 9.6 9.7 9.4  Cholesterol  Recent Labs  03/17/13 0800  CHOL 96   Cardiac Enzymes Cardiac Panel (last 3 results)  Recent Labs  03/17/13 0800  03/17/13 1139 03/17/13 1803  TROPONINI <0.30 <0.30 <0.30    Assessment/Plan  Principal Problem:   Chest pain Active Problems:   Coronary artery disease, CABG 1991 and re-do in 2002   Diabetes   CKD (chronic kidney disease) stage 4, GFR 15-29 ml/min   Hyperlipidemia LDL goal < 100   Vitamin B 12 deficiency  Plan: No further CP. Cardiac enzymes negative x 3. Plan for NST today. He has been NPO since midnight. BP and HR both stable. Will have radiologist and cardiologist interpret results. If abnormal will need diagnostic LHC with possible PCI. Keep hydrating with IVFs. Renal function is slowly improving with SCr. of 2.06 (down from 2.22 and 2.13). Will continue to monitor.    LOS: 1 day    Brittainy M. Ladoris Gene 03/18/2013 8:18 AM    Patient seen and examined. Agree with assessment and plan. Had recurrent chest pain last night improved with morphine. Pain free presently. Renal function improving with Cr today 2.06. For Nuclear perfusion study today.   Troy Sine, MD, [facility] 03/18/2013 9:01 AM

## 2013-03-19 NOTE — Care Management Note (Signed)
    Page 1 of 1   03/19/2013     12:04:46 PM   CARE MANAGEMENT NOTE 03/19/2013  Patient:  Allen Medina, [place]   Account Number:  [id number]  Date Initiated:  03/18/2013  Documentation initiated by:  Toy Samarin  Subjective/Objective Assessment:   PT ADM WITH CHEST PAIN; PTA, PT INDEPENDENT, LIVES WITH WIFE.     Action/Plan:   STRESS TEST PENDING.  LIKELY DC LATER TODAY IF ST NEGATIVE.   Anticipated DC Date:  03/18/2013   Anticipated DC Plan:  [place]  CM consult      Choice offered to / List presented to:             Status of service:  Completed, signed off Medicare Important Message given?   (If response is "NO", the following Medicare IM given date fields will be blank) Date Medicare IM given:   Date Additional Medicare IM given:    Discharge Disposition:  HOME/SELF CARE  Per UR Regulation:  Reviewed for med. necessity/level of care/duration of stay  If discussed at [place] of Stay Meetings, dates discussed:    Comments:

## 2013-04-25 ENCOUNTER — Telehealth: Payer: Self-pay | Admitting: Neurology

## 2013-04-25 ENCOUNTER — Other Ambulatory Visit: Payer: Self-pay | Admitting: Neurology

## 2013-04-25 DIAGNOSIS — R351 Nocturia: Secondary | ICD-10-CM

## 2013-04-25 DIAGNOSIS — G4710 Hypersomnia, unspecified: Secondary | ICD-10-CM

## 2013-04-25 DIAGNOSIS — G4701 Insomnia due to medical condition: Secondary | ICD-10-CM

## 2013-04-25 NOTE — Telephone Encounter (Signed)
Dr.W.D. Brigitte Pulse is referring this patient for evaluation of sleep apnea.  Ht. 66.25 in. Wt. 213 BMI 34.12  Obesity Hypertension Hyperlipidemia CAD Insomnia  Medication List Lorazepam 0.5MG  Accu-Check Compact Strp Cyanocobalamin 1000 mcg Tylenol EX STArthritis Pain 500 MG Cialis 5 MG Amlodipine Besylate 10 MG Lyrica 75 MG Micardis HCT 80-25 MG Glipizide 10 MG Ambien CR 12.5 MG Januvia 50 MG Lantus Solostar 100 Unit BD Pen Needle Nano U/F 32 GX 4 MM Misc Crestor 20 MG Tabs Vitamin D 1000 Unit Tabs Proair HFA 108 (90 Base) MCG/ACT AERS   Dr. Joycelyn Man would like patient evaluated for sleepiness and insomnia.  He has sleep disruption and sleepiness during the day.  When he wakes up to use the restroom he cant go back to sleep.  He is dependent on Ambien for sleep.

## 2013-04-25 NOTE — Telephone Encounter (Signed)
This patient qualifies for attended sleep study based on clinical history. Nocturia, BMI elevated,  Diabetes , insomnia and snoring,  Get EDS. [place] , please.   Patient will need a  SPLIT at AHI 10 and   3% scoring.  ask him to bring his own Lorrin Mais / lorazepam to the sleep lab with him, please.   no CO2 necessary.  CD

## 2013-05-09 ENCOUNTER — Ambulatory Visit (INDEPENDENT_AMBULATORY_CARE_PROVIDER_SITE_OTHER): Payer: Medicare Other

## 2013-05-09 DIAGNOSIS — G4733 Obstructive sleep apnea (adult) (pediatric): Secondary | ICD-10-CM

## 2013-05-09 DIAGNOSIS — R351 Nocturia: Secondary | ICD-10-CM

## 2013-05-09 DIAGNOSIS — G4710 Hypersomnia, unspecified: Secondary | ICD-10-CM

## 2013-05-09 DIAGNOSIS — G4701 Insomnia due to medical condition: Secondary | ICD-10-CM

## 2013-05-16 ENCOUNTER — Encounter: Payer: Self-pay | Admitting: *Deleted

## 2013-05-16 ENCOUNTER — Telehealth: Payer: Self-pay | Admitting: *Deleted

## 2013-05-16 NOTE — Telephone Encounter (Signed)
Called patient to discuss sleep study results from  05/09/2013.  Discussed findings, recommendations and follow up care.  Patient understood well and all questions were answered.  He chooses to follow up with Dr. Brigitte Pulse to discuss treatment options at this time.  I explained he could have a follow up with Dr. Brett Fairy at any time if he chooses.  Mailed copy of report to his home at his request. -sh

## 2013-06-26 ENCOUNTER — Encounter: Payer: Self-pay | Admitting: Cardiology

## 2013-06-26 ENCOUNTER — Ambulatory Visit (INDEPENDENT_AMBULATORY_CARE_PROVIDER_SITE_OTHER): Payer: Medicare Other | Admitting: Cardiology

## 2013-06-26 VITALS — BP 120/60 | HR 62 | Ht 67.0 in | Wt 211.5 lb

## 2013-06-26 DIAGNOSIS — E785 Hyperlipidemia, unspecified: Secondary | ICD-10-CM

## 2013-06-26 DIAGNOSIS — R011 Cardiac murmur, unspecified: Secondary | ICD-10-CM

## 2013-06-26 DIAGNOSIS — I10 Essential (primary) hypertension: Secondary | ICD-10-CM

## 2013-06-26 DIAGNOSIS — I25810 Atherosclerosis of coronary artery bypass graft(s) without angina pectoris: Secondary | ICD-10-CM

## 2013-06-26 DIAGNOSIS — E669 Obesity, unspecified: Secondary | ICD-10-CM

## 2013-06-26 DIAGNOSIS — I2510 Atherosclerotic heart disease of native coronary artery without angina pectoris: Secondary | ICD-10-CM

## 2013-06-26 DIAGNOSIS — I739 Peripheral vascular disease, unspecified: Secondary | ICD-10-CM

## 2013-06-26 NOTE — Patient Instructions (Addendum)
You seem to be doing quite well.  Good job with keeping up with your exercise & 3lb weight loss!!  I heard a bit of a murmur on your exam, & want to investigate that a bit with a heart ultrasound called an Echocardiogram.  Your physician has requested that you have an echocardiogram. Echocardiography is a painless test that uses sound waves to create images of your heart. It provides your doctor with information about the size and shape of your heart and how well your heart's chambers and valves are working. This procedure takes approximately one hour. There are no restrictions for this procedure.  Otherwise I would like for you to see one of my PAs or NP in ~6 months, then follow up with me in a year.  Dr.  Brigitte Pulse will be following up with your cholesterol levels.  Leonie Man, MD

## 2013-06-30 ENCOUNTER — Encounter: Payer: Self-pay | Admitting: Cardiology

## 2013-06-30 DIAGNOSIS — I739 Peripheral vascular disease, unspecified: Secondary | ICD-10-CM | POA: Insufficient documentation

## 2013-06-30 DIAGNOSIS — E669 Obesity, unspecified: Secondary | ICD-10-CM | POA: Insufficient documentation

## 2013-06-30 DIAGNOSIS — I10 Essential (primary) hypertension: Secondary | ICD-10-CM | POA: Insufficient documentation

## 2013-06-30 DIAGNOSIS — I2510 Atherosclerotic heart disease of native coronary artery without angina pectoris: Secondary | ICD-10-CM | POA: Insufficient documentation

## 2013-06-30 NOTE — Assessment & Plan Note (Addendum)
Unclear if this is aortic sclerosis versus stenosis, I don't recall hearing a murmur before, and therefore is relatively soft. Has not had a recent echocardiogram. We'll plan to order one prior to follow-up visit.

## 2013-06-30 NOTE — Assessment & Plan Note (Signed)
He seems to be doing relatively well with no active symptoms of angina. We'll continue to monitor for symptoms. Apparently in the past he false-negative stress test. I am inclined to believe that this current stress test with accurate basal affect his symptoms have somewhat dissipated. I don't have him on beta blockers a history of bradycardia and fatigue. He is on calcium channel blocker which should cover this edema. Is on ARB and low-dose HCTZ. Also on statin. He does take aspirin but is not listed as a medication.

## 2013-06-30 NOTE — Assessment & Plan Note (Signed)
Currently pretty well controlled and not anything changes. His edema is much better with a half dose of amlodipine.

## 2013-06-30 NOTE — Assessment & Plan Note (Signed)
Relatively well controlled on statin by previous evaluation.  Monitored by his primary physician.

## 2013-06-30 NOTE — Assessment & Plan Note (Signed)
I don't think his symptoms are claudication. We'll continue to monitor. If he has symptoms are truly claudication, we can reassess with ABIs and Dopplers. He is already on a pretty good regimen. Is also taking Lyrica which should help some of the peripheral symptoms. 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

## 2013-06-30 NOTE — Assessment & Plan Note (Signed)
Do note will options as far as native vessels go. He is has all ready had a redo CABG. Continue aggressive risk factor modification with statin and blood pressure control.

## 2013-06-30 NOTE — Progress Notes (Signed)
Patient ID: Allen Medina, male   DOB: [birth year], 72 y.o.   MRN: [id number]  Clinic Note: HPI: Allen Medina is a 72 y.o. male with a PMH below who presents today for shortness of breath and leg swelling.  I last saw him in during the hospital visit in March 2014 with chest pain. We discussed options for evaluation and need stated he would prefer doing a nuclear stress test. That failed to show any evidence of micro-ischemia with normal ejection fraction. He subsequently was discharged I saw him back in clinic in April, and he had no further chest pain. He noted leg swelling with peripheral neuropathy symptoms. My recommendations are to continue to stay active. We discussed obtain lower extremity venous Dopplers to look for venous insufficiency, but I decided to simply cut his amlodipine dose in half to see if that helped.as best I can tell, he never did have his venous Dopplers performed.  Interval History:  He comes in today feeling quite well. Denies any more of the chest discomfort symptoms. He continued to be relatively active, walking at least every other day for about a mile. 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 He has not had any exertional chest pain with rest or with this exertion. He denies any resting shortness of breath but does note that he pushes it a little short of breath. He gets occasionally dizzy when sitting up fast but denies any significant syncope or near syncope type symptoms. He doesn't have a significant orthopnea symptoms or PND but he that he does sleep in a chair simply due to discomfort with his back pain. He notes that his edema is much improved after backing off on his amlodipine. He denies any melena, hematochezia or hematuria. No TIA or amaurosis fugax symptoms. He does note tingling in both his legs but denies any to claudicationtype symptoms.   Past Medical History  Diagnosis Date  . S/P CABG x 5 1991    only LIMA-LAD still patent    . S/P CABG x 4 2000    SVG-OM1, SVG-OM 2-OM 3, SVG-diagonal, free RIMA-RPDA ( has stent in the vessel from 2003)-  . CAD, multiple vessel 1991    last cath 2006-patent grafts, last nuc 09/10/09-no ischemia  . CAD (coronary artery disease) of bypass graft 2000    Occluded grafts --> redo CABG  . Diabetes mellitus   . Peripheral vascular disease     peripheral neuropathy, claudication  . Myocardial infarction 2000    LOV with EKG Dr Rex Kras 7/12 on chart, eccho 10/03, stress test 9/10 on chart, chest x ray 2/13 EP[facility], chest CT 02/13/12 on chart  . CKD (chronic kidney disease) stage 4, GFR 15-29 ml/min     kidney stones,cmet 02/09/12, 02/13/12 on chart  . Claudication     LE dopplers 03/17/10, normal ABIs, normal pressures  . HTN (hypertension)     difficult to control  . Diabetes mellitus type 2 with complications     renal insuff, HTN, CAD, PVD  . Hyperlipidemia LDL goal < 100   . Spinal stenosis   . Vitamin B 12 deficiency   . Neuropathy in diabetes   . History of blood transfusion 2010    Prior Cardiac Evaluation and Past Surgical History: Past Surgical History  Procedure Laterality Date  . Coronary artery bypass graft  1991    only LIMA-LAD patent  . Redo - coronary artery bypass graft  2000  previous LIMA to LAD patent; seqSVG - OM 2, OM 3, SVG-D3, SVG-OM1, fRIMA-rPDA  . 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 Cystoscopy/retrograde/ureteroscopy/stone extraction with basket  02/16/2012    Procedure: CYSTOSCOPY/RETROGRADE/URETEROSCOPY/STONE EXTRACTION WITH BASKET;  Surgeon: Malka So, MD;  Location: [facility];  Service: Urology;  Laterality: Left;  Left Ureterscopy with Stone Extraction  . Cardiac catheterization  06/04/1990    severe 3 vessel dz  . Cardiac catheterization  01/13/97    patent grafts, nl LV function  . Cardiac catheterization  01/28/1999    severe native CAD with disease involving all 3 SVG, recommend redo CABG  . Cardiac catheterization  05/16/2002     patent graft except of mod to sever stenosis in the midprotion of the RIMA to the posterior descending artery  . Coronary angioplasty with stent placement  05/22/2002    3.0x15mm cypher stent covered the proximal and distal portion of RIMA, final inflation -16x60  . Cardiac catheterization  08/28/2003    nl lv fxn, patent grafts to all vessels with small vessel disease  . Cardiac catheterization  07/07/2005    patent grafts, EF 45-50%  . Doppler echocardiography  10/14/2002    EF 50-55%, no valve issues.  . 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 Back surgery  2009, 2010  . Nm myoview ltd  02/2013    no evidence of ischemia or infarct, septal hypokinesis/dyskinesis     Allergies  Allergen Reactions  . Food     Pt states he is not allergic to gluten   . No Known Drug Allergy     Current Outpatient Prescriptions  Medication Sig Dispense Refill  . amLODipine (NORVASC) 10 MG tablet Take 5 mg by mouth daily.       . cyanocobalamin (,VITAMIN B-12,) 1000 MCG/ML injection Inject 1,000 mcg into the muscle every 30 (thirty) days. Next due 02/18/12      . glipiZIDE (GLUCOTROL) 10 MG tablet Take 10 mg by mouth 2 (two) times daily before a meal. STATES IS Taking 5 mg every morning since 02/06/12      . insulin glargine (LANTUS) 100 UNIT/ML injection Inject 16 Units into the skin daily.      Marland Kitchen LORazepam (ATIVAN) 0.5 MG tablet Take 0.5 mg by mouth daily.      . pregabalin (LYRICA) 75 MG capsule Take 75 mg by mouth 3 (three) times daily.      . rosuvastatin (CRESTOR) 20 MG tablet Take 20 mg by mouth daily.      . sitaGLIPtin (JANUVIA) 50 MG tablet Take 50 mg by mouth daily.      . tadalafil (CIALIS) 5 MG tablet Take 5 mg by mouth daily as needed for erectile dysfunction.      Marland Kitchen telmisartan-hydrochlorothiazide (MICARDIS HCT) 80-25 MG per tablet Take 1 tablet by mouth daily.      Marland Kitchen zolpidem (AMBIEN CR) 12.5 MG CR tablet Take 12.5 mg by mouth at bedtime.       No current facility-administered  medications for this visit.    History   Social History  . Marital Status: Married    Spouse Name: N/A    Number of Children: 3  . Years of Education: N/A   Occupational History  . Not on file.   Social History Main Topics  . Smoking status: Never Smoker   . Smokeless tobacco: Never Used  . Alcohol Use: No  .  Drug Use: No  . Sexually Active: Not on file   Other Topics Concern  . Not on file   Social History Narrative  . No narrative on file    ROS: A comprehensive Review of Systems - Negative except pertinent systems as above. No tissue area. A change in bowel habits. No rashes or lesions. He does that he does have some mild bruising this chronic he does note occasional wheezing that he thinks may be due to allergies. Otherwise negative.  PHYSICAL EXAM BP 120/60  Pulse 62  Ht 5\' 7"  (1.702 m)  Wt 211 lb 8 oz (95.936 kg)  BMI 33.12 kg/m2 General appearance: alert, cooperative, appears stated age, no distress, mildly obese and mostly truncal. Pleasant mood and affect. Well-nourished and well-groomed. Neck: no carotid bruit, no JVD and thyroid not enlarged, symmetric, no tenderness/mass/nodules Lungs: clear to auscultation bilaterally, normal percussion bilaterally and mild forced x-ray wheezing. Otherwise clear, nonlaboredand with good air movement. Heart: regular rate and rhythm, S1, S2 normal, soft 1-2/6 SEM heard at the RUSB, click, rub or gallop and normal apical impulse Abdomen: soft, non-tender; bowel sounds normal; no masses,  no organomegaly and mild obesity Extremities: extremities normal, atraumatic, no cyanosis or edema, no edema, redness or tenderness in the calves or thighs and no ulcers, gangrene or trophic changes Pulses: mildly diminished pulses bilaterally feet (DP and PT). Neurologic: Grossly normal  [id number] today: Yes Rate:62 , Rhythm: normal sinus, nonspecific ST-T changes. Mild T wave inversions in 1 and aVL;    ASSESSMENT / PLAN: Relatively  stable from a cardiac standpoint.  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 Unclear if this is aortic sclerosis versus stenosis, I don't recall hearing a murmur before, and therefore is relatively soft. Has not had a recent echocardiogram. We'll plan to order one prior to follow-up visit.  CAD (coronary artery disease) of artery bypass graft - only LIMA-LAD patent from CABG in 66; rredo CABG 2000 He seems to be doing relatively well with no active symptoms of angina. We'll continue to monitor for symptoms. Apparently in the past he false-negative stress test. I am inclined to believe that this current stress test with accurate basal affect his symptoms have somewhat dissipated. I don't have him on beta blockers a history of bradycardia and fatigue. He is on calcium channel blocker which should cover this edema. Is on ARB and low-dose HCTZ. Also on statin. He does take aspirin but is not listed as a medication.  Hyperlipidemia LDL goal < 100 Relatively well controlled on statin by previous evaluation.  Monitored by his primary physician.  CAD, multiple vessel; native LAD and circumflex 100% occluded; distal RCA severely diseased with possible PL or RPDA occluded Do note will options as far as native vessels go. He is has all ready had a redo CABG. Continue aggressive risk factor modification with statin and blood pressure control.  Peripheral vascular disease I don't think his symptoms are claudication. We'll continue to monitor. If he has symptoms are truly claudication, we can reassess with ABIs and Dopplers. He is already on a pretty good regimen. Is also taking Lyrica which should help some of the peripheral symptoms. 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  HTN (hypertension) Currently pretty well controlled and not anything changes. His edema is much better with a half dose of amlodipine.    Per problem list. Orders Placed This Encounter  Procedures  . EKG  12-Lead  . 2D Echocardiogram without contrast  Standing Status: Future     Number of Occurrences:      Standing Expiration Date: 06/26/2014    Order Specific Question:  Type of Echo    Answer:  Complete    Order Specific Question:  Where should this test be performed    Answer:  [facility]    Order Specific Question:  Reason for exam-Echo    Answer:  Murmur  785.2   No orders of the defined types were placed in this encounter.    Followup: 6 months with PA, one year with me  DAVID W. Ellyn Hack, M.D., M.S. [facility] [address] [place], [place]  [address]  [phone] Pager # [phone]

## 2013-07-09 ENCOUNTER — Ambulatory Visit (HOSPITAL_COMMUNITY)
Admission: RE | Admit: 2013-07-09 | Discharge: 2013-07-09 | Disposition: A | Discharge status: Home / Self Care | Payer: Medicare Other | Source: Ambulatory Visit | Attending: Cardiology | Admitting: Cardiology

## 2013-07-09 DIAGNOSIS — I10 Essential (primary) hypertension: Secondary | ICD-10-CM | POA: Insufficient documentation

## 2013-07-09 DIAGNOSIS — E785 Hyperlipidemia, unspecified: Secondary | ICD-10-CM | POA: Insufficient documentation

## 2013-07-09 DIAGNOSIS — R011 Cardiac murmur, unspecified: Secondary | ICD-10-CM | POA: Insufficient documentation

## 2013-07-09 DIAGNOSIS — I739 Peripheral vascular disease, unspecified: Secondary | ICD-10-CM | POA: Insufficient documentation

## 2013-07-09 DIAGNOSIS — I2510 Atherosclerotic heart disease of native coronary artery without angina pectoris: Secondary | ICD-10-CM | POA: Insufficient documentation

## 2013-07-09 HISTORY — PX: DOPPLER ECHOCARDIOGRAPHY: SHX263

## 2013-07-09 NOTE — Progress Notes (Signed)
2D Echo Performed 07/09/2013    Fields Oros, RCS  

## 2013-07-12 ENCOUNTER — Telehealth: Payer: Self-pay | Admitting: *Deleted

## 2013-07-12 NOTE — Telephone Encounter (Signed)
Message copied by Raiford Simmonds on Fri Jul 12, 2013  8:28 AM ------      Message from: [facility], DAVID      Created: Wed Jul 10, 2013 10:06 PM       Pretty normal Echo -- murmur is probably an "innocent flow murmur across the aortic valve" -- nothing to be concerned about.        Otherwise - normal function and valves.            Leonie Man, MD       ------

## 2013-07-12 NOTE — Telephone Encounter (Signed)
Left message with results on answer machine , any question may call back

## 2013-07-24 ENCOUNTER — Other Ambulatory Visit: Payer: Self-pay

## 2013-10-24 ENCOUNTER — Other Ambulatory Visit: Payer: Self-pay

## 2014-03-12 ENCOUNTER — Ambulatory Visit (INDEPENDENT_AMBULATORY_CARE_PROVIDER_SITE_OTHER): Payer: Medicare Other | Admitting: Ophthalmology

## 2014-03-12 DIAGNOSIS — H35039 Hypertensive retinopathy, unspecified eye: Secondary | ICD-10-CM

## 2014-03-12 DIAGNOSIS — E11359 Type 2 diabetes mellitus with proliferative diabetic retinopathy without macular edema: Secondary | ICD-10-CM

## 2014-03-12 DIAGNOSIS — E1165 Type 2 diabetes mellitus with hyperglycemia: Secondary | ICD-10-CM

## 2014-03-12 DIAGNOSIS — I10 Essential (primary) hypertension: Secondary | ICD-10-CM

## 2014-03-12 DIAGNOSIS — E1139 Type 2 diabetes mellitus with other diabetic ophthalmic complication: Secondary | ICD-10-CM

## 2014-03-13 ENCOUNTER — Encounter: Payer: Self-pay | Admitting: Cardiology

## 2014-03-13 ENCOUNTER — Ambulatory Visit (INDEPENDENT_AMBULATORY_CARE_PROVIDER_SITE_OTHER): Payer: Medicare Other | Admitting: Cardiology

## 2014-03-13 VITALS — BP 140/68 | HR 56 | Ht 66.5 in | Wt 217.0 lb

## 2014-03-13 DIAGNOSIS — I25810 Atherosclerosis of coronary artery bypass graft(s) without angina pectoris: Secondary | ICD-10-CM

## 2014-03-13 DIAGNOSIS — I739 Peripheral vascular disease, unspecified: Secondary | ICD-10-CM

## 2014-03-13 DIAGNOSIS — E669 Obesity, unspecified: Secondary | ICD-10-CM

## 2014-03-13 DIAGNOSIS — E785 Hyperlipidemia, unspecified: Secondary | ICD-10-CM

## 2014-03-13 DIAGNOSIS — R011 Cardiac murmur, unspecified: Secondary | ICD-10-CM

## 2014-03-13 DIAGNOSIS — I2510 Atherosclerotic heart disease of native coronary artery without angina pectoris: Secondary | ICD-10-CM

## 2014-03-13 DIAGNOSIS — I10 Essential (primary) hypertension: Secondary | ICD-10-CM

## 2014-03-13 DIAGNOSIS — Z01810 Encounter for preprocedural cardiovascular examination: Secondary | ICD-10-CM

## 2014-03-13 NOTE — Patient Instructions (Signed)
You seem stable from a Heart Standpoint.  Pre-operatively, I would no do any additional evaluation in the absence of Symptoms.  With known Heart Artery disease, Diabetes on insulin & Kidney dysfunction does make you a HIGH RISK patient from a Cardiac Risk standpoint, but does not warrant any additional work-up.  Continue Coreg - this reduces the risk.  Good luck with surgery & hopefully this will get you bak exercising to loose the weight you have gained.  I will see you back in ~6 months & if stable then, can increase to ~12 months.  Leonie Man, MD  Your physician wants you to follow-up in: 6 months.  You will receive a reminder letter in the mail two months in advance. If you don't receive a letter, please call our office to schedule the follow-up appointment.

## 2014-03-15 ENCOUNTER — Encounter: Payer: Self-pay | Admitting: Cardiology

## 2014-03-15 DIAGNOSIS — Z01810 Encounter for preprocedural cardiovascular examination: Secondary | ICD-10-CM | POA: Insufficient documentation

## 2014-03-15 NOTE — Assessment & Plan Note (Signed)
Have been more difficult to control the past.. Better now. I would like to use a higher dose of carvedilol, but with a heart rate of 56 cannot increase. He did not tolerate amlodipine, due to edema.

## 2014-03-15 NOTE — Assessment & Plan Note (Signed)
Mr. Allen Medina has been relatively stable from a CAD standpoint. He had a Myoview stress test a year ago that was negative for ischemia. He has not had any active cardiac symptoms of heart failure or angina since. Up until his back is bothering him, he was quite active, at least doing 6-8 METS.  Fourthly, he has diabetes on insulin with a baseline creatinine greater than 2.   PREOPERATIVE CARDIAC RISK ASSESSMENT   Revised Cardiac Risk Index:  High Risk Surgery: no; -- Not intraperitoneal  Defined as Intraperitoneal, intrathoracic or suprainguinal vascular  Active CAD: no; in > 1 yr - has Multivessel CAD & redo CABG, but no active Sx.  CHF: no; Normal EF  Cerebrovascular Disease: no;   Diabetes: yes; On Insulin: yes  CKD (Cr >~ 2): yes;  Total: 2 (but with notable CAD history, ~2.5) Estimated Risk of Adverse Outcome: Intermediate to High  Estimated Risk of MI, PE, VF/VT (Cardiac Arrest), Complete Heart Block: 6.6-11 %;  Somewhat mitigated by Chronic BB use.   ACC/AHA Guidelines for "Clearance":  Step 1 - Need for Emergency Surgery: No:   Step 2 - Active Cardiac Conditions (Unstable Angina, Decompensated HF, Significant  Arrhytmias - Complete HB, Mobitz II, Symptomatic VT or SVT, Severe Aortic Stenosis - mean gradient > 40 mmHg, Valve area < 1.0 cm2):   No: No Active Sx.  If Yes - Evaluate & Treat per ACC/AHA Guidelines  Step 3 -  Low Risk Surgery: No: Intermediate Risk  If Yes --> proceed to OR  If No --> Step 4  Step 4 - Functional Capacity >= 4 METS without symptoms: Yes, Prior to onset of back pain.  If Yes --> proceed to OR  If No --> Step 5  Step 5 --  Clinical Risk Factors (CRF)   3 or more: Yes  If Yes -- assess Surgical Risk, --   (High Risk Non-cardiac), Intraabdominal or thoracic vascular surgery consider testing if it will change management.  Intermediate Risk: Proceed to OR with HR control, or consider testing if it will change management; in the absence  of ongoing symptoms, he would not be tested from a cardiac standpoint otherwise.  Therefore testing now and potentially leading to percutaneous intervention, would not be otherwise indicated, because he would not likely benefit from intervention symptomatically.  SUMMARY: Would recommend proceeding to the OR without any further testing. Continue perioperative beta blocker and statin. His noncardiac risk factors including CKD-4, diabetes on insulin are more prominent than his in active cardiac disease. As always, [facility] will be present if needed for consultation perioperatively.

## 2014-03-15 NOTE — Progress Notes (Signed)
Patient ID: Allen Medina, male   DOB: [birth year], 73 y.o.   MRN: [id number]  Clinic Note: HPI: Allen Medina is a 73 y.o. male with a PMH below who presents today routine follow-up - a bit early because he needs preop evaluation for back surgery (posterior / lateral approach @ L2-3 I L2-3 fusion).  I last saw him in July of 2014.  He is a former patient of Dr. Rex Kras, whom I met for the first time in in March 2014, when he was admitted to [facility] with chest pain. He was evaluated with a Myoview ST that did not show evidence of myocardial ischemia with normal ejection fraction.  He has had intermittent leg swelling with peripheral neuropathy symptoms.   Interval History:  He comes in today feeling quite well. Denies any more of the chest discomfort symptoms. Unfortunately he is not be as active as he has been in the past the disease had a bad back for the last few months this kept him he has gained back about 6 pounds he has lost. Up until his back pain started bothering him, he had been relatively active, walking at least every other day for about a mile.  He denies having any exertional chest pain or dyspnea with rest or with this exertion. She does get a bit short of breath if he pushes it a little more than usual. He gets occasionally dizzy when sitting up fast but denies any significant syncope or near syncope type symptoms. He doesn't have a significant orthopnea symptoms or PND but he that he does sleep in a chair simply due to discomfort with his back pain. He notes that his edema is much improved after backing off on his amlodipine. He denies any melena, hematochezia or hematuria. No TIA or amaurosis fugax symptoms. He does note tingling in both his legs but denies any to claudication type symptoms.  Past Medical History  Diagnosis Date  . S/P CABG x 5 1991    only LIMA-LAD still patent  . S/P CABG x 4 2000    SVG-OM1, SVG-OM 2-OM 3, SVG-diagonal, free RIMA-RPDA ( has  stent in the vessel from 2003)-  . CAD, multiple vessel 1991    last cath 2006-patent grafts, last nuc 09/10/09-no ischemia  . CAD (coronary artery disease) of bypass graft 2000    Occluded grafts --> redo CABG  . Diabetes mellitus   . Peripheral vascular disease     peripheral neuropathy, claudication  . Myocardial infarction 2000    LOV with EKG Dr Rex Kras 7/12 on chart, eccho 10/03, stress test 9/10 on chart, chest x ray 2/13 EPIC, chest CT 02/13/12 on chart  . CKD (chronic kidney disease) stage 4, GFR 15-29 ml/min     kidney stones,cmet 02/09/12, 02/13/12 on chart  . Claudication     LE dopplers 03/17/10, normal ABIs, normal pressures  . HTN (hypertension)     difficult to control  . Diabetes mellitus type 2 with complications     renal insuff, HTN, CAD, PVD  . Hyperlipidemia LDL goal < 100   . Spinal stenosis   . Vitamin B 12 deficiency   . Neuropathy in diabetes   . History of blood transfusion 2010    Prior Cardiac Evaluation and Past Surgical History: Past Surgical History  Procedure Laterality Date  . Coronary artery bypass graft  1991    only LIMA-LAD patent  . Redo - coronary artery bypass graft  2000  previous LIMA to LAD patent; seqSVG - OM 2, OM 3, SVG-D3, SVG-OM1, fRIMA-rPDA  . 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 Cardiac catheterization  06/04/1990    severe 3 vessel dz  . Cardiac catheterization  01/13/97    patent grafts, nl LV function  . Cardiac catheterization  01/28/1999    severe native CAD with disease involving all 3 SVG, recommend redo CABG  . Cardiac catheterization  05/16/2002    patent graft except of mod to sever stenosis in the midprotion of the RIMA to the posterior descending artery  . Coronary angioplasty with stent placement  05/22/2002    3.0x34m cypher stent covered the proximal and distal portion of RIMA, final inflation -16x60  . Cardiac catheterization  08/28/2003    nl lv fxn, patent grafts to all vessels with small vessel  disease  . Cardiac catheterization  07/07/2005    patent grafts, EF 45-50%  . Doppler echocardiography  10/14/2002    EF 50-55%, no valve issues.  . Nm myoview ltd  02/2013    no evidence of ischemia or infarct, septal hypokinesis/dyskinesis    Medications Allergies Reviewed on EPIC   reports that he has never smoked. He has never used smokeless tobacco. He reports that he does not drink alcohol or use illicit drugs. History   Social History Narrative   He is a married father of three, grandfather of nine, great-grandfather of four. He usually walks   daily about 20 minutes a day - but not recently due to back pain. Does not currently smoke and does not drink.     ROS: A comprehensive Review of Systems - Negative except pertinent systems as above. No tissue area. A change in bowel habits. No rashes or lesions. He does that he does have some mild bruising this chronic he does note occasional wheezing that he thinks may be due to allergies. Otherwise negative.  PHYSICAL EXAM BP 140/68  Pulse 56  Ht 5' 6.5" (1.689 m)  Wt 217 lb (98.431 kg)  BMI 34.50 kg/m2 General appearance: alert, cooperative, appears stated age, no distress, mildly obese and mostly truncal. Pleasant mood and affect. Well-nourished and well-groomed. Neck: no carotid bruit, no JVD and thyroid not enlarged, symmetric, no tenderness/mass/nodules Lungs: clear to auscultation bilaterally, normal percussion bilaterally and mild forced x-ray wheezing. Otherwise clear, nonlaboredand with good air movement. Heart: regular rate and rhythm, S1, S2 normal, soft 1-2/6 SEM heard at the RUSB, click, rub or gallop and normal apical impulse Abdomen: soft, non-tender; bowel sounds normal; no masses,  no organomegaly and mild obesity Extremities: extremities normal, atraumatic, no cyanosis or edema, no edema, redness or tenderness in the calves or thighs and no ulcers, gangrene or trophic changes Pulses: mildly diminished pulses  bilaterally feet (DP and PT). Neurologic: Grossly normal  EJKK:XFGHWEXHBtoday: Yes Rate:56 , Rhythm: Sinus Bradycardia; nonspecific ST-T changes. Mild T wave inversions in 1 and aVL -- No significan change.  Labs recently checked by his Nephrologist. Not available  ASSESSMENT / PLAN: Relatively stable from a cardiac standpoint.  Preoperative cardiovascular examination Mr. CEdelenhas been relatively stable from a CAD standpoint. He had a Myoview stress test a year ago that was negative for ischemia. He has not had any active cardiac symptoms of heart failure or angina since. Up until his back is bothering him, he was quite active, at least doing 6-8 METS.  Fourthly, he has diabetes on insulin with a baseline creatinine greater  than 2.   PREOPERATIVE CARDIAC RISK ASSESSMENT   Revised Cardiac Risk Index:  High Risk Surgery: no; -- Not intraperitoneal  Defined as Intraperitoneal, intrathoracic or suprainguinal vascular  Active CAD: no; in > 1 yr - has Multivessel CAD & redo CABG, but no active Sx.  CHF: no; Normal EF  Cerebrovascular Disease: no;   Diabetes: yes; On Insulin: yes  CKD (Cr >~ 2): yes;  Total: 2 (but with notable CAD history, ~2.5) Estimated Risk of Adverse Outcome: Intermediate to High  Estimated Risk of MI, PE, VF/VT (Cardiac Arrest), Complete Heart Block: 6.6-11 %;  Somewhat mitigated by Chronic BB use.   ACC/AHA Guidelines for "Clearance":  Step 1 - Need for Emergency Surgery: No:   Step 2 - Active Cardiac Conditions (Unstable Angina, Decompensated HF, Significant  Arrhytmias - Complete HB, Mobitz II, Symptomatic VT or SVT, Severe Aortic Stenosis - mean gradient > 40 mmHg, Valve area < 1.0 cm2):   No: No Active Sx.  If Yes - Evaluate & Treat per ACC/AHA Guidelines  Step 3 -  Low Risk Surgery: No: Intermediate Risk  If Yes --> proceed to OR  If No --> Step 4  Step 4 - Functional Capacity >= 4 METS without symptoms: Yes, Prior to onset of back  pain.  If Yes --> proceed to OR  If No --> Step 5  Step 5 --  Clinical Risk Factors (CRF)   3 or more: Yes  If Yes -- assess Surgical Risk, --   (High Risk Non-cardiac), Intraabdominal or thoracic vascular surgery consider testing if it will change management.  Intermediate Risk: Proceed to OR with HR control, or consider testing if it will change management; in the absence of ongoing symptoms, he would not be tested from a cardiac standpoint otherwise.  Therefore testing now and potentially leading to percutaneous intervention, would not be otherwise indicated, because he would not likely benefit from intervention symptomatically.  SUMMARY: Would recommend proceeding to the OR without any further testing. Continue perioperative beta blocker and statin. His noncardiac risk factors including CKD-4, diabetes on insulin are more prominent than his in active cardiac disease. As always, [facility] will be present if needed for consultation perioperatively.   CAD (coronary artery disease) of artery bypass graft - only LIMA-LAD patent from CABG in 91; rredo CABG 2000 Relatively stable with no active angina. He is on a low dose beta blocker (unlikely to titrate due to history of bradycardia), statin as well as ARB. He does not have aspirin listed, but he is on aspirin.  Hyperlipidemia LDL goal < 100 On statin. Monitored by PCP/Nephrologist  HTN (hypertension) Have been more difficult to control the past.. Better now. I would like to use a higher dose of carvedilol, but with a heart rate of 56 cannot increase. He did not tolerate amlodipine, due to edema.  Peripheral vascular disease No active claudication symptoms.  Obesity (BMI 30.0-34.9)  Hopefully, once he is able to gt his back operated on, he'll be able to get back into exercising and lose the weight that he gained back.  He is trying to watch his diet, however.    Per problem list. Orders Placed This Encounter  Procedures  .  EKG 12-Lead   Followup: 6 months with me  DAVID W. Ellyn Hack, M.D., M.S. [facility] [address] [place], [place]  [address]  [phone] Pager # [phone]

## 2014-03-15 NOTE — Assessment & Plan Note (Signed)
Relatively stable with no active angina. He is on a low dose beta blocker (unlikely to titrate due to history of bradycardia), statin as well as ARB. He does not have aspirin listed, but he is on aspirin.

## 2014-03-15 NOTE — Assessment & Plan Note (Signed)
On statin. Monitored by PCP/Nephrologist

## 2014-03-15 NOTE — Assessment & Plan Note (Signed)
Hopefully, once he is able to gt his back operated on, he'll be able to get back into exercising and lose the weight that he gained back.  He is trying to watch his diet, however.

## 2014-03-15 NOTE — Assessment & Plan Note (Signed)
No active claudication symptoms. 

## 2014-04-02 ENCOUNTER — Encounter (HOSPITAL_COMMUNITY): Payer: Self-pay | Admitting: Pharmacy Technician

## 2014-04-07 NOTE — Pre-Procedure Instructions (Signed)
CURTIES SASSAMAN  04/07/2014   Your procedure is scheduled on:  Wednesday April 16, 2014 at 8:30 AM.  Report to [facility] Short Stay Entrance "A"  Admitting at 6:30 AM.  Call this number if you have problems the morning of surgery: [phone]   Remember:   Do not eat food or drink liquids after midnight.   Take these medicines the morning of surgery with A SIP OF WATER: Carvedilol (Coreg) and Pregabalin (Lyrica)    Do NOT take any diabetic medications the morning of your surgery   Do not wear jewelry.  Do not wear lotions, powders, or colognes. You may wear deodorant.  Men may shave face and neck.  Do not bring valuables to the hospital.  [facility] is not responsible for any belongings or valuables.               Contacts, dentures or bridgework may not be worn into surgery.  Leave suitcase in the car. After surgery it may be brought to your room.  For patients admitted to the hospital, discharge time is determined by your treatment team.               Patients discharged the day of surgery will not be allowed to drive home.  Name and phone number of your driver: Family/Friend  Special Instructions: Shower using CHG soap the night before and the morning of your surgery  Please read over the following fact sheets that you were given: Pain Booklet, Coughing and Deep Breathing, Blood Transfusion Information, MRSA Information and Surgical Site Infection Prevention

## 2014-04-08 ENCOUNTER — Ambulatory Visit (HOSPITAL_COMMUNITY)
Admission: RE | Admit: 2014-04-08 | Discharge: 2014-04-08 | Disposition: A | Discharge status: Home / Self Care | Payer: Medicare Other | Source: Ambulatory Visit | Attending: Orthopedic Surgery | Admitting: Orthopedic Surgery

## 2014-04-08 ENCOUNTER — Encounter (HOSPITAL_COMMUNITY): Payer: Self-pay

## 2014-04-08 ENCOUNTER — Encounter (HOSPITAL_COMMUNITY)
Admission: RE | Admit: 2014-04-08 | Discharge: 2014-04-08 | Disposition: A | Discharge status: Home / Self Care | Payer: Medicare Other | Source: Ambulatory Visit | Attending: Orthopedic Surgery | Admitting: Orthopedic Surgery

## 2014-04-08 DIAGNOSIS — Z01818 Encounter for other preprocedural examination: Secondary | ICD-10-CM | POA: Insufficient documentation

## 2014-04-08 DIAGNOSIS — Z01812 Encounter for preprocedural laboratory examination: Secondary | ICD-10-CM | POA: Insufficient documentation

## 2014-04-08 HISTORY — DX: Pneumonia, unspecified organism: J18.9

## 2014-04-08 HISTORY — DX: Unspecified osteoarthritis, unspecified site: M19.90

## 2014-04-08 HISTORY — DX: Changes in skin texture: R23.4

## 2014-04-08 LAB — BASIC METABOLIC PANEL
BUN: 28 mg/dL — ABNORMAL HIGH (ref 6–23)
CHLORIDE: 101 meq/L (ref 96–112)
CO2: 23 mEq/L (ref 19–32)
Calcium: 9.5 mg/dL (ref 8.4–10.5)
Creatinine, Ser: 1.94 mg/dL — ABNORMAL HIGH (ref 0.50–1.35)
GFR, EST AFRICAN AMERICAN: 38 mL/min — AB (ref >90)
GFR, EST NON AFRICAN AMERICAN: 33 mL/min — AB (ref >90)
Glucose, Bld: 194 mg/dL — ABNORMAL HIGH (ref 70–99)
POTASSIUM: 4.7 meq/L (ref 3.7–5.3)
SODIUM: 139 meq/L (ref 137–147)

## 2014-04-08 LAB — CBC
HCT: 37.5 % — ABNORMAL LOW (ref 39.0–52.0)
HEMOGLOBIN: 12.5 g/dL — AB (ref 13.0–17.0)
MCH: 31.3 pg (ref 26.0–34.0)
MCHC: 33.3 g/dL (ref 30.0–36.0)
MCV: 94 fL (ref 78.0–100.0)
Platelets: 209 10*3/uL (ref 150–400)
RBC: 3.99 MIL/uL — ABNORMAL LOW (ref 4.22–5.81)
RDW: 12.8 % (ref 11.5–15.5)
WBC: 8.4 10*3/uL (ref 4.0–10.5)

## 2014-04-08 LAB — TYPE AND SCREEN
ABO/RH(D): O POS
Antibody Screen: NEGATIVE

## 2014-04-08 LAB — SURGICAL PCR SCREEN
MRSA, PCR: NEGATIVE
STAPHYLOCOCCUS AUREUS: NEGATIVE

## 2014-04-08 NOTE — Progress Notes (Signed)
Patient has a extensive cardiac history and Cardiologist is Dr. Glenetta Hew. PCP is Dr. Marton Redwood and Nephrologist is Dr. Graylon Gunning. Patient denied having any acute cardiac or pulmonary issues.

## 2014-04-08 NOTE — Progress Notes (Signed)
Based on creatinine being 1.94 will request LOV from Dr. Elmarie Shiley and have Ebony Hail, [place] review chart.

## 2014-04-09 LAB — ABO/RH: ABO/RH(D): O POS

## 2014-04-09 NOTE — Progress Notes (Signed)
Anesthesia chart review: Patient is a 73 year old male scheduled for L2-4 lateral discectomy and posterior fusion on 04/16/14 by Dr. Rolena Infante.   History includes non-smoker, CAD/MI s/p CABG '91 (LIMA to LAD) and '00 (SVG to OM1, SVG to OM2 and OM3, SVG to DIAG, Free RIMA to RPDA) and stent to [place] to RPDA graft '03, DM2, PVD (claudication), HTN, HLD, arthritis, peripheral neuropathy, cholecystectomy, CKD stage III (Dr. Posey Pronto).  BMI is 34 consistent with obesity. PCP is Dr. Marton Redwood. Dr. Posey Pronto is aware of plans for back surgery and instructed him to hold Micardis/HCT for at least four days preoperatively and for at least 3-4 days postoperatively. Cardiologist is Dr. Glenetta Hew, last visit in 02/2012 for routine followup and preoperative assessment. He was ultimately cleared for surgery without additional cardiac testing.  Echo on 07/09/13 showed: - Left ventricle: The cavity size was normal. Wall thickness was normal. Systolic function was vigorous. The estimated ejection fraction was in the range of 65% to 70%. Wall motion was normal; there were no regional wall motion abnormalities. Left ventricular diastolic function parameters were normal. - Aortic valve: Poorly visualized. Transvalvular velocity was minimally increased - there is increased cardiac output. There was no stenosis. No regurgitation. - Mitral valve: Structurally normal valve. Trivial regurgitation. - Left atrium: LA Volume/ BSA = 20.3 ml/m2 The atrium was normal in size. - Tricuspid valve: Mild regurgitation. - Inferior vena cava: The vessel was normal in size; the respirophasic diameter changes were in the normal range (= 50%); findings are consistent with normal central venous pressure.  Nuclear stress test on 03/18/13 showed:  1. No reversible ischemia.  2. Septal hypokinesia.  3. Left ventricular ejection fraction equal 59%  Cardiac cath on 07/07/05 showed: Patent graft including patent stent in the free RIMA to PDA, diffuse  native disease. In comparing the native PDA from the RCA in the vessel that the free RIMA inserts into, which is supposed to be the PDA, Dr. Chase Picket was not convinced that these were the same vessels. He felt it was not inconceivablet that patient's symptoms of dyspnea could not be related to severe RCA native disease and decrease flow into the PDA. The right system, however, is diffusely diseased and the distal portion is too small for stenting.  EKG on 03/13/14 showed SB, non-specific ST/T wave abnormality.  CXR on 04/08/14 showed no acute cardiopulmonary process.  Preoperative labs noted. BUN/Cr 28/1.94 which appears within his baseline.  Glucose 194.  H/H 12.5/37.5.  T&S done.  If no acute changes then I anticipate that he can proceed as planned.  George Hugh [facility] Short Stay Center/Anesthesiology Phone [phone] 04/09/2014 1:11 PM

## 2014-04-15 MED ORDER — DEXAMETHASONE SODIUM PHOSPHATE 4 MG/ML IJ SOLN
4.0000 mg | Freq: Once | INTRAMUSCULAR | Status: AC
  Administered 2014-04-16: 4 mg via INTRAVENOUS
  Filled 2014-04-15: qty 1

## 2014-04-15 MED ORDER — ACETAMINOPHEN 10 MG/ML IV SOLN
1000.0000 mg | Freq: Four times a day (QID) | INTRAVENOUS | Status: DC
  Administered 2014-04-16: 1000 mg via INTRAVENOUS
  Filled 2014-04-15: qty 100

## 2014-04-15 MED ORDER — CEFAZOLIN SODIUM-DEXTROSE 2-3 GM-% IV SOLR
2.0000 g | INTRAVENOUS | Status: AC
  Administered 2014-04-16: 2 g via INTRAVENOUS
  Filled 2014-04-15: qty 50

## 2014-04-16 ENCOUNTER — Encounter (HOSPITAL_COMMUNITY): Payer: Self-pay | Admitting: *Deleted

## 2014-04-16 ENCOUNTER — Inpatient Hospital Stay (HOSPITAL_COMMUNITY)
Admission: RE | Admit: 2014-04-16 | Discharge: 2014-04-18 | DRG: 460 | Disposition: A | Discharge status: Home / Self Care | Payer: Medicare Other | Source: Ambulatory Visit | Attending: Orthopedic Surgery | Admitting: Orthopedic Surgery

## 2014-04-16 ENCOUNTER — Encounter (HOSPITAL_COMMUNITY): Payer: Medicare Other | Admitting: Vascular Surgery

## 2014-04-16 ENCOUNTER — Encounter (HOSPITAL_COMMUNITY)
Admission: RE | Disposition: A | Discharge status: Home / Self Care | Payer: Self-pay | Source: Ambulatory Visit | Attending: Orthopedic Surgery

## 2014-04-16 ENCOUNTER — Inpatient Hospital Stay (HOSPITAL_COMMUNITY): Payer: Medicare Other | Admitting: Anesthesiology

## 2014-04-16 ENCOUNTER — Inpatient Hospital Stay (HOSPITAL_COMMUNITY): Payer: Medicare Other

## 2014-04-16 DIAGNOSIS — M48061 Spinal stenosis, lumbar region without neurogenic claudication: Principal | ICD-10-CM | POA: Diagnosis present

## 2014-04-16 DIAGNOSIS — M4180 Other forms of scoliosis, site unspecified: Secondary | ICD-10-CM | POA: Diagnosis present

## 2014-04-16 DIAGNOSIS — R110 Nausea: Secondary | ICD-10-CM | POA: Diagnosis not present

## 2014-04-16 DIAGNOSIS — Z981 Arthrodesis status: Secondary | ICD-10-CM

## 2014-04-16 DIAGNOSIS — K5900 Constipation, unspecified: Secondary | ICD-10-CM | POA: Diagnosis not present

## 2014-04-16 DIAGNOSIS — R0609 Other forms of dyspnea: Secondary | ICD-10-CM | POA: Diagnosis present

## 2014-04-16 DIAGNOSIS — M549 Dorsalgia, unspecified: Secondary | ICD-10-CM | POA: Diagnosis present

## 2014-04-16 DIAGNOSIS — M4804 Spinal stenosis, thoracic region: Secondary | ICD-10-CM | POA: Diagnosis present

## 2014-04-16 DIAGNOSIS — R0989 Other specified symptoms and signs involving the circulatory and respiratory systems: Secondary | ICD-10-CM | POA: Diagnosis present

## 2014-04-16 HISTORY — DX: Blindness, one eye, unspecified eye: H54.40

## 2014-04-16 HISTORY — DX: Calculus of kidney: N20.0

## 2014-04-16 HISTORY — DX: Other chronic pain: G89.29

## 2014-04-16 HISTORY — PX: LATERAL FUSION LUMBAR SPINE: SUR631

## 2014-04-16 HISTORY — DX: Low back pain: M54.5

## 2014-04-16 HISTORY — DX: Low back pain, unspecified: M54.50

## 2014-04-16 HISTORY — PX: ANTERIOR LAT LUMBAR FUSION: SHX1168

## 2014-04-16 LAB — GLUCOSE, CAPILLARY
GLUCOSE-CAPILLARY: 248 mg/dL — AB (ref 70–99)
GLUCOSE-CAPILLARY: 234 mg/dL — AB (ref 70–99)
Glucose-Capillary: 169 mg/dL — ABNORMAL HIGH (ref 70–99)
Glucose-Capillary: 216 mg/dL — ABNORMAL HIGH (ref 70–99)

## 2014-04-16 SURGERY — ANTERIOR LATERAL LUMBAR FUSION 2 LEVELS
Anesthesia: General | Site: Back

## 2014-04-16 MED ORDER — PREGABALIN 75 MG PO CAPS
75.0000 mg | ORAL_CAPSULE | Freq: Three times a day (TID) | ORAL | Status: DC
Start: 2014-04-16 — End: 2014-04-18
  Administered 2014-04-16 – 2014-04-18 (×6): 75 mg via ORAL
  Filled 2014-04-16: qty 3
  Filled 2014-04-16 (×5): qty 1

## 2014-04-16 MED ORDER — FLEET ENEMA 7-19 GM/118ML RE ENEM: 1.0000 | ENEMA | Freq: Once | RECTAL | Status: AC | PRN

## 2014-04-16 MED ORDER — HYDROMORPHONE HCL PF 1 MG/ML IJ SOLN
0.2500 mg | INTRAMUSCULAR | Status: DC | PRN
  Administered 2014-04-16: 0.5 mg via INTRAVENOUS
  Administered 2014-04-16: 0.25 mg via INTRAVENOUS

## 2014-04-16 MED ORDER — MENTHOL 3 MG MT LOZG: 1.0000 | LOZENGE | OROMUCOSAL | Status: DC | PRN

## 2014-04-16 MED ORDER — BUPIVACAINE-EPINEPHRINE 0.25% -1:200000 IJ SOLN
INTRAMUSCULAR | Status: DC | PRN
  Administered 2014-04-16: 20 mL

## 2014-04-16 MED ORDER — INSULIN GLARGINE 100 UNIT/ML ~~LOC~~ SOLN
16.0000 [IU] | Freq: Every day | SUBCUTANEOUS | Status: DC
  Administered 2014-04-17 – 2014-04-18 (×2): 16 [IU] via SUBCUTANEOUS
  Filled 2014-04-16 (×2): qty 0.16

## 2014-04-16 MED ORDER — CARVEDILOL 3.125 MG PO TABS
3.1250 mg | ORAL_TABLET | Freq: Two times a day (BID) | ORAL | Status: DC
  Administered 2014-04-16 – 2014-04-18 (×4): 3.125 mg via ORAL
  Filled 2014-04-16 (×6): qty 1

## 2014-04-16 MED ORDER — SODIUM CHLORIDE 0.9 % IJ SOLN: 3.0000 mL | Freq: Two times a day (BID) | INTRAMUSCULAR | Status: DC

## 2014-04-16 MED ORDER — METHOCARBAMOL 500 MG PO TABS: 500.0000 mg | ORAL_TABLET | Freq: Four times a day (QID) | ORAL | Status: DC | PRN

## 2014-04-16 MED ORDER — MIDAZOLAM HCL 2 MG/2ML IJ SOLN
INTRAMUSCULAR | Status: AC
  Filled 2014-04-16: qty 2

## 2014-04-16 MED ORDER — OXYCODONE HCL 5 MG PO TABS
ORAL_TABLET | ORAL | Status: AC
  Filled 2014-04-16: qty 1

## 2014-04-16 MED ORDER — LIDOCAINE HCL (CARDIAC) 20 MG/ML IV SOLN
INTRAVENOUS | Status: DC | PRN
  Administered 2014-04-16: 80 mg via INTRAVENOUS

## 2014-04-16 MED ORDER — HYDROCHLOROTHIAZIDE 25 MG PO TABS
25.0000 mg | ORAL_TABLET | Freq: Every day | ORAL | Status: DC
  Administered 2014-04-17: 25 mg via ORAL
  Filled 2014-04-16 (×2): qty 1

## 2014-04-16 MED ORDER — OXYCODONE HCL 5 MG PO TABS
5.0000 mg | ORAL_TABLET | Freq: Once | ORAL | Status: AC | PRN
  Administered 2014-04-16: 5 mg via ORAL

## 2014-04-16 MED ORDER — ONDANSETRON HCL 4 MG/2ML IJ SOLN: 4.0000 mg | Freq: Four times a day (QID) | INTRAMUSCULAR | Status: DC | PRN

## 2014-04-16 MED ORDER — FENTANYL CITRATE 0.05 MG/ML IJ SOLN
INTRAMUSCULAR | Status: AC
  Filled 2014-04-16: qty 5

## 2014-04-16 MED ORDER — FENTANYL CITRATE 0.05 MG/ML IJ SOLN
INTRAMUSCULAR | Status: DC | PRN
  Administered 2014-04-16: 150 ug via INTRAVENOUS
  Administered 2014-04-16 (×4): 50 ug via INTRAVENOUS

## 2014-04-16 MED ORDER — INSULIN ASPART 100 UNIT/ML ~~LOC~~ SOLN
4.0000 [IU] | Freq: Every day | SUBCUTANEOUS | Status: DC
  Administered 2014-04-17 – 2014-04-18 (×3): 4 [IU] via SUBCUTANEOUS

## 2014-04-16 MED ORDER — EPHEDRINE SULFATE 50 MG/ML IJ SOLN
INTRAMUSCULAR | Status: AC
  Filled 2014-04-16: qty 1

## 2014-04-16 MED ORDER — LINAGLIPTIN 5 MG PO TABS
5.0000 mg | ORAL_TABLET | Freq: Every day | ORAL | Status: DC
  Administered 2014-04-16 – 2014-04-18 (×3): 5 mg via ORAL
  Filled 2014-04-16 (×3): qty 1

## 2014-04-16 MED ORDER — ONDANSETRON HCL 4 MG/2ML IJ SOLN
INTRAMUSCULAR | Status: AC
  Filled 2014-04-16: qty 2

## 2014-04-16 MED ORDER — INSULIN LISPRO 100 UNIT/ML (KWIKPEN): 4.0000 [IU] | PEN_INJECTOR | Freq: Every day | SUBCUTANEOUS | Status: DC

## 2014-04-16 MED ORDER — NALOXONE HCL 0.4 MG/ML IJ SOLN: 0.4000 mg | INTRAMUSCULAR | Status: DC | PRN

## 2014-04-16 MED ORDER — ACETAMINOPHEN 10 MG/ML IV SOLN
1000.0000 mg | Freq: Four times a day (QID) | INTRAVENOUS | Status: DC
  Administered 2014-04-16 – 2014-04-17 (×3): 1000 mg via INTRAVENOUS
  Filled 2014-04-16 (×4): qty 100

## 2014-04-16 MED ORDER — 0.9 % SODIUM CHLORIDE (POUR BTL) OPTIME
TOPICAL | Status: DC | PRN
  Administered 2014-04-16 (×2): 1000 mL

## 2014-04-16 MED ORDER — PHENYLEPHRINE 40 MCG/ML (10ML) SYRINGE FOR IV PUSH (FOR BLOOD PRESSURE SUPPORT)
PREFILLED_SYRINGE | INTRAVENOUS | Status: AC
  Filled 2014-04-16: qty 10

## 2014-04-16 MED ORDER — TELMISARTAN-HCTZ 80-25 MG PO TABS: 1.0000 | ORAL_TABLET | Freq: Every day | ORAL | Status: DC

## 2014-04-16 MED ORDER — DOCUSATE SODIUM 100 MG PO CAPS
100.0000 mg | ORAL_CAPSULE | Freq: Two times a day (BID) | ORAL | Status: DC
  Administered 2014-04-16 – 2014-04-18 (×4): 100 mg via ORAL
  Filled 2014-04-16 (×6): qty 1

## 2014-04-16 MED ORDER — PHENOL 1.4 % MT LIQD
1.0000 | OROMUCOSAL | Status: DC | PRN
  Administered 2014-04-17: 1 via OROMUCOSAL
  Filled 2014-04-16: qty 177

## 2014-04-16 MED ORDER — PHENYLEPHRINE HCL 10 MG/ML IJ SOLN
10.0000 mg | INTRAVENOUS | Status: DC | PRN
  Administered 2014-04-16: 40 ug/min via INTRAVENOUS

## 2014-04-16 MED ORDER — SODIUM CHLORIDE 0.9 % IJ SOLN: 9.0000 mL | INTRAMUSCULAR | Status: DC | PRN

## 2014-04-16 MED ORDER — METHOCARBAMOL 100 MG/ML IJ SOLN
500.0000 mg | Freq: Four times a day (QID) | INTRAVENOUS | Status: DC | PRN
  Filled 2014-04-16: qty 5

## 2014-04-16 MED ORDER — PROPOFOL 10 MG/ML IV BOLUS
INTRAVENOUS | Status: DC | PRN
  Administered 2014-04-16: 110 mg via INTRAVENOUS

## 2014-04-16 MED ORDER — THROMBIN 20000 UNITS EX SOLR
CUTANEOUS | Status: DC | PRN
  Administered 2014-04-16: 09:00:00 via TOPICAL

## 2014-04-16 MED ORDER — IRBESARTAN 300 MG PO TABS
300.0000 mg | ORAL_TABLET | Freq: Every day | ORAL | Status: DC
  Administered 2014-04-17: 300 mg via ORAL
  Filled 2014-04-16 (×2): qty 1

## 2014-04-16 MED ORDER — GLYCOPYRROLATE 0.2 MG/ML IJ SOLN
INTRAMUSCULAR | Status: DC | PRN
  Administered 2014-04-16: 0.2 mg via INTRAVENOUS

## 2014-04-16 MED ORDER — SUCCINYLCHOLINE CHLORIDE 20 MG/ML IJ SOLN
INTRAMUSCULAR | Status: DC | PRN
  Administered 2014-04-16: 100 mg via INTRAVENOUS

## 2014-04-16 MED ORDER — THROMBIN 20000 UNITS EX SOLR
CUTANEOUS | Status: AC
  Filled 2014-04-16: qty 20000

## 2014-04-16 MED ORDER — ONDANSETRON HCL 4 MG/2ML IJ SOLN
4.0000 mg | INTRAMUSCULAR | Status: DC | PRN
  Administered 2014-04-17 – 2014-04-18 (×3): 4 mg via INTRAVENOUS
  Filled 2014-04-16 (×3): qty 2

## 2014-04-16 MED ORDER — DIPHENHYDRAMINE HCL 50 MG/ML IJ SOLN: 12.5000 mg | Freq: Four times a day (QID) | INTRAMUSCULAR | Status: DC | PRN

## 2014-04-16 MED ORDER — PROPOFOL 10 MG/ML IV BOLUS
INTRAVENOUS | Status: AC
  Filled 2014-04-16: qty 20

## 2014-04-16 MED ORDER — SODIUM CHLORIDE 0.9 % IJ SOLN
INTRAMUSCULAR | Status: AC
  Filled 2014-04-16: qty 10

## 2014-04-16 MED ORDER — MORPHINE SULFATE (PF) 1 MG/ML IV SOLN
INTRAVENOUS | Status: DC
  Administered 2014-04-16: 13 mg via INTRAVENOUS
  Administered 2014-04-16: 4 mg via INTRAVENOUS
  Administered 2014-04-16: 6 mg via INTRAVENOUS
  Administered 2014-04-17: 16 mg via INTRAVENOUS
  Filled 2014-04-16: qty 25

## 2014-04-16 MED ORDER — LACTATED RINGERS IV SOLN
INTRAVENOUS | Status: DC
  Administered 2014-04-16: 17:00:00 via INTRAVENOUS

## 2014-04-16 MED ORDER — OXYCODONE HCL 5 MG/5ML PO SOLN: 5.0000 mg | Freq: Once | ORAL | Status: AC | PRN

## 2014-04-16 MED ORDER — LIDOCAINE HCL (CARDIAC) 20 MG/ML IV SOLN
INTRAVENOUS | Status: AC
  Filled 2014-04-16: qty 5

## 2014-04-16 MED ORDER — MIDAZOLAM HCL 5 MG/5ML IJ SOLN
INTRAMUSCULAR | Status: DC | PRN
  Administered 2014-04-16: 2 mg via INTRAVENOUS

## 2014-04-16 MED ORDER — HYDROMORPHONE HCL PF 1 MG/ML IJ SOLN
INTRAMUSCULAR | Status: AC
  Administered 2014-04-16: 0.5 mg
  Filled 2014-04-16: qty 2

## 2014-04-16 MED ORDER — OXYCODONE HCL 5 MG PO TABS
10.0000 mg | ORAL_TABLET | ORAL | Status: DC | PRN
  Administered 2014-04-17: 10 mg via ORAL
  Filled 2014-04-16 (×2): qty 2

## 2014-04-16 MED ORDER — DIPHENHYDRAMINE HCL 12.5 MG/5ML PO ELIX: 12.5000 mg | ORAL_SOLUTION | Freq: Four times a day (QID) | ORAL | Status: DC | PRN

## 2014-04-16 MED ORDER — MORPHINE SULFATE (PF) 1 MG/ML IV SOLN
INTRAVENOUS | Status: AC
  Administered 2014-04-16: 15:00:00
  Filled 2014-04-16: qty 25

## 2014-04-16 MED ORDER — SUCCINYLCHOLINE CHLORIDE 20 MG/ML IJ SOLN
INTRAMUSCULAR | Status: AC
  Filled 2014-04-16: qty 1

## 2014-04-16 MED ORDER — LACTATED RINGERS IV SOLN
INTRAVENOUS | Status: DC | PRN
  Administered 2014-04-16 (×3): via INTRAVENOUS

## 2014-04-16 MED ORDER — SODIUM CHLORIDE 0.9 % IV SOLN: 250.0000 mL | INTRAVENOUS | Status: DC

## 2014-04-16 MED ORDER — CEFAZOLIN SODIUM 1-5 GM-% IV SOLN
1.0000 g | Freq: Three times a day (TID) | INTRAVENOUS | Status: AC
  Administered 2014-04-16 – 2014-04-17 (×2): 1 g via INTRAVENOUS
  Filled 2014-04-16 (×2): qty 50

## 2014-04-16 MED ORDER — ONDANSETRON HCL 4 MG/2ML IJ SOLN
INTRAMUSCULAR | Status: DC | PRN
  Administered 2014-04-16: 4 mg via INTRAVENOUS

## 2014-04-16 MED ORDER — BUPIVACAINE-EPINEPHRINE (PF) 0.25% -1:200000 IJ SOLN
INTRAMUSCULAR | Status: AC
  Filled 2014-04-16: qty 30

## 2014-04-16 MED ORDER — INSULIN ASPART 100 UNIT/ML ~~LOC~~ SOLN
0.0000 [IU] | SUBCUTANEOUS | Status: DC
  Administered 2014-04-16 (×2): 5 [IU] via SUBCUTANEOUS
  Administered 2014-04-17 (×3): 3 [IU] via SUBCUTANEOUS
  Administered 2014-04-18 (×2): 5 [IU] via SUBCUTANEOUS

## 2014-04-16 MED ORDER — ROCURONIUM BROMIDE 50 MG/5ML IV SOLN
INTRAVENOUS | Status: AC
  Filled 2014-04-16: qty 1

## 2014-04-16 MED ORDER — SODIUM CHLORIDE 0.9 % IJ SOLN: 3.0000 mL | INTRAMUSCULAR | Status: DC | PRN

## 2014-04-16 SURGICAL SUPPLY — 95 items
ADH SKN CLS APL DERMABOND .7 (GAUZE/BANDAGES/DRESSINGS) ×1
APPLIER CLIP 11 MED OPEN (CLIP)
APR CLP MED 11 20 MLT OPN (CLIP)
ATTRACTOMAT 16X20 MAGNETIC DRP (DRAPES) ×2 IMPLANT
BLADE 10 SAFETY STRL DISP (BLADE) ×1 IMPLANT
BLADE SURG 10 STRL SS (BLADE) ×3 IMPLANT
BLADE SURG ROTATE 9660 (MISCELLANEOUS) IMPLANT
BONE MATRIX OSTEOCEL PRO MED (Bone Implant) ×2 IMPLANT
CAGE COROENT XL 14X18X55-10 (Cage) ×1 IMPLANT
CLIP APPLIE 11 MED OPEN (CLIP) IMPLANT
CLSR STERI-STRIP ANTIMIC 1/2X4 (GAUZE/BANDAGES/DRESSINGS) ×1 IMPLANT
CORDS BIPOLAR (ELECTRODE) ×3 IMPLANT
COVER SURGICAL LIGHT HANDLE (MISCELLANEOUS) ×3 IMPLANT
DERMABOND ADVANCED (GAUZE/BANDAGES/DRESSINGS) ×1
DERMABOND ADVANCED .7 DNX12 (GAUZE/BANDAGES/DRESSINGS) ×1 IMPLANT
DRAPE C-ARM 42X72 X-RAY (DRAPES) ×5 IMPLANT
DRAPE C-ARMOR (DRAPES) ×3 IMPLANT
DRAPE ORTHO SPLIT 77X108 STRL (DRAPES) ×2
DRAPE POUCH INSTRU U-SHP 10X18 (DRAPES) ×2 IMPLANT
DRAPE SURG 17X23 STRL (DRAPES) ×2 IMPLANT
DRAPE SURG ORHT 6 SPLT 77X108 (DRAPES) ×1 IMPLANT
DRAPE U-SHAPE 47X51 STRL (DRAPES) ×3 IMPLANT
DRSG MEPILEX BORDER 4X4 (GAUZE/BANDAGES/DRESSINGS) ×1 IMPLANT
DRSG MEPILEX BORDER 4X8 (GAUZE/BANDAGES/DRESSINGS) ×4 IMPLANT
DURAPREP 26ML APPLICATOR (WOUND CARE) ×2 IMPLANT
ELECT BLADE 4.0 EZ CLEAN MEGAD (MISCELLANEOUS) ×2
ELECT CAUTERY BLADE 6.4 (BLADE) ×2 IMPLANT
ELECT REM PT RETURN 9FT ADLT (ELECTROSURGICAL) ×2
ELECTRODE BLDE 4.0 EZ CLN MEGD (MISCELLANEOUS) ×1 IMPLANT
ELECTRODE REM PT RTRN 9FT ADLT (ELECTROSURGICAL) ×1 IMPLANT
GAUZE SPONGE 4X4 16PLY XRAY LF (GAUZE/BANDAGES/DRESSINGS) ×2 IMPLANT
GLOVE BIOGEL PI IND STRL 7.0 (GLOVE) IMPLANT
GLOVE BIOGEL PI IND STRL 8 (GLOVE) ×1 IMPLANT
GLOVE BIOGEL PI IND STRL 8.5 (GLOVE) ×1 IMPLANT
GLOVE BIOGEL PI INDICATOR 7.0 (GLOVE) ×2
GLOVE BIOGEL PI INDICATOR 8 (GLOVE) ×2
GLOVE BIOGEL PI INDICATOR 8.5 (GLOVE) ×2
GLOVE BIOGEL PI ORTHO PRO 7.5 (GLOVE) ×2
GLOVE ECLIPSE 8.5 STRL (GLOVE) ×4 IMPLANT
GLOVE ORTHO TXT STRL SZ7.5 (GLOVE) ×2 IMPLANT
GLOVE PI ORTHO PRO STRL 7.5 (GLOVE) IMPLANT
GLOVE SURG SS PI 6.5 STRL IVOR (GLOVE) ×3 IMPLANT
GOWN STRL REUS W/ TWL LRG LVL3 (GOWN DISPOSABLE) ×1 IMPLANT
GOWN STRL REUS W/TWL 2XL LVL3 (GOWN DISPOSABLE) ×4 IMPLANT
GOWN STRL REUS W/TWL LRG LVL3 (GOWN DISPOSABLE) ×8
GUIDEWIRE NITINOL BEVEL TIP (WIRE) ×6 IMPLANT
IMPL COROENT XL 12X18X55MM IMPLANT
IMPLANT COROENT XL 12X18X55MM ×2 IMPLANT
KIT BASIN OR (CUSTOM PROCEDURE TRAY) ×2 IMPLANT
KIT DILATOR XLIF 5 (KITS) IMPLANT
KIT MAXCESS (KITS) ×1 IMPLANT
KIT NDL NVM5 EMG ELECT (KITS) IMPLANT
KIT NEEDLE NVM5 EMG ELECT (KITS) ×1 IMPLANT
KIT NEEDLE NVM5 EMG ELECTRODE (KITS) ×1
KIT ROOM TURNOVER OR (KITS) ×2 IMPLANT
KIT XLIF (KITS) ×1
MATRIX HEMOSTAT SURGIFLO (HEMOSTASIS) ×2 IMPLANT
NDL I-PASS III (NEEDLE) IMPLANT
NDL SPNL 18GX3.5 QUINCKE PK (NEEDLE) ×1 IMPLANT
NEEDLE 22X1 1/2 (OR ONLY) (NEEDLE) ×1 IMPLANT
NEEDLE I-PASS III (NEEDLE) ×4 IMPLANT
NEEDLE SPNL 18GX3.5 QUINCKE PK (NEEDLE) ×2 IMPLANT
NS IRRIG 1000ML POUR BTL (IV SOLUTION) ×2 IMPLANT
PACK LAMINECTOMY ORTHO (CUSTOM PROCEDURE TRAY) ×2 IMPLANT
PACK UNIVERSAL I (CUSTOM PROCEDURE TRAY) ×2 IMPLANT
PAD ARMBOARD 7.5X6 YLW CONV (MISCELLANEOUS) ×4 IMPLANT
PRECEPT SHANK 6.5X45 (Neuro Prosthesis/Implant) ×2 IMPLANT
PRECEPT TULIPS (Neuro Prosthesis/Implant) ×2 IMPLANT
PUTTY DBX 2.5CC (Putty) ×2 IMPLANT
PUTTY DBX 2.5CC DEPUY (Putty) IMPLANT
ROD PRECEPT PRE-BENT 80MM (Rod) ×1 IMPLANT
ROD PRECEPT PREBENT LORD 85MM (Rod) ×1 IMPLANT
SCREW POLYAXIAL 6.5X45MM (Screw) ×4 IMPLANT
SCREW PRECEPT SET (Screw) ×6 IMPLANT
SPONGE INTESTINAL PEANUT (DISPOSABLE) ×4 IMPLANT
SPONGE LAP 4X18 X RAY DECT (DISPOSABLE) ×3 IMPLANT
SPONGE SURGIFOAM ABS GEL 100 (HEMOSTASIS) ×1 IMPLANT
STRIP CLOSURE SKIN 1/2X4 (GAUZE/BANDAGES/DRESSINGS) IMPLANT
SURGIFLO TRUKIT (HEMOSTASIS) IMPLANT
SUT MON AB 3-0 SH 27 (SUTURE) ×6
SUT MON AB 3-0 SH27 (SUTURE) ×1 IMPLANT
SUT PROLENE 5 0 C 1 24 (SUTURE) IMPLANT
SUT SILK 2 0 TIES 10X30 (SUTURE) ×2 IMPLANT
SUT SILK 3 0 TIES 10X30 (SUTURE) ×2 IMPLANT
SUT VIC AB 1 CT1 27 (SUTURE) ×4
SUT VIC AB 1 CT1 27XBRD ANBCTR (SUTURE) ×2 IMPLANT
SUT VIC AB 1 CTX 36 (SUTURE) ×4
SUT VIC AB 1 CTX36XBRD ANBCTR (SUTURE) ×2 IMPLANT
SUT VIC AB 2-0 CT1 18 (SUTURE) ×3 IMPLANT
SYR BULB IRRIGATION 50ML (SYRINGE) ×2 IMPLANT
TOWEL OR 17X24 6PK STRL BLUE (TOWEL DISPOSABLE) ×3 IMPLANT
TOWEL OR 17X26 10 PK STRL BLUE (TOWEL DISPOSABLE) ×2 IMPLANT
TRAY FOLEY CATH 14FRSI W/METER (CATHETERS) ×1 IMPLANT
TRAY FOLEY CATH 16FRSI W/METER (SET/KITS/TRAYS/PACK) ×1 IMPLANT
WATER STERILE IRR 1000ML POUR (IV SOLUTION) ×2 IMPLANT

## 2014-04-16 NOTE — Transfer of Care (Signed)
Immediate Anesthesia Transfer of Care Note  Patient: Allen Medina  Procedure(s) Performed: Procedure(s): Lateral  Lumbar Two-Three/Three-Four Interbody and Fusion with Lateral Pedicle Screw Fixation Lumbar Two-Four (N/A)  Patient Location: PACU  Anesthesia Type:General  Level of Consciousness: awake and patient cooperative  Airway & Oxygen Therapy: Patient Spontanous Breathing and Patient connected to nasal cannula oxygen  Post-op Assessment: Report given to PACU RN, Post -op Vital signs reviewed and stable and Patient moving all extremities X 4  Post vital signs: Reviewed and stable  Complications: No apparent anesthesia complications

## 2014-04-16 NOTE — Anesthesia Postprocedure Evaluation (Signed)
  Anesthesia Post-op Note  Patient: Allen Medina  Procedure(s) Performed: Procedure(s): Lateral  Lumbar Two-Three/Three-Four Interbody and Fusion with Lateral Pedicle Screw Fixation Lumbar Two-Four (N/A)  Patient Location: PACU  Anesthesia Type:General  Level of Consciousness: awake and alert   Airway and Oxygen Therapy: Patient Spontanous Breathing  Post-op Pain: mild  Post-op Assessment: Post-op Vital signs reviewed, Patient's Cardiovascular Status Stable, Respiratory Function Stable, Patent Airway, No signs of Nausea or vomiting and Pain level controlled  Post-op Vital Signs: Reviewed and stable  Last Vitals:  Filed Vitals:   04/16/14 1430  BP: 147/59  Pulse: 54  Temp:   Resp: 15    Complications: No apparent anesthesia complications

## 2014-04-16 NOTE — Brief Op Note (Signed)
04/16/2014  1:30 PM  PATIENT:  Venida Jarvis  73 y.o. 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  PROCEDURE:  Procedure(s): Lateral  Lumbar Two-Three/Three-Four Interbody and Fusion with Lateral Pedicle Screw Fixation Lumbar Two-Four (N/A)  SURGEON:  Surgeon(s) and Role:    * Melina Schools, MD - Primary  PHYSICIAN ASSISTANT:   ASSISTANTS: Benjiman Core    ANESTHESIA:   general  EBL:  Total I/O In: 2000 [I.V.:2000] Out: 600 [Urine:450; Blood:150]  BLOOD ADMINISTERED:none  DRAINS: None  LOCAL MEDICATIONS USED:  MARCAINE     SPECIMEN:  No Specimen  DISPOSITION OF SPECIMEN:  N/A  COUNTS:  YES  TOURNIQUET:  * No tourniquets in log *  DICTATION: .Other Dictation: Dictation Number [id number]  PLAN OF CARE: Admit to inpatient   PATIENT DISPOSITION:  PACU - hemodynamically stable.

## 2014-04-16 NOTE — H&P (Signed)
History of Present Illness  The patient is a 73 year old male who comes in today for a preoperative History and Physical. The patient is scheduled for a XLIF L2-4, POSTERIOR PEDICLE SCREW FIXATION L2-4 to be performed by Dr. Duane Lope D. Rolena Infante, MD at [facility] on 04/16/2014 . Please see the hospital record for complete dictated history and physical.  The patient is being followed for their back pain. Symptoms reported today include: pain, weakness (bilat extrem) and leg pain (left lower). The patient states that they are doing poorly. The following medication has been used for pain control: Tylenol. The patient reports their current pain level to be 5 / 10 (increases with any activity). The patient presents today following MRI (lumbar @ [place]).   Subjective Transcription  73 year old white male with history of L2 to L4 stenosis and back pain. Bilateral leg pain. He returns stating symptoms are unchanged from previous visit. He wants to proceed with L2 to L4 X-LIF. That is scheduled next week. We received preop clearances.   Allergies No Known Drug Allergies.    Social History Never consumed alcohol. 02/21/2014: Never consumed alcohol Marital status. married Tobacco use. Never smoker. 02/21/2014 Number of flights of stairs before winded. 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 live with spouse Exercise. Exercises daily; does running / walking    Medication History HumaLOG KwikPen (100UNIT/ML Soln Pen-inj, Subcutaneous) Active. LORazepam (0.5MG  Tablet, Oral) Active. Zolpidem Tartrate ER (12.5MG  Tablet ER, Oral) Active. Lyrica (75MG  Capsule, Oral) Active. Carvedilol (3.125MG  Tablet, Oral) Active. Cialis (5MG  Tablet, Oral) Active. Januvia (50MG  Tablet, Oral) Active. Medications Reconciled.    Vitals 04/08/2014 9:07 AM Weight: 220 lb Height: 66 in Body Surface Area: 2.16 m Body Mass Index: 35.51  kg/m Temp.: 97.6 FPulse: 69 (Regular) BP: 128/76 (Sitting, Left Arm, Standard)   Objective Transcription  On exam, very pleasant elderly male alert and oriented x3 and in no acute distress. Gait is normal. Head is normocephalic and atraumatic. PERRL. Lungs clear bilaterally. No wheezing noted. Hear regular rate and rhythm. No murmurs noted. Abdomen is round and non-distended. Normal bowel sounds x4, soft and non-tender. He is neurologically intact. Skin normal and dry. No increase in respiratory effort. The patient denies lightheadedness, dizziness, fever, chills, nausea, vomiting, and chest pain. No GI issues. He does admit to having exertional dyspnea.  X-RAYS:  Today in my office demonstrated degenerative scoliosis, apex is at L2/3 with a slight lateral listhesis of L3 with respect to 4. This has progressed since his 2011 x-rays. There is also a greater amount of collapse in the 2, 3 disc space and 3,4 disc space when compared to 2011.  Recent MRI done on 03/03/2014 demonstrates marked degenerative loss of disc space height with modic endplate changes at [id number]. The result is severe left lateral recess and foraminal stenosis causing mass affect on the exiting L2 and traversing L3 nerve root. He has previous bilateral laminotomies at L3/4 as well as a broad-based disc protrusion causing mass effect centrally and into the left lateral recess. At 4/5 he again, has the bilateral laminotomy site and what looks like a recurrent small disc going to the right, causing significant right foraminal stenosis. There is no left foraminal stenosis. L5/S1 shows some moderate facet arthrosis.   Assessment & Plan   At this point in time clinically, the majority of his pain is back, buttock, and left leg. I do think that based on the x-rays, he has a structural  abnormality (degenerative scoliosis) which is causing the increased back pain. The degenerative of the disc is also  contributing to this back pain. In order to address this symptom, I do think the best course of action is fusion surgery. He has had 2 previous decompressions and the biggest problem he is having is back pain, which I think is related to progression of the structural curve. To accomplish this, I will do a left-sided XLIF (lateral fusion) at 2,3 and 3,4. This will allow me to correct the deformity as well as indirectly decompress those 2 levels. I would then supplement this with a posterior pedicle screw construct to improve his fusion rate.    In addition, because this is a 2 level procedure, I would also use an external bone stimulator for approximately 9 months, to improve our chance of getting a solid fusion. We have gone over the risks which include infection, bleeding, nerve damage, death, stroke, paralysis, hardware failure, nonunion adjacent segment disease, need for further surgery. Loss of bowel and bladder control, ongoing or worse pain, damage to the lumbar plexus which can create temporary or permanent hip pain, hip weakness, and difficulty walking. Both he and his wife have been present for the dictation. They both expressed an understanding for the surgery and will plan on getting medical clearance from his primary care physician, his cardiologist, and his nephrologist. Once we have that, then we will move forward with the surgery.    Risks of surgery include, but are not limited to: Death, stroke, paralysis, nerve root damage/injury, bleeding, blood clots, loss of bowel/bladder control, sexual dysfunction, retrograde ejaculation, hardware failure, or malposition, spinal fluid leak, adjacent segment disease, non-union, need for further surgery, ongoing or worse pain, injury to bladder, bowel and abdominal contents, infection and recurrent disc herniation

## 2014-04-16 NOTE — Anesthesia Procedure Notes (Signed)
Procedure Name: Intubation Date/Time: 04/16/2014 8:33 AM Performed by: Carney Living Pre-anesthesia Checklist: Patient identified, Emergency Drugs available, Suction available, Patient being monitored and Timeout performed Patient Re-evaluated:Patient Re-evaluated prior to inductionOxygen Delivery Method: Circle system utilized Preoxygenation: Pre-oxygenation with 100% oxygen Intubation Type: IV induction Ventilation: Mask ventilation without difficulty Laryngoscope Size: Mac and 4 Grade View: Grade I Tube type: Oral Tube size: 7.5 mm Number of attempts: 1 Airway Equipment and Method: Stylet Placement Confirmation: ETT inserted through vocal cords under direct vision,  positive ETCO2 and breath sounds checked- equal and bilateral Secured at: 21 cm Tube secured with: Tape Dental Injury: Teeth and Oropharynx as per pre-operative assessment

## 2014-04-16 NOTE — Anesthesia Preprocedure Evaluation (Addendum)
Anesthesia Evaluation  Patient identified by MRN, date of birth, ID band Patient awake    Reviewed: Allergy & Precautions, H&P , NPO status , Patient's Chart, lab work & pertinent test results  Airway Mallampati: II TM Distance: >3 FB Neck ROM: full    Dental  (+) Teeth Intact, Dental Advisory Given   Pulmonary neg pulmonary ROS,          Cardiovascular hypertension, Pt. on medications and Pt. on home beta blockers + CAD, + Past MI, + CABG and + Peripheral Vascular Disease     Neuro/Psych    GI/Hepatic   Endo/Other  diabetes, Type 2obese  Renal/GU Renal InsufficiencyRenal disease     Musculoskeletal   Abdominal   Peds  Hematology   Anesthesia Other Findings   Reproductive/Obstetrics                          Anesthesia Physical Anesthesia Plan  ASA: III  Anesthesia Plan: General   Post-op Pain Management:    Induction: Intravenous  Airway Management Planned: Oral ETT  Additional Equipment:   Intra-op Plan:   Post-operative Plan: Extubation in OR  Informed Consent: I have reviewed the patients History and Physical, chart, labs and discussed the procedure including the risks, benefits and alternatives for the proposed anesthesia with the patient or authorized representative who has indicated his/her understanding and acceptance.   Dental advisory given  Plan Discussed with: CRNA, Anesthesiologist and Surgeon  Anesthesia Plan Comments:        Anesthesia Quick Evaluation

## 2014-04-16 NOTE — Progress Notes (Signed)
Utilization review completed.  

## 2014-04-17 ENCOUNTER — Inpatient Hospital Stay (HOSPITAL_COMMUNITY): Payer: Medicare Other

## 2014-04-17 LAB — URINALYSIS, ROUTINE W REFLEX MICROSCOPIC
BILIRUBIN URINE: NEGATIVE
GLUCOSE, UA: NEGATIVE mg/dL
Hgb urine dipstick: NEGATIVE
KETONES UR: NEGATIVE mg/dL
Leukocytes, UA: NEGATIVE
Nitrite: NEGATIVE
PH: 5.5 (ref 5.0–8.0)
Protein, ur: NEGATIVE mg/dL
Specific Gravity, Urine: 1.016 (ref 1.005–1.030)
Urobilinogen, UA: 0.2 mg/dL (ref 0.0–1.0)

## 2014-04-17 LAB — CBC
HEMATOCRIT: 31.4 % — AB (ref 39.0–52.0)
Hemoglobin: 10.7 g/dL — ABNORMAL LOW (ref 13.0–17.0)
MCH: 31.9 pg (ref 26.0–34.0)
MCHC: 34.1 g/dL (ref 30.0–36.0)
MCV: 93.7 fL (ref 78.0–100.0)
Platelets: 216 10*3/uL (ref 150–400)
RBC: 3.35 MIL/uL — ABNORMAL LOW (ref 4.22–5.81)
RDW: 12.8 % (ref 11.5–15.5)
WBC: 12.2 10*3/uL — AB (ref 4.0–10.5)

## 2014-04-17 LAB — GLUCOSE, CAPILLARY
Glucose-Capillary: 145 mg/dL — ABNORMAL HIGH (ref 70–99)
Glucose-Capillary: 167 mg/dL — ABNORMAL HIGH (ref 70–99)
Glucose-Capillary: 170 mg/dL — ABNORMAL HIGH (ref 70–99)
Glucose-Capillary: 172 mg/dL — ABNORMAL HIGH (ref 70–99)
Glucose-Capillary: 184 mg/dL — ABNORMAL HIGH (ref 70–99)

## 2014-04-17 LAB — BASIC METABOLIC PANEL
BUN: 33 mg/dL — AB (ref 6–23)
CALCIUM: 8.5 mg/dL (ref 8.4–10.5)
CHLORIDE: 96 meq/L (ref 96–112)
CO2: 23 mEq/L (ref 19–32)
CREATININE: 1.97 mg/dL — AB (ref 0.50–1.35)
GFR calc non Af Amer: 32 mL/min — ABNORMAL LOW (ref >90)
GFR, EST AFRICAN AMERICAN: 38 mL/min — AB (ref >90)
Glucose, Bld: 213 mg/dL — ABNORMAL HIGH (ref 70–99)
Potassium: 4.7 mEq/L (ref 3.7–5.3)
Sodium: 134 mEq/L — ABNORMAL LOW (ref 137–147)

## 2014-04-17 LAB — HEMOGLOBIN A1C
Hgb A1c MFr Bld: 7.4 % — ABNORMAL HIGH (ref <5.7)
MEAN PLASMA GLUCOSE: 166 mg/dL — AB (ref <117)

## 2014-04-17 MED ORDER — METOCLOPRAMIDE HCL 5 MG/ML IJ SOLN
10.0000 mg | Freq: Four times a day (QID) | INTRAMUSCULAR | Status: DC | PRN
  Filled 2014-04-17: qty 2

## 2014-04-17 MED ORDER — HYDROCODONE-ACETAMINOPHEN 7.5-325 MG PO TABS
1.0000 | ORAL_TABLET | Freq: Four times a day (QID) | ORAL | Status: DC | PRN
  Administered 2014-04-17 – 2014-04-18 (×3): 1 via ORAL
  Filled 2014-04-17 (×4): qty 1

## 2014-04-17 MED ORDER — TAMSULOSIN HCL 0.4 MG PO CAPS
0.4000 mg | ORAL_CAPSULE | Freq: Every day | ORAL | Status: DC
  Administered 2014-04-17 – 2014-04-18 (×2): 0.4 mg via ORAL
  Filled 2014-04-17 (×2): qty 1

## 2014-04-17 MED ORDER — METHOCARBAMOL 750 MG PO TABS
750.0000 mg | ORAL_TABLET | Freq: Four times a day (QID) | ORAL | Status: DC | PRN
  Administered 2014-04-17: 750 mg via ORAL
  Filled 2014-04-17: qty 1

## 2014-04-17 MED ORDER — METHOCARBAMOL 100 MG/ML IJ SOLN
500.0000 mg | Freq: Four times a day (QID) | INTRAMUSCULAR | Status: DC | PRN
  Filled 2014-04-17: qty 5

## 2014-04-17 MED ORDER — ACETAMINOPHEN 500 MG PO TABS
1000.0000 mg | ORAL_TABLET | Freq: Once | ORAL | Status: AC
  Administered 2014-04-17: 1000 mg via ORAL
  Filled 2014-04-17: qty 2

## 2014-04-17 MED ORDER — METOCLOPRAMIDE HCL 5 MG/5ML PO SOLN
10.0000 mg | Freq: Four times a day (QID) | ORAL | Status: DC | PRN
  Administered 2014-04-18: 10 mg via ORAL
  Filled 2014-04-17: qty 10

## 2014-04-17 NOTE — Progress Notes (Signed)
Patient unable to void - bladder scan 400 ml. Positive rectal sensation,tone. Patient has slight urge to void. MD office called.

## 2014-04-17 NOTE — Progress Notes (Signed)
Patient wanted hydrocodone - but didn't take it due to "over-the-limit" warning for Tylenol. Hydrocodone wasted with Levada Dy, RN.

## 2014-04-17 NOTE — Evaluation (Signed)
Agree with SPT.    Reinhold Rickey, PT [phone]  

## 2014-04-17 NOTE — Progress Notes (Signed)
PA present -orders received.

## 2014-04-17 NOTE — Op Note (Signed)
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  OPERATIVE PROCEDURE:  Lateral interbody fusion L2-3, L3-4 with PEEK interbody spacer from [employer], size 12 parallel at L2-3 and size 14 lordotic at L3 5.  Posterior pedicle screw fixation at L4-5, [id number], and L4.  COMPLICATIONS:  None.  CONDITION:  Stable.  FIRST ASSISTANT:  Alyson Locket. Velora Heckler.  HISTORY:  This is a very pleasant 73 year old gentleman, who had previous spinal surgery and was still having significant back and buttock pain.  The clinical x-rays demonstrated recurrent spinal stenosis at L3-4 with degenerative scoliosis and collapse of the T3 disk.  As a result of the deformity and the significant pain and failure to improve with injection therapy, physiotherapy, narcotic, and nonnarcotic medications, we elected to proceed with surgery.  All appropriate risks, benefits, and alternatives were discussed with the patient.  Consent was obtained.  OPERATIVE NOTE:  The patient was brought to the operating room, placed supine on the operating table.  After successful induction of general anesthesia and endotracheal intubation, TEDs, SCDs, and Foley were inserted.  Needles were placed into the lower extremity for intraoperative neuromonitoring.  The patient was turned onto the lateral decubitus position left side up.  Axillary roll was placed.  All bony prominences were well padded, and then the flank and back were prepped and draped in a standard fashion.  The  patient was taped to the bed and secured.  X-rays demonstrated a satisfactory position in both the AP and lateral planes.  Once the patient was secured in position, placed and prepped and draped, a time-out was done to confirm patient, procedure, and all other pertinent important data.  Once this was done, x-ray was used to identify the medial portion of the L3 vertebral body.  The anterior and posterior margins of the vertebral body were marked out and that incision site was infiltrated with 0.25% Marcaine, lateral incision was made and centered over the L3 vertebral body.  I then dissected down to the fascia of the external oblique.  I made a second incision 1 fingerbreadth posterolateral to that and then dissected down to the retroperitoneal space.  I entered the retroperitoneal space with a blunt Kelly trocar and then placed my finger into the retroperitoneal space. I palpated the surface of the psoas muscle and then mobilized the retroperitoneal fascia to ease the positioning of the trocar.  Once this was done, I advanced the trocar into the retroperitoneal space and guided down to the surface of the psoas muscle.  I then stimulated the surface of the psoas muscle and centered over the L2-3 disk space, and made sure I was not on top of the lumbar plexus.  I advanced the trocar through the psoas down to the lateral  aspect of the disk space at L2-3. I secured it with the guide pin and then circumferentially stimulated to ensure that there was no trauma to the plexus.  Once this was done, I sequentially dilated up, stimming all dilators until I was properly positioned.  There was no evidence of injury or trauma to the plexus.  I then placed the retracting device over the final dilator and secured it to the table.  Once this was done, I used the trocar and secured into the disk space.  I again stimulated all circumferentially around to confirm that there was no tension to the plexus.   There was also no abnormal free running EMGs.  Once the retractor was properly positioned, I had a clear visualization of the L2-3 disk space.  I incised the annulus with a #10 blade scalpel and then used a pituitary rongeurs, curettes, and Kerrison rongeurs to resect all the disk material across the contralateral side.  A Cobb was used to break through the contralateral side and released the annulus.  I then used curettes again to remove the subchondral bone until I had removed the cartilaginous endplate, so I rasped the endplates.  I then trialed sequentially from with an 8 parallel device up to a 12 parallel.  The 12 parallel actually corrected the deformity quite well and had excellent purchase.  I obtained 18 x 12 PEEK interbody spacer, packed it with the graft, and then malleted it to the appropriate depth.  It was properly seated, and I confirmed satisfactory position in both planes.  I then removed the retractor device and allowed the lumbar plexus and then waited about 10 minutes before I repositioned at the L3-4 level.  At this point, I used the same technique to position the retractor that I did at L2-3.  Once the retractor was secured into place.  Again, I stimulated circumferentially and there was no free running EMGs, nor was there any direct evidence of plexus trauma or compression.  At this point, with the 3-4 disk exposed, I used the same technique to resect all the disk material creating a channel from the left to the right side.  I rasped the endplates.  I had removed the cartilaginous endplate, so I had bleeding subchondral bone.  I again trialed, at this time, I elected to use a 14 x 12 spacer.  Once this was packed with the graft and positioned and had excellent purchase.  I removed all the retractors and irrigated this wound with copiously normal saline.  I closed the fascia of the external oblique with interrupted #1 Vicryl sutures and a layer of 2-0 Vicryl  sutures, and 3-0 Monocryl.  The 2nd incision I made for my finger dissection was also irrigated and closed in a similar fashion.  I then returned the bed into the neutral position.  I had satisfactory position of the lateral cages and good correction of his curvature.  At this point, in order to provide adequate stability, I elected to the place the pedicle screws posteriorly.  First, I did the left side.  With the patient still in the lateral decubitus position, I made an incision about 2 fingerbreadths from the midline and advanced the Jamshidi needle down to the lateral aspect of the pedicle.  I confirmed satisfactory position in both planes.  I was at the junction of the TP and facet.  I advanced the Jamshidi needle, stimulating as I went into the pedicle and into the vertebral body.  I cannulated  the pedicle.  I then repeated this procedure at L3 and L4 and on the contralateral side.  Once all 6 pedicles were cannulated, I then tapped over the guide pins and then placed appropriate size screws.  Once all 6 pedicle screws were positioned, I then stimulated each pedicle screw independently and confirmed that there was no wound abnormal EMG findings to suggests pedicle breach.  X-rays also were satisfactory in both planes.  I then obtained appropriate size rod and placed it into percutaneously placed pedicle screws system and locked it into place.  All 6 locking caps were torqued down appropriately.  I then took final x-rays and had excellent posterior instrumented fixation with 6 pedicle screws and 2 rods.  I then removed all the retractors irrigated the 6 small incisions I made to place the percutaneous screws and closed them with interrupted 2-0 Vicryl sutures, and 3-0 Monocryl.  Steri-Strips and dry dressings were applied.  The patient was ultimately extubated and transferred to the PACU without incident.  At the end of the case, all needle and sponge counts were correct.  There was  no adverse intraoperative events.  My first assistant, Benjiman Core was instrumental in assisting me with this difficult case with retraction, visualization, suction, and wound closure.     Dahlia Bailiff, MD     DDB/MEDQ  D:  04/16/2014  T:  04/17/2014  Job:  [id number]

## 2014-04-17 NOTE — Progress Notes (Signed)
Patient with intermittent nausea - order received for Reglan - then patient didn't need and wasted.

## 2014-04-17 NOTE — Progress Notes (Signed)
Occupational Therapy Evaluation Patient Details Name: Allen Medina MRN: [id number] DOB: May 04, 1941 Today's Date: 04/17/2014    History of Present Illness Pt is a 73 y.o. male presenting post L2-4 XLIF.  Pt hx of two prior back surgeries.   Clinical Impression   PTA pt lived at home with wife and was independent with ADLs and functional mobility. Pt reports wearing slip-on shoes and no socks for ease of LB ADLs. Educated pt and wife on 3/3 back precautions and incorporating into ADLs. Pt declined OOB activities as he was limited by pain and nausea/dizziness and brief assessment completed. Pt would benefit from continued skilled OT for AE training and functional mobility to promote independence with ADLs prior to d/c.     Follow Up Recommendations  No OT follow up;Supervision/Assistance - 24 hour    Equipment Recommendations  None recommended by OT       Precautions / Restrictions Precautions Precautions: Back;Fall Precaution Booklet Issued: Yes (comment) Precaution Comments: Educated pt on 3/3 back precautions with teach back and incorporating into ADLs. Required Braces or Orthoses: Spinal Brace Spinal Brace: Lumbar corset;Applied in sitting position Restrictions Weight Bearing Restrictions: No      Mobility Bed Mobility Overal bed mobility: Needs Assistance Bed Mobility: Rolling;Sidelying to Sit Rolling: Min assist Sidelying to sit: Mod assist       General bed mobility comments: Not assessed; reviewed log roll technique  Transfers Overall transfer level: Needs assistance Equipment used: Rolling walker (2 wheeled) Transfers: Sit to/from Stand Sit to Stand: Min assist         General transfer comment: Not assessed as pt declined OOB activites; limited by pain and nausea    Balance Overall balance assessment: Needs assistance         Standing balance support: Bilateral upper extremity supported;Single extremity supported Standing balance-Leahy Scale: Fair                              ADL Overall ADL's : Needs assistance/impaired Eating/Feeding: Independent;Sitting   Grooming: Oral care;Wash/dry face;Set up;Sitting   Upper Body Bathing: Set up;Sitting   Lower Body Bathing: Moderate assistance;Sit to/from stand;Adhering to back precautions   Upper Body Dressing : Set up;Sitting   Lower Body Dressing: Maximal assistance;Sit to/from stand;Adhering to back precautions                 General ADL Comments: Pt with c/o dizziness and nausea limiting participation in OT session. Brief assessment occurred as pt declined OOB activities.      Vision  Per pt report, no change from baseline.                      Praxis Praxis Praxis tested?: Within functional limits    Pertinent Vitals/Pain Pt reports pain as moderate, no pain score provided. Pt in bed with wet cloth over face and c/o dizziness/nausea. Educated pt on importance of sitting up to decrease nausea with changes in position. Encouraged pt to increase HOB angle to promote circulation.     Hand Dominance Right   Extremity/Trunk Assessment Upper Extremity Assessment Upper Extremity Assessment: Overall WFL for tasks assessed   Lower Extremity Assessment Lower Extremity Assessment: Defer to PT evaluation   Cervical / Trunk Assessment Cervical / Trunk Assessment: Normal   Communication Communication Communication: No difficulties   Cognition Arousal/Alertness: Awake/alert Behavior During Therapy: WFL for tasks assessed/performed Overall Cognitive Status: Within Functional Limits for tasks assessed  Home Living Family/patient expects to be discharged to:: Private residence Living Arrangements: Spouse/significant other Available Help at Discharge: Family Type of Home: House Home Access: Stairs to enter (one small step from garage) Technical brewer of Steps: 1 Entrance Stairs-Rails: None Home  Layout: One level     Bathroom Shower/Tub: Occupational psychologist: [place]: Environmental consultant - standard;Bedside commode          Prior Functioning/Environment Level of Independence: Independent        Comments: Pt reports he was donning/doffing slip-on shoes and no socks independently.     OT Diagnosis: Generalized weakness;Acute pain   OT Problem List: Decreased strength;Decreased range of motion;Decreased activity tolerance;Impaired balance (sitting and/or standing);Decreased knowledge of use of DME or AE;Decreased knowledge of precautions;Pain;Decreased safety awareness   OT Treatment/Interventions: Self-care/ADL training;Therapeutic exercise;Energy conservation;DME and/or AE instruction;Therapeutic activities;Patient/family education;Balance training    OT Goals(Current goals can be found in the care plan section) Acute Rehab OT Goals Patient Stated Goal: Return to home OT Goal Formulation: With patient Time For Goal Achievement: 04/24/14 Potential to Achieve Goals: Good ADL Goals Pt Will Perform Grooming: with modified independence;standing Pt Will Perform Lower Body Bathing: with set-up;with supervision;with adaptive equipment;sit to/from stand Pt Will Perform Lower Body Dressing: with set-up;with supervision;with adaptive equipment;sit to/from stand Pt Will Transfer to Toilet: with modified independence;ambulating;bedside commode Pt Will Perform Toileting - Clothing Manipulation and hygiene: with set-up;with supervision;with adaptive equipment;sit to/from stand Pt Will Perform Tub/Shower Transfer: Shower transfer;with modified independence;ambulating;3 in 1;rolling walker Additional ADL Goal #1: Pt will perform bed mobility at Mod Independent level using log roll technique to prepare for ADLs  OT Frequency: Min 2X/week    End of Session  Activity Tolerance: Patient limited by pain;Other (comment) (pt limited by nausea/dizziness) Patient left: in  bed;with call bell/phone within reach;with family/visitor present   Time: 1350-1420 OT Time Calculation (min): 30 min Charges:  OT General Charges $OT Visit: 1 Procedure OT Evaluation $Initial OT Evaluation Tier I: 1 Procedure OT Treatments $Self Care/Home Management : 23-37 mins  Juluis Rainier [id number] 04/17/2014, 2:30 PM

## 2014-04-17 NOTE — Evaluation (Signed)
Physical Therapy Evaluation Patient Details Name: Allen Medina MRN: [id number] DOB: 1941/11/30 Today's Date: 04/17/2014   History of Present Illness  Pt is a 73 y.o. male presenting post L2-4 XLIF.  Pt hx of two prior back surgeries.  Clinical Impression  Pt is motivated and moving well.  Pain is biggest limiting factor in gait and activities.  Patient will only need rolling walker for home.  Will continue to follow.    Follow Up Recommendations Home health PT;Supervision for mobility/OOB    Equipment Recommendations  Rolling walker with 5" wheels    Recommendations for Other Services       Precautions / Restrictions Precautions Precautions: Back;Fall Precaution Booklet Issued: Yes (comment) Required Braces or Orthoses: Spinal Brace Spinal Brace: Lumbar corset;Applied in sitting position Restrictions Weight Bearing Restrictions: No      Mobility  Bed Mobility Overal bed mobility: Needs Assistance Bed Mobility: Rolling;Sidelying to Sit Rolling: Min assist Sidelying to sit: Mod assist       General bed mobility comments: Cueing for log roll technique and sequencing of movements, also cueing for back precautions during bed mobility  Transfers Overall transfer level: Needs assistance Equipment used: Rolling walker (2 wheeled) Transfers: Sit to/from Stand Sit to Stand: Min assist         General transfer comment: Cueing to scoot to EOB, putting feet flat on floor, pushing through hands to stand.  To sit, cueing to get close prior to sitting, use UE, and to scoot back in chair.  Ambulation/Gait Ambulation/Gait assistance: Min guard Ambulation Distance (Feet): 250 Feet Assistive device: Rolling walker (2 wheeled) Gait Pattern/deviations: Step-through pattern;Decreased stride length     General Gait Details: Cueing during turns for back precautions, safety with rolling walker  Stairs            Wheelchair Mobility    Modified Rankin (Stroke Patients  Only)       Balance Overall balance assessment: Needs assistance         Standing balance support: Bilateral upper extremity supported;Single extremity supported Standing balance-Leahy Scale: Fair                               Pertinent Vitals/Pain Patient reports pain in his L side around incision after transporting to and from x-ray.  Pt premedicated.    Home Living Family/patient expects to be discharged to:: Private residence Living Arrangements: Spouse/significant other Available Help at Discharge: Family Type of Home: House Home Access: Stairs to enter (one small step to enter from garage) Entrance Stairs-Rails: None Technical brewer of Steps: 1 Home Layout: One level Home Equipment: Walker - standard;Bedside commode      Prior Function Level of Independence: Independent               Hand Dominance        Extremity/Trunk Assessment   Upper Extremity Assessment: Defer to OT evaluation           Lower Extremity Assessment: Overall WFL for tasks assessed      Cervical / Trunk Assessment: Normal  Communication   Communication: No difficulties  Cognition Arousal/Alertness: Awake/alert Behavior During Therapy: WFL for tasks assessed/performed Overall Cognitive Status: Within Functional Limits for tasks assessed                      General Comments      Exercises        Assessment/Plan  PT Assessment Patient needs continued PT services  PT Diagnosis Difficulty walking;Acute pain   PT Problem List Decreased mobility;Decreased balance;Decreased activity tolerance;Decreased knowledge of use of DME;Decreased knowledge of precautions;Pain  PT Treatment Interventions DME instruction;Gait training;Stair training;Functional mobility training;Therapeutic activities;Balance training;Patient/family education   PT Goals (Current goals can be found in the Care Plan section) Acute Rehab PT Goals Patient Stated Goal: Return  to home PT Goal Formulation: With patient/family Time For Goal Achievement: 04/24/14 Potential to Achieve Goals: Good    Frequency Min 5X/week   Barriers to discharge        Co-evaluation               End of Session Equipment Utilized During Treatment: Gait belt;Back brace Activity Tolerance: Patient tolerated treatment well Patient left: in chair;with call bell/phone within reach;with family/visitor present           Time: 1053-1130 PT Time Calculation (min): 37 min   Charges:         PT G Codes:          Talton Delpriore, SPT 04/17/2014, 2:00 PM

## 2014-04-17 NOTE — Progress Notes (Signed)
Patient ID: Allen Medina, male   DOB: 1941-04-10, 73 y.o.   MRN: [id number]    Subjective: 1 Day Post-Op Procedure(s) (LRB): Lateral  Lumbar Two-Three/Three-Four Interbody and Fusion with Lateral Pedicle Screw Fixation Lumbar Two-Four (N/A) Patient reports pain as mild.    admits nausea. Denies CP or SOB.   Objective: Vital signs in last 24 hours: Temp:  [97.3 F (36.3 C)-97.8 F (36.6 C)] 97.8 F (36.6 C) (04/30 0436) Pulse Rate:  [53-76] 66 (04/30 0436) Resp:  [10-21] 12 (04/30 0436) BP: (117-157)/(54-72) 118/64 mmHg (04/30 0436) SpO2:  [94 %-100 %] 99 % (04/30 0436)  Intake/Output from previous day: 04/29 0701 - 04/30 0700 In: 3352.5 [I.V.:3252.5] Out: 1100 [Urine:950; Blood:150] Intake/Output this shift:    Labs: No results found for this basename: HGB,  in the last 72 hours No results found for this basename: WBC, RBC, HCT, PLT,  in the last 72 hours No results found for this basename: NA, K, CL, CO2, BUN, CREATININE, GLUCOSE, CALCIUM,  in the last 72 hours No results found for this basename: LABPT, INR,  in the last 72 hours  Physical Exam: Neurologically intact ABD soft Dorsiflexion/Plantar flexion intact  Assessment/Plan: 1 Day Post-Op Procedure(s) (LRB): Lateral  Lumbar Two-Three/Three-Four Interbody and Fusion with Lateral Pedicle Screw Fixation Lumbar Two-Four (N/A) Advance diet Check cbc and bmet Start PT Anticipate d/c home Friday or Saturday if progressing will and medically stable   Benjiman Core for Dr. Melina Schools [facility] [phone] 04/17/2014, 12:02 PM

## 2014-04-18 LAB — CBC WITH DIFFERENTIAL/PLATELET
BASOS ABS: 0 10*3/uL (ref 0.0–0.1)
BASOS PCT: 0 % (ref 0–1)
EOS PCT: 1 % (ref 0–5)
Eosinophils Absolute: 0.1 10*3/uL (ref 0.0–0.7)
HCT: 30.7 % — ABNORMAL LOW (ref 39.0–52.0)
Hemoglobin: 10.4 g/dL — ABNORMAL LOW (ref 13.0–17.0)
Lymphocytes Relative: 11 % — ABNORMAL LOW (ref 12–46)
Lymphs Abs: 1.3 10*3/uL (ref 0.7–4.0)
MCH: 31.6 pg (ref 26.0–34.0)
MCHC: 33.9 g/dL (ref 30.0–36.0)
MCV: 93.3 fL (ref 78.0–100.0)
MONO ABS: 1.3 10*3/uL — AB (ref 0.1–1.0)
Monocytes Relative: 11 % (ref 3–12)
Neutro Abs: 8.9 10*3/uL — ABNORMAL HIGH (ref 1.7–7.7)
Neutrophils Relative %: 77 % (ref 43–77)
Platelets: 188 10*3/uL (ref 150–400)
RBC: 3.29 MIL/uL — ABNORMAL LOW (ref 4.22–5.81)
RDW: 12.8 % (ref 11.5–15.5)
WBC: 11.7 10*3/uL — ABNORMAL HIGH (ref 4.0–10.5)

## 2014-04-18 LAB — BASIC METABOLIC PANEL
BUN: 36 mg/dL — ABNORMAL HIGH (ref 6–23)
CO2: 24 meq/L (ref 19–32)
CREATININE: 2.08 mg/dL — AB (ref 0.50–1.35)
Calcium: 8.4 mg/dL (ref 8.4–10.5)
Chloride: 98 mEq/L (ref 96–112)
GFR calc non Af Amer: 30 mL/min — ABNORMAL LOW (ref >90)
GFR, EST AFRICAN AMERICAN: 35 mL/min — AB (ref >90)
Glucose, Bld: 225 mg/dL — ABNORMAL HIGH (ref 70–99)
Potassium: 4.6 mEq/L (ref 3.7–5.3)
Sodium: 135 mEq/L — ABNORMAL LOW (ref 137–147)

## 2014-04-18 LAB — URINE CULTURE
Colony Count: NO GROWTH
Culture: NO GROWTH

## 2014-04-18 LAB — GLUCOSE, CAPILLARY
Glucose-Capillary: 219 mg/dL — ABNORMAL HIGH (ref 70–99)
Glucose-Capillary: 210 mg/dL — ABNORMAL HIGH (ref 70–99)

## 2014-04-18 MED ORDER — METHOCARBAMOL 750 MG PO TABS: 750.0000 mg | ORAL_TABLET | Freq: Four times a day (QID) | ORAL | Status: DC | PRN

## 2014-04-18 MED ORDER — MAGNESIUM CITRATE PO SOLN
0.5000 | Freq: Once | ORAL | Status: AC
  Administered 2014-04-18: 0.5 via ORAL
  Filled 2014-04-18: qty 296

## 2014-04-18 MED ORDER — ONDANSETRON HCL 4 MG PO TABS: 4.0000 mg | ORAL_TABLET | Freq: Four times a day (QID) | ORAL | Status: DC | PRN

## 2014-04-18 MED ORDER — HYDROMORPHONE HCL PF 1 MG/ML IJ SOLN
1.0000 mg | INTRAMUSCULAR | Status: DC | PRN
  Administered 2014-04-18: 1 mg via INTRAVENOUS
  Filled 2014-04-18: qty 1

## 2014-04-18 MED ORDER — POLYETHYLENE GLYCOL 3350 17 G PO PACK: 17.0000 g | PACK | Freq: Every day | ORAL | Status: DC

## 2014-04-18 MED ORDER — OXYCODONE-ACETAMINOPHEN 5-325 MG PO TABS
1.0000 | ORAL_TABLET | Freq: Four times a day (QID) | ORAL | Status: DC | PRN
  Administered 2014-04-18: 1 via ORAL
  Filled 2014-04-18: qty 1

## 2014-04-18 MED ORDER — ONDANSETRON 4 MG PO TBDP: 4.0000 mg | ORAL_TABLET | Freq: Three times a day (TID) | ORAL | Status: DC | PRN

## 2014-04-18 MED ORDER — FLEET ENEMA 7-19 GM/118ML RE ENEM
1.0000 | ENEMA | Freq: Once | RECTAL | Status: AC
  Administered 2014-04-18: 1 via RECTAL
  Filled 2014-04-18: qty 1

## 2014-04-18 MED ORDER — DOCUSATE SODIUM 100 MG PO CAPS: 100.0000 mg | ORAL_CAPSULE | Freq: Two times a day (BID) | ORAL | Status: DC

## 2014-04-18 MED ORDER — OXYCODONE-ACETAMINOPHEN 5-325 MG PO TABS: 1.0000 | ORAL_TABLET | Freq: Four times a day (QID) | ORAL | Status: DC | PRN

## 2014-04-18 NOTE — Progress Notes (Signed)
Physical Therapy Treatment Patient Details Name: Allen Medina MRN: [id number] DOB: Nov 28, 1941 Today's Date: 04/18/2014    History of Present Illness Pt is a 73 y.o. male presenting post L2-4 XLIF.  Pt hx of two prior back surgeries.    PT Comments    Pt with increased pain today, but agreeable to mobility.  Pt and wife ed on donning/doffing brace.  Pt concerned about having a BM and RN present with meds.    Follow Up Recommendations  Home health PT;Supervision for mobility/OOB     Equipment Recommendations  Rolling walker with 5" wheels    Recommendations for Other Services       Precautions / Restrictions Precautions Precautions: Back;Fall Precaution Comments: pt ed on 3/3 back precautions as pt unable torecall any precautions.   Required Braces or Orthoses: Spinal Brace Spinal Brace: Lumbar corset;Applied in sitting position Restrictions Weight Bearing Restrictions: No    Mobility  Bed Mobility Overal bed mobility: Needs Assistance Bed Mobility: Rolling;Sidelying to Sit Rolling: Min assist Sidelying to sit: Mod assist       General bed mobility comments: cueing for log roll technique and encouragement.    Transfers Overall transfer level: Needs assistance Equipment used: Rolling walker (2 wheeled) Transfers: Sit to/from Stand Sit to Stand: Min assist         General transfer comment: cues for scooting to EOB, UE use, and controlling descent to sitting.    Ambulation/Gait Ambulation/Gait assistance: Min guard Ambulation Distance (Feet): 200 Feet Assistive device: Rolling walker (2 wheeled) Gait Pattern/deviations: Step-through pattern;Decreased stride length     General Gait Details: pt with increased pain today limiting amb distance.     Stairs Stairs: Yes Stairs assistance: Min assist Stair Management: No rails;Backwards;With walker Number of Stairs: 1 General stair comments: cues for safe technique.  pt limited by pain and needed to sit after  performing step.    Wheelchair Mobility    Modified Rankin (Stroke Patients Only)       Balance                                    Cognition Arousal/Alertness: Awake/alert Behavior During Therapy: WFL for tasks assessed/performed Overall Cognitive Status: Within Functional Limits for tasks assessed                      Exercises      General Comments        Pertinent Vitals/Pain 8/10 low back.  RN arrived to medicate.      Home Living                      Prior Function            PT Goals (current goals can now be found in the care plan section) Acute Rehab PT Goals Patient Stated Goal: Return to home Time For Goal Achievement: 04/24/14 Potential to Achieve Goals: Good Progress towards PT goals: Progressing toward goals    Frequency  Min 5X/week    PT Plan Current plan remains appropriate    Co-evaluation             End of Session Equipment Utilized During Treatment: Gait belt;Back brace Activity Tolerance: Patient limited by pain Patient left: in chair;with call bell/phone within reach;with family/visitor present     Time: [id number] PT Time Calculation (min): 35 min  Charges:  $Gait Training:  23-37 mins                    G CodesCatarina Hartshorn, [place] [id number] 04/18/2014, 10:10 AM

## 2014-04-18 NOTE — Progress Notes (Signed)
    Subjective: Procedure(s) (LRB): Lateral  Lumbar Two-Three/Three-Four Interbody and Fusion with Lateral Pedicle Screw Fixation Lumbar Two-Four (N/A) 2 Days Post-Op  Patient reports pain as 3 on 0-10 scale.  Reports decreased leg pain reports incisional back pain   Positive void Negative bowel movement Negative flatus Positive chest pain or shortness of breath  Objective: Vital signs in last 24 hours: Temp:  [97.4 F (36.3 C)-98.3 F (36.8 C)] 98.3 F (36.8 C) (05/01 0514) Pulse Rate:  [73-89] 85 (05/01 0514) Resp:  [18] 18 (05/01 0514) BP: (116-135)/(51-57) 117/51 mmHg (05/01 0514) SpO2:  [92 %-97 %] 97 % (05/01 0514)  Intake/Output from previous day: 04/30 0701 - 05/01 0700 In: 960 [P.O.:960] Out: 900 [Urine:900]  Labs:  Recent Labs  04/17/14 1248  WBC 12.2*  RBC 3.35*  HCT 31.4*  PLT 216    Recent Labs  04/17/14 1248  NA 134*  K 4.7  CL 96  CO2 23  BUN 33*  CREATININE 1.97*  GLUCOSE 213*  CALCIUM 8.5   No results found for this basename: LABPT, INR,  in the last 72 hours  Physical Exam: Neurologically intact ABD soft Intact pulses distally Incision: dressing C/D/I and no drainage Compartment soft  Assessment/Plan: Patient stable  xrays satisfactory Continue mobilization with physical therapy Continue care  Advance diet Up with therapy D/C IV fluids Repeat labs this AM Bowel meds for constipation Possible d/c today or in AM  Melina Schools, MD [place] [phone]

## 2014-04-18 NOTE — Care Management Note (Signed)
CARE MANAGEMENT NOTE 04/18/2014  Patient:  MYLEZ, NORMAN   Account Number:  [id number]  Date Initiated:  04/18/2014  Documentation initiated by:  Ricki Miller  Subjective/Objective Assessment:   73 yr old male s/p L2-L4 XLIF     Action/Plan:   Case Manager spoke with patient concerning home health and DME needs at discharge. Choice offered. Referral called to [facility], [place]. PAteint has family support at discharge.   Anticipated DC Date:  04/18/2014   Anticipated DC Plan:  [place] [facility]  CM consult      [facility] Choice  HOME HEALTH  DURABLE MEDICAL EQUIPMENT   Choice offered to / List presented to:  C-1 Patient   DME arranged  Vassie Moselle      DME agency  [place] arranged  [place].   Status of service:  Completed, signed off Medicare Important Message given?  YES (If response is "NO", the following Medicare IM given date fields will be blank) Date Medicare IM given:  04/08/2014 Date Additional Medicare IM given:    Discharge Disposition:  [place]

## 2014-04-18 NOTE — Progress Notes (Signed)
CSW (Clinical Education officer, museum) received consult. At this time, PT is recommending [place] services. Please reconsult if social work needs arise.  [place], [place]

## 2014-04-18 NOTE — Progress Notes (Signed)
OT Cancellation and Discharge Note  Patient Details Name: Allen Medina MRN: [id number] DOB: 01/21/1941   Cancelled Treatment:    Reason Eval/Treat Not Completed: Patient declined, no reason specified. Pt seen today for ADL session to promote independence upon discharge home. Pt reports that he has no further ADL concerns and has been ambulating with PT and RN staff. OT reiterated fall prevention, safety with DME, and compensatory techniques for LB ADLs and pt recalled 3/3 back precautions. Acute OT to sign off at this time.   Juluis Rainier [id number] 04/18/2014, 3:47 PM

## 2014-04-21 ENCOUNTER — Encounter (HOSPITAL_COMMUNITY): Payer: Self-pay | Admitting: Orthopedic Surgery

## 2014-04-22 NOTE — Discharge Summary (Signed)
Patient ID: NEILSON MELDE MRN: [id number] DOB/AGE: [birth year] 73 y.o.  Admit date: 04/16/2014 Discharge date: 04/22/2014  Admission Diagnoses:  Active Problems:   Back pain   Discharge Diagnoses:  Active Problems:   Back pain  status post Procedure(s): Lateral  Lumbar Two-Three/Three-Four Interbody and Fusion with Lateral Pedicle Screw Fixation Lumbar Two-Four  Past Medical History  Diagnosis Date  . S/P CABG x 4 2000    SVG-OM1, SVG-OM 2-OM 3, SVG-diagonal, free RIMA-RPDA ( has stent in the vessel from 2003)-  . CAD, multiple vessel 1991    last cath 2006-patent grafts, last nuc 09/10/09-no ischemia  . CAD (coronary artery disease) of bypass graft 2000    Occluded grafts --> redo CABG  . Peripheral vascular disease     peripheral neuropathy, claudication  . Claudication     LE dopplers 03/17/10, normal ABIs, normal pressures  . HTN (hypertension)     difficult to control  . Hyperlipidemia LDL goal < 100   . Spinal stenosis   . Vitamin B 12 deficiency   . Neuropathy in diabetes   . History of blood transfusion 2010    "after back OR" (04/16/2014)  . Pneumonia     hx of  . Thin skin   . Blindness of left eye   . Myocardial infarction 2000    LOV with EKG Dr Rex Kras 7/12 on chart, eccho 10/03, stress test 9/10 on chart, chest x ray 2/13 EPIC, chest CT 02/13/12 on chart  . S/P CABG x 5     only LIMA-LAD still patent  . Diabetes mellitus type 2 with complications     renal insuff, HTN, CAD, PVD  . Arthritis     "hands" (04/16/2014)  . Chronic lower back pain   . CKD (chronic kidney disease) stage 4, GFR 15-29 ml/min     kidney stones,cmet 02/09/12, 02/13/12 on chart  . Kidney stones     Surgeries: Procedure(s): Lateral  Lumbar Two-Three/Three-Four Interbody and Fusion with Lateral Pedicle Screw Fixation Lumbar Two-Four on 04/16/2014   Consultants:    Discharged Condition: Improved  Hospital Course: KHRISTIAN BOLAND is an 73 y.o. male who was admitted  04/16/2014 for operative treatment of L2-4 ddd/stenosis. Patient failed conservative treatments (please see the history and physical for the specifics) and had severe unremitting pain that affects sleep, daily activities and work/hobbies. After pre-op clearance, the patient was taken to the operating room on 04/16/2014 and underwent  Procedure(s): Lateral  Lumbar Two-Three/Three-Four Interbody and Fusion with Lateral Pedicle Screw Fixation Lumbar Two-Four.    Patient was given perioperative antibiotics:  Anti-infectives   Start     Dose/Rate Route Frequency Ordered Stop   04/16/14 1700  ceFAZolin (ANCEF) IVPB 1 g/50 mL premix     1 g 100 mL/hr over 30 Minutes Intravenous Every 8 hours 04/16/14 1557 04/17/14 0106   04/15/14 1402  ceFAZolin (ANCEF) IVPB 2 g/50 mL premix     2 g 100 mL/hr over 30 Minutes Intravenous 30 min pre-op 04/15/14 1402 04/16/14 0916       Patient was given sequential compression devices and early ambulation to prevent DVT.   Patient benefited maximally from hospital stay and there were no complications. At the time of discharge, the patient was urinating/moving their bowels without difficulty, tolerating a regular diet, pain is controlled with oral pain medications and they have been cleared by PT/OT.   Recent vital signs: No data found.    Recent laboratory studies: No  results found for this basename: WBC, HGB, HCT, PLT, NA, K, CL, CO2, BUN, CREATININE, GLUCOSE, PT, INR, CALCIUM, 2,  in the last 72 hours   Discharge Medications:     Medication List    STOP taking these medications       traMADol 50 MG tablet  Commonly known as:  ULTRAM      TAKE these medications       carvedilol 3.125 MG tablet  Commonly known as:  COREG  Take 3.125 mg by mouth 2 (two) times daily with a meal.     cyanocobalamin 1000 MCG/ML injection  Commonly known as:  (VITAMIN B-12)  Inject 1,000 mcg into the muscle every 30 (thirty) days. Next due 04/12/2014     docusate sodium  100 MG capsule  Commonly known as:  COLACE  Take 1 capsule (100 mg total) by mouth 2 (two) times daily.     HUMALOG KWIKPEN 100 UNIT/ML KiwkPen  Generic drug:  insulin lispro  Inject 4 Units into the skin daily at 12 noon.     insulin glargine 100 UNIT/ML injection  Commonly known as:  LANTUS  Inject 16 Units into the skin daily.     LORazepam 0.5 MG tablet  Commonly known as:  ATIVAN  Take 0.5 mg by mouth at bedtime.     Melatonin 5 MG Subl  Place 5 mg under the tongue at bedtime as needed (sleep).     methocarbamol 750 MG tablet  Commonly known as:  ROBAXIN  Take 1 tablet (750 mg total) by mouth every 6 (six) hours as needed for muscle spasms.     ondansetron 4 MG disintegrating tablet  Commonly known as:  ZOFRAN ODT  Take 1 tablet (4 mg total) by mouth every 8 (eight) hours as needed for nausea or vomiting.     oxyCODONE-acetaminophen 5-325 MG per tablet  Commonly known as:  PERCOCET/ROXICET  Take 1 tablet by mouth every 6 (six) hours as needed for moderate pain or severe pain.     polyethylene glycol packet  Commonly known as:  MIRALAX / GLYCOLAX  Take 17 g by mouth daily.     pregabalin 75 MG capsule  Commonly known as:  LYRICA  Take 75 mg by mouth 3 (three) times daily.     rosuvastatin 40 MG tablet  Commonly known as:  CRESTOR  Take 40 mg by mouth daily.     sitaGLIPtin 50 MG tablet  Commonly known as:  JANUVIA  Take 50 mg by mouth daily.     tadalafil 5 MG tablet  Commonly known as:  CIALIS  Take 5 mg by mouth at bedtime.     telmisartan-hydrochlorothiazide 80-25 MG per tablet  Commonly known as:  MICARDIS HCT  Take 1 tablet by mouth daily.     zolpidem 12.5 MG CR tablet  Commonly known as:  AMBIEN CR  Take 12.5 mg by mouth at bedtime.        Diagnostic Studies: Dg Chest 2 View  04/08/2014   CLINICAL DATA:  Preoperative evaluation.  EXAM: CHEST  2 VIEW  COMPARISON:  NM MYOCAR MULTI w/SPECT w/WALL MOTION / EF dated 03/18/2013; DG CHEST 2 VIEW dated  03/17/2013  FINDINGS: Patient status post median sternotomy. Multiple fractures sternotomy wires are re- demonstrated. No consolidative pulmonary opacities. No pleural effusion or pneumothorax. Regional skeleton is unremarkable.  IMPRESSION: No acute cardiopulmonary process.   Electronically Signed   By: Lovey Newcomer M.D.   On: 04/08/2014 13:08  Dg Lumbar Spine 2-3 Views  04/17/2014   CLINICAL DATA:  Postop lumbar surgery  EXAM: LUMBAR SPINE - 2-3 VIEW  COMPARISON:  DG LUMBAR SPINE 2-3 VIEWS dated 04/16/2014; DG LUMBAR SPINE 2-3 VIEWS dated 04/08/2014  FINDINGS: Posterior lumbar fusion from L2 through L4 with pedicle screws and fusion rods. Interbody spacers noted. No evidence of subluxation. 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  IMPRESSION: Posterior lumbar fusion from [id number] without complicating features.   Electronically Signed   By: Suzy Bouchard M.D.   On: 04/17/2014 08:07   Dg Lumbar Spine 2-3 Views  04/16/2014   CLINICAL DATA:  Lumbar fusion.  EXAM: LUMBAR SPINE - 2-3 VIEW; DG C-ARM GT 120 MIN  COMPARISON:  Radiographs 04/08/2014  FINDINGS: Posterior and interbody fusion changes are present at L2-3 and L3-4 with pedicle screws, posterior rods and interbody fusion devices. No complicating features are demonstrated.  IMPRESSION: Posterior and interbody fusion changes at L2-3 and L3-4. No complicating features.   Electronically Signed   By: Kalman Jewels M.D.   On: 04/16/2014 13:49   Dg Lumbar Spine 2-3 Views  04/08/2014   CLINICAL DATA:  Back pain .  EXAM: LUMBAR SPINE - 2-3 VIEW  COMPARISON:  None.  FINDINGS: Diffuse degenerative changes noted of the lumbar spine with scoliosis concave left. Multilevel disc space loss is present. This is particularly prominent at L2-L3. Diffuse facet hypertrophy present. No significant spondylolisthesis. Aortoiliac atherosclerotic vascular disease.  IMPRESSION: Diffuse degenerative changes lumbar spine with mild scoliosis concave left.    Electronically Signed   By: Marcello Moores  Register   On: 04/08/2014 13:20   Dg C-arm Gt 120 Min  04/16/2014   CLINICAL DATA:  Lumbar fusion.  EXAM: LUMBAR SPINE - 2-3 VIEW; DG C-ARM GT 120 MIN  COMPARISON:  Radiographs 04/08/2014  FINDINGS: Posterior and interbody fusion changes are present at L2-3 and L3-4 with pedicle screws, posterior rods and interbody fusion devices. No complicating features are demonstrated.  IMPRESSION: Posterior and interbody fusion changes at L2-3 and L3-4. No complicating features.   Electronically Signed   By: Kalman Jewels M.D.   On: 04/16/2014 13:49        Discharge Orders   Future Appointments Provider Department Dept Phone   03/13/2015 8:45 AM Hayden Pedro, MD [place] [phone]   Future Orders Complete By Expires   Call MD / Call 911  As directed    Constipation Prevention  As directed    Diet - low sodium heart healthy  As directed    Discharge instructions  As directed    Driving restrictions  As directed    Increase activity slowly as tolerated  As directed    Lifting restrictions  As directed       Follow-up Information   Schedule an appointment as soon as possible for a visit with Dahlia Bailiff, MD. (needs return office visit 2 weeks postop.  )    Specialty:  Orthopedic Surgery   Contact information:   [address] [place] [address] [place] [address] [phone]       Follow up with [place]. (Someone from [facility] will contact you concerning start date and time for physical therapy.)    Contact information:   [address] [place] [address] [phone]       Discharge Plan:  discharge to home  Disposition:     Signed: Benjiman Core for Dr. Melina Schools [facility]  Orthopaedics [phone] 04/22/2014, 11:09 AM

## 2014-04-22 NOTE — Discharge Summary (Signed)
Agree with above 

## 2014-06-10 ENCOUNTER — Ambulatory Visit (HOSPITAL_COMMUNITY)
Admission: RE | Admit: 2014-06-10 | Discharge: 2014-06-10 | Disposition: A | Discharge status: Home / Self Care | Payer: Medicare Other | Source: Ambulatory Visit | Attending: Surgery | Admitting: Surgery

## 2014-06-10 DIAGNOSIS — M6281 Muscle weakness (generalized): Secondary | ICD-10-CM | POA: Insufficient documentation

## 2014-06-10 DIAGNOSIS — Z951 Presence of aortocoronary bypass graft: Secondary | ICD-10-CM | POA: Insufficient documentation

## 2014-06-10 DIAGNOSIS — E669 Obesity, unspecified: Secondary | ICD-10-CM | POA: Insufficient documentation

## 2014-06-10 DIAGNOSIS — M539 Dorsopathy, unspecified: Secondary | ICD-10-CM | POA: Insufficient documentation

## 2014-06-10 DIAGNOSIS — R29898 Other symptoms and signs involving the musculoskeletal system: Secondary | ICD-10-CM | POA: Insufficient documentation

## 2014-06-10 DIAGNOSIS — R262 Difficulty in walking, not elsewhere classified: Secondary | ICD-10-CM | POA: Insufficient documentation

## 2014-06-10 DIAGNOSIS — M545 Low back pain, unspecified: Secondary | ICD-10-CM | POA: Insufficient documentation

## 2014-06-10 DIAGNOSIS — Z6830 Body mass index (BMI) 30.0-30.9, adult: Secondary | ICD-10-CM | POA: Insufficient documentation

## 2014-06-10 DIAGNOSIS — M549 Dorsalgia, unspecified: Secondary | ICD-10-CM | POA: Diagnosis present

## 2014-06-10 DIAGNOSIS — N184 Chronic kidney disease, stage 4 (severe): Secondary | ICD-10-CM | POA: Insufficient documentation

## 2014-06-10 DIAGNOSIS — I129 Hypertensive chronic kidney disease with stage 1 through stage 4 chronic kidney disease, or unspecified chronic kidney disease: Secondary | ICD-10-CM | POA: Insufficient documentation

## 2014-06-10 DIAGNOSIS — E119 Type 2 diabetes mellitus without complications: Secondary | ICD-10-CM | POA: Insufficient documentation

## 2014-06-10 DIAGNOSIS — IMO0001 Reserved for inherently not codable concepts without codable children: Secondary | ICD-10-CM | POA: Insufficient documentation

## 2014-06-10 NOTE — Evaluation (Signed)
Physical Therapy Evaluation  Patient Details  Name: Allen Medina MRN: [id number] Date of Birth: [birth year]  Today's Date: 06/10/2014 Time: 1345-1430 PT Time Calculation (min): 45 min Charge:  evaluation             Visit#: 1 of 12  Re-eval: 07/10/14 Assessment Diagnosis: lumbar fursion Surgical Date: 04/16/14 Next MD Visit: 07/08/2014 Prior Therapy: HH  Authorization: [employer] medicare     Authorization Visit#: 1 of 10   Past Medical History:  Past Medical History  Diagnosis Date  . S/P CABG x 4 2000    SVG-OM1, SVG-OM 2-OM 3, SVG-diagonal, free RIMA-RPDA ( has stent in the vessel from 2003)-  . CAD, multiple vessel 1991    last cath 2006-patent grafts, last nuc 09/10/09-no ischemia  . CAD (coronary artery disease) of bypass graft 2000    Occluded grafts --> redo CABG  . Peripheral vascular disease     peripheral neuropathy, claudication  . Claudication     LE dopplers 03/17/10, normal ABIs, normal pressures  . HTN (hypertension)     difficult to control  . Hyperlipidemia LDL goal < 100   . Spinal stenosis   . Vitamin B 12 deficiency   . Neuropathy in diabetes   . History of blood transfusion 2010    "after back OR" (04/16/2014)  . Pneumonia     hx of  . Thin skin   . Blindness of left eye   . Myocardial infarction 2000    LOV with EKG Dr Rex Kras 7/12 on chart, eccho 10/03, stress test 9/10 on chart, chest x ray 2/13 EP[facility], chest CT 02/13/12 on chart  . S/P CABG x 5     only LIMA-LAD still patent  . Diabetes mellitus type 2 with complications     renal insuff, HTN, CAD, PVD  . Arthritis     "hands" (04/16/2014)  . Chronic lower back pain   . CKD (chronic kidney disease) stage 4, GFR 15-29 ml/min     kidney stones,cmet 02/09/12, 02/13/12 on chart  . Kidney stones    Past Surgical History:  Past Surgical History  Procedure Laterality Date  . Redo - coronary artery bypass graft  2000     previous LIMA to LAD patent; seqSVG - OM 2, OM 3, SVG-D3, SVG-OM1, fRIMA-rPDA  .  Cystoscopy/retrograde/ureteroscopy/stone extraction with basket  02/16/2012    Procedure: CYSTOSCOPY/RETROGRADE/URETEROSCOPY/STONE EXTRACTION WITH BASKET;  Surgeon: Malka So, MD;  Location: [facility];  Service: Urology;  Laterality: Left;  Left Ureterscopy with Stone Extraction  . Cardiac catheterization  06/04/1990    severe 3 vessel dz  . Cardiac catheterization  01/13/97    patent grafts, nl LV function  . Cardiac catheterization  01/28/1999    severe native CAD with disease involving all 3 SVG, recommend redo CABG  . Cardiac catheterization  05/16/2002    patent graft except of mod to sever stenosis in the midprotion of the RIMA to the posterior descending artery  . Coronary angioplasty with stent placement  05/22/2002    3.0x12mm cypher stent covered the proximal and distal portion of RIMA, final inflation -16x60  . Cardiac catheterization  08/28/2003    nl lv fxn, patent grafts to all vessels with small vessel disease  . Cardiac catheterization  07/07/2005    patent grafts, EF 45-50%  . Doppler echocardiography  07/09/2013    EF 65-78 (up from 50-55% in 2013), minimally increased transvalvular velocity across the aortic valve suggestive of very mild stenosis to  simply sclerosis. Normal diastolic indices  . Back surgery  2009, 2010  . Nm myoview ltd  02/2013    no evidence of ischemia or infarct, septal hypokinesis/dyskinesis   . Coronary artery bypass graft  1991 & 2000    only LIMA-LAD patent  . Tonsillectomy    . Cholecystectomy    . Lateral fusion lumbar spine  04/16/2014  . Carpal tunnel release Right   . Cataract extraction w/ intraocular lens  implant, bilateral    . Cystoscopy w/ stone manipulation  2013  . Anterior lat lumbar fusion N/A 04/16/2014    Procedure: Lateral  Lumbar Two-Three/Three-Four Interbody and Fusion with Lateral Pedicle Screw Fixation Lumbar Two-Four;  Surgeon: Melina Schools, MD;  Location: [facility];  Service: Orthopedics;  Laterality: N/A;     Subjective Symptoms/Limitations Symptoms: Mr. Week states that he had a fusion on 04/16/2014 for his low back.  He states that he feels 70% improved since the operation. He is still having difficulty dressing and prolong walking and he feels his balance has been affected.  He is wearing his back brace all the time.  Pertinent History: Pt has had two previous surgeries.  How long can you sit comfortably?: no problem How long can you stand comfortably?: less than five minutes  How long can you walk comfortably?: less than ten minutes.  Patient Stated Goals: Pt states he is scared to increase his activity. Pain Assessment Currently in Pain?: Yes Pain Score: 2  (with pain medication;  highest pain in the past week has been an 8/10) Pain Location: Back Pain Orientation: Left Pain Type: Surgical pain;Chronic pain Pain Radiating Towards: into upper thigh Pain Onset: More than a month ago Pain Frequency: Constant Pain Relieving Factors: medication  Effect of Pain on Daily Activities: increase.     Balance Screening Balance Screen Has the patient fallen in the past 6 months: No  Prior Functioning  Prior Function Leisure: Hobbies-yes (Comment) Comments: camping,     Sensation/Coordination/Flexibility/Functional Tests Functional Tests Functional Tests: foto 31  Assessment RLE Strength Right Hip Flexion: 5/5 Right Hip Extension: 4/5 Right Hip ABduction: 4/5 Right Knee Flexion: 5/5 Right Knee Extension: 5/5 Right Ankle Dorsiflexion: 5/5 LLE Strength Left Hip Flexion: 4/5 Left Hip Extension: 4/5 Left Hip ABduction: 3+/5 Left Knee Flexion: 4/5 Left Knee Extension: 3+/5 Left Ankle Dorsiflexion: 4/5 Lumbar AROM Lumbar Extension: decreased 80% Lumbar - Right Side Bend: decreased 20% Lumbar - Left Side Bend: decreased  50%  Exercise/Treatments Mobility/Balance  Posture/Postural Control Posture/Postural Control: Postural limitations Postural Limitations: flat back;  protruding abdominal mm   Stretches Single Knee to Chest Stretch: 3 reps;30 seconds   Supine Ab Set: 10 reps Bent Knee Raise: 10 reps Bridge: 10 reps    Physical Therapy Assessment and Plan PT Assessment and Plan Clinical Impression Statement: Pt is a 73 yo who had a lumbar fusion in April.  The patient is very reluctent to increase his physical actrivity due to fear.  Exam demonstrates stiffness, decreased LE strength and poor core strengh.  The patient is referred for and will benefit from skilled therapy to address these issues and return the patient to his maximal physcial functioning level. Pt will benefit from skilled therapeutic intervention in order to improve on the following deficits: Decreased activity tolerance;Decreased balance;Pain;Decreased range of motion;Decreased strength;Impaired flexibility;Difficulty walking;Improper body mechanics Rehab Potential: Good PT Frequency: Min 3X/week PT Duration: 4 weeks PT Treatment/Interventions: Therapeutic activities;Therapeutic exercise;Balance training;Patient/family education;Manual techniques PT Plan: Pt to begin floating SLR(begin with knees  bent slowly straighten then small up and down with back stabilized), isometric flexion, clam, heel squeeze, standing heel raise and functional squat.  Encourage pt to begin a walking program beginning at five minutes and increasing until he is able to reach 30.     Goals Home Exercise Program Pt/caregiver will Perform Home Exercise Program: For increased ROM;For increased strengthening PT Goal: Perform Home Exercise Program - Progress: Goal set today PT Short Term Goals Time to Complete Short Term Goals: 2 weeks PT Short Term Goal 1: Pt to be able to tolerate sitting for 20 minutes to enjoy a quick meal. PT Short Term Goal 2: Pt to be able to tolerate standing for ten minutes for waiting in lines at stores  PT Short Term Goal 3: Pt to be walking at least 15 minutes 3 times a week PT Short  Term Goal 4: Pt to be wearing back brace only 50% of the day PT Long Term Goals Time to Complete Long Term Goals: 4 weeks PT Long Term Goal 1: Pain no greater than a 4 PT Long Term Goal 2: able to tolerate sitting for an hour in order to go our to eat Long Term Goal 3: ablle to tolerate standing for 20 mintues to socialize Long Term Goal 4: Pt to be walking 30 minutes three times a week for better health habits PT Long Term Goal 5: Pt to be able to dress self with ease.   Problem List Patient Active Problem List   Diagnosis Date Noted  . Difficulty in walking(719.7) 06/10/2014  . Weakness of left leg 06/10/2014  . Back pain 04/16/2014  . Preoperative cardiovascular examination 03/15/2014  . Pre-operative cardiovascular examination 03/15/2014  . CAD, multiple vessel; native LAD and circumflex 100% occluded; distal RCA severely diseased with possible PL or RPDA occluded 06/30/2013  . Obesity (BMI 30.0-34.9) 06/30/2013  . Peripheral vascular disease   . HTN (hypertension)   . 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 06/26/2013  . Diabetes 03/17/2013  . CAD (coronary artery disease) of artery bypass graft - only LIMA-LAD patent from CABG in 91; rredo CABG 2000   . CKD (chronic kidney disease) stage 4, GFR 15-29 ml/min   . Hyperlipidemia LDL goal < 100   . Spinal stenosis   . Vitamin B 12 deficiency     PT Plan of Care PT Home Exercise Plan: given  GP Functional Assessment Tool Used: foto  Functional Limitation: Carrying, moving and handling objects Carrying, Moving and Handling Objects Current Status [id number]): At least 60 percent but less than 80 percent impaired, limited or restricted Carrying, Moving and Handling Objects Goal Status [phone]): At least 40 percent but less than 60 percent impaired, limited or restricted  RUSSELL,CINDY 06/10/2014, 4:38 PM  Physician Documentation Your signature is required to indicate approval of the treatment plan as stated above.  Please  sign and either send electronically or make a copy of this report for your files and return this physician signed original.   Please mark one 1.__approve of plan  2. ___approve of plan with the following conditions.   ______________________________                                                          _____________________ Physician Signature  Date  

## 2014-06-12 ENCOUNTER — Ambulatory Visit (HOSPITAL_COMMUNITY)
Admission: RE | Admit: 2014-06-12 | Discharge: 2014-06-12 | Disposition: A | Discharge status: Home / Self Care | Payer: Medicare Other | Source: Ambulatory Visit | Attending: Internal Medicine | Admitting: Internal Medicine

## 2014-06-12 NOTE — Progress Notes (Signed)
Physical Therapy Treatment Patient Details  Name: Allen Medina MRN: [id number] Date of Birth: Jan 11, 1941  Today's Date: 06/12/2014 Time: [id number] PT Time Calculation (min): 42 min  TE [id number] Visit#: 2 of 12  Re-eval: 07/10/14    Authorization: [employer] medicare    Subjective: Symptoms/Limitations Symptoms: Pt reports current pain level 3/10.  He reports he has been using the heated seats in the car, though has not been using a hot/cold pack at home.  He reports he took his pain pill last night before bed, though hasn't felt like he has needed one today.   Pt presents without back brace, and reports he has been wearing it ~50% of the day as needed.   Pain Assessment Currently in Pain?: Yes Pain Score: 3  Pain Location: Back Pain Orientation: Lower Pain Type: Surgical pain   Exercise/Treatments Stretches Active Hamstring Stretch: 3 reps;30 seconds;Limitations Active Hamstring Stretch Limitations: Standing, 14" box Single Knee to Chest Stretch: 3 reps;30 seconds Piriformis Stretch: 3 reps;30 seconds;Limitations Piriformis Stretch Limitations: Supine Stretch Standing Heel Raises: 20 reps (2x10) Functional Squats: 10 reps (2x5) Other Standing Lumbar Exercises: Gastroc Stretch, Slantboard, 30" x3 Supine Ab Set: 5 seconds;10 reps Bent Knee Raise: 20 reps (2x10) Bridge: 20 reps (2x10) Sidelying Clam: 20 reps;Limitations Clam Limitations: RTB, both sides  Physical Therapy Assessment and Plan PT Assessment and Plan Clinical Impression Statement: Pt presents to PT with decreased fear of movement, with reports of decreasing medication use and wearing back brace.  Noted some stiffness with gait (decreased pelvic rotation, decreased arm swing) after initially standing up from chair, which pt reports is normal after inactivity for any length of time.  Initiated exercise program with stretches, with progression into strengthening exercises.  VC required for correct mechanics with  pelvic tilting/bracing prior to core stabilization exercises to increase stability and protect back.  Pt tolerated low level exercises well, with limited rest breaks.  No complaints of pain with activity, with reports of no pain at end of treatment session.  Educated pt on possible use of ice pack tonight if soreness into back develops as this is the first full treatment session with PT since surgery.    Pt will benefit from skilled therapeutic intervention in order to improve on the following deficits: Decreased activity tolerance;Decreased balance;Pain;Decreased range of motion;Decreased strength;Impaired flexibility;Difficulty walking;Improper body mechanics Rehab Potential: Good PT Frequency: Min 3X/week PT Duration: 4 weeks PT Treatment/Interventions: Therapeutic activities;Therapeutic exercise;Balance training;Patient/family education;Manual techniques PT Plan: Progress exercises to tolerance.  Develop sheet for HEP for patient to continue at home.      Problem List Patient Active Problem List   Diagnosis Date Noted  . Difficulty in walking(719.7) 06/10/2014  . Weakness of left leg 06/10/2014  . Back pain 04/16/2014  . Preoperative cardiovascular examination 03/15/2014  . Pre-operative cardiovascular examination 03/15/2014  . CAD, multiple vessel; native LAD and circumflex 100% occluded; distal RCA severely diseased with possible PL or RPDA occluded 06/30/2013  . Obesity (BMI 30.0-34.9) 06/30/2013  . Peripheral vascular disease   . HTN (hypertension)   . 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 06/26/2013  . Diabetes 03/17/2013  . CAD (coronary artery disease) of artery bypass graft - only LIMA-LAD patent from CABG in 91; rredo CABG 2000   . CKD (chronic kidney disease) stage 4, GFR 15-29 ml/min   . Hyperlipidemia LDL goal < 100   . Spinal stenosis   . Vitamin B 12 deficiency     PT - End of Session Activity Tolerance:  Patient tolerated treatment  well   WOODWORTH,STEPHANIE 06/12/2014, 2:37 PM

## 2014-06-17 ENCOUNTER — Ambulatory Visit (HOSPITAL_COMMUNITY)
Admission: RE | Admit: 2014-06-17 | Discharge: 2014-06-17 | Disposition: A | Discharge status: Home / Self Care | Payer: Medicare Other | Source: Ambulatory Visit | Attending: Internal Medicine | Admitting: Internal Medicine

## 2014-06-17 DIAGNOSIS — R29898 Other symptoms and signs involving the musculoskeletal system: Secondary | ICD-10-CM

## 2014-06-17 DIAGNOSIS — R262 Difficulty in walking, not elsewhere classified: Secondary | ICD-10-CM

## 2014-06-17 NOTE — Progress Notes (Signed)
Physical Therapy Treatment Patient Details  Name: Allen Medina MRN: [id number] Date of Birth: 07/05/1941  Today's Date: 06/17/2014 Time: 0800-0847 PT Time Calculation (min): 47 min Charge: TE [id number]  Visit#: 3 of 12  Re-eval: 07/10/14 Assessment Diagnosis: lumbar fursion Surgical Date: 04/16/14 Next MD Visit: Rolena Infante 07/08/2014 Prior Therapy: HH  Authorization: [employer] medicare   Authorization Time Period:    Authorization Visit#: 3 of 10   Subjective: Symptoms/Limitations Symptoms: Current low back pain scale 5-6/10, pt reports it is better in morning.  Reports compliance with HEP without question for any exercise. Pain Assessment Currently in Pain?: Yes Pain Score: 5  Pain Location: Back Pain Orientation: Lower  Precautions/Restrictions  Precautions Precautions: Back Precaution Comments: No Bending, lifting, twisting Required Braces or Orthoses: Other Brace/Splint  Exercise/Treatments Stretches Active Hamstring Stretch: 3 reps;30 seconds;Limitations Active Hamstring Stretch Limitations: supine with rope Single Knee to Chest Stretch: 3 reps;30 seconds Piriformis Stretch: 3 reps;30 seconds;Limitations Piriformis Stretch Limitations: Supine Stretch Standing Heel Raises: 20 reps Functional Squats: 10 reps Other Standing Lumbar Exercises: Gastroc Stretch, Slantboard, 30" x3 Supine Ab Set: 10 reps;5 seconds Bent Knee Raise: 20 reps Bridge: 20 reps Isometric Hip Flexion: 10 reps;5 seconds Sidelying Clam: 20 reps;Limitations Clam Limitations: RTB, both sides Hip Abduction: 10 reps Prone  Straight Leg Raise: 10 reps     Physical Therapy Assessment and Plan PT Assessment and Plan Clinical Impression Statement: Added weight shifting to improve hip mobility with gait.  Therapist facilitation required with therex for stabilty with exercises.  Reviewed walking program and HEP exercises for compliance and questions.  Noted limited pelvic mobility especially tight Lt  piriformis, pt given HEP for LE stretches to improve flexibilty.  No reports of pain through session.   PT Plan: Continue wiht current POC, begin prone heel squeeze and comtinue progressing stabilty as tolerated.    Goals PT Short Term Goals PT Short Term Goal 1: Pt to be able to tolerate sitting for 20 minutes to enjoy a quick meal. PT Short Term Goal 1 - Progress: Progressing toward goal PT Short Term Goal 2: Pt to be able to tolerate standing for ten minutes for waiting in lines at stores  PT Short Term Goal 2 - Progress: Progressing toward goal PT Short Term Goal 3: Pt to be walking at least 15 minutes 3 times a week PT Short Term Goal 3 - Progress: Progressing toward goal PT Short Term Goal 4: Pt to be wearing back brace only 50% of the day PT Short Term Goal 4 - Progress: Progressing toward goal  Problem List Patient Active Problem List   Diagnosis Date Noted  . Difficulty in walking(719.7) 06/10/2014  . Weakness of left leg 06/10/2014  . Back pain 04/16/2014  . Preoperative cardiovascular examination 03/15/2014  . Pre-operative cardiovascular examination 03/15/2014  . CAD, multiple vessel; native LAD and circumflex 100% occluded; distal RCA severely diseased with possible PL or RPDA occluded 06/30/2013  . Obesity (BMI 30.0-34.9) 06/30/2013  . Peripheral vascular disease   . HTN (hypertension)   . 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 06/26/2013  . Diabetes 03/17/2013  . CAD (coronary artery disease) of artery bypass graft - only LIMA-LAD patent from CABG in 91; rredo CABG 2000   . CKD (chronic kidney disease) stage 4, GFR 15-29 ml/min   . Hyperlipidemia LDL goal < 100   . Spinal stenosis   . Vitamin B 12 deficiency     PT - End of Session Activity Tolerance: Patient tolerated treatment  well General Behavior During Therapy: [facility] for tasks assessed/performed  GP    Aldona Lento 06/17/2014, 9:12 AM

## 2014-06-19 ENCOUNTER — Telehealth (HOSPITAL_COMMUNITY): Payer: Self-pay

## 2014-06-19 ENCOUNTER — Ambulatory Visit (HOSPITAL_COMMUNITY)
Admission: RE | Admit: 2014-06-19 | Discharge: 2014-06-19 | Disposition: A | Discharge status: Home / Self Care | Payer: Medicare Other | Source: Ambulatory Visit | Attending: Surgery | Admitting: Surgery

## 2014-06-19 DIAGNOSIS — R29898 Other symptoms and signs involving the musculoskeletal system: Secondary | ICD-10-CM

## 2014-06-19 DIAGNOSIS — E669 Obesity, unspecified: Secondary | ICD-10-CM | POA: Diagnosis not present

## 2014-06-19 DIAGNOSIS — M545 Low back pain, unspecified: Secondary | ICD-10-CM | POA: Diagnosis not present

## 2014-06-19 DIAGNOSIS — M6281 Muscle weakness (generalized): Secondary | ICD-10-CM | POA: Diagnosis not present

## 2014-06-19 DIAGNOSIS — N184 Chronic kidney disease, stage 4 (severe): Secondary | ICD-10-CM | POA: Diagnosis not present

## 2014-06-19 DIAGNOSIS — Z951 Presence of aortocoronary bypass graft: Secondary | ICD-10-CM | POA: Diagnosis not present

## 2014-06-19 DIAGNOSIS — M539 Dorsopathy, unspecified: Secondary | ICD-10-CM | POA: Insufficient documentation

## 2014-06-19 DIAGNOSIS — Z6830 Body mass index (BMI) 30.0-30.9, adult: Secondary | ICD-10-CM | POA: Diagnosis not present

## 2014-06-19 DIAGNOSIS — IMO0001 Reserved for inherently not codable concepts without codable children: Secondary | ICD-10-CM | POA: Insufficient documentation

## 2014-06-19 DIAGNOSIS — E119 Type 2 diabetes mellitus without complications: Secondary | ICD-10-CM | POA: Insufficient documentation

## 2014-06-19 DIAGNOSIS — R262 Difficulty in walking, not elsewhere classified: Secondary | ICD-10-CM

## 2014-06-19 DIAGNOSIS — I129 Hypertensive chronic kidney disease with stage 1 through stage 4 chronic kidney disease, or unspecified chronic kidney disease: Secondary | ICD-10-CM | POA: Diagnosis not present

## 2014-06-19 NOTE — Progress Notes (Signed)
Physical Therapy Treatment Patient Details  Name: KHALEED HOLAN MRN: [id number] Date of Birth: 1941/03/07  Today's Date: 06/19/2014 Time: 3710-6269 PT Time Calculation (min): 44 min Charge: TE [id number]   Visit#: 4 of 12  Re-eval: 07/10/14 Assessment Diagnosis: lumbar fursion Surgical Date: 04/16/14 Next MD Visit: Rolena Infante 07/08/2014 Prior Therapy: HH  Authorization: [employer] medicare   Authorization Time Period:    Authorization Visit#: 4 of 10   Subjective: Symptoms/Limitations Symptoms: Pt reported increased low back pain following last session, stated rope used with hamstring stretch hurt arch of Lt foot.  Current pain scale 4/10 Pain Assessment Currently in Pain?: Yes Pain Score: 4  Pain Location: Back Pain Orientation: Lower  Precautions/Restrictions  Precautions Precautions: Back Precaution Comments: No Bending, lifting, twisting Required Braces or Orthoses: Other Brace/Splint  Exercise/Treatments Stretches Active Hamstring Stretch: 3 reps;30 seconds;Limitations Active Hamstring Stretch Limitations: standing 14in step Hip Flexor Stretch: 2 reps;30 seconds;Limitations Hip Flexor Stretch Limitations: on 14 in box Prone on Elbows Stretch: 2 reps;60 seconds Piriformis Stretch: 2 reps;30 seconds;Limitations Piriformis Stretch Limitations: seated and supine with manual assistance Standing Heel Raises: 20 reps;Limitations Heel Raises Limitations: Toe raises Functional Squats: 15 reps Other Standing Lumbar Exercises: Gastroc Stretch, Slantboard, 30" x3 Other Standing Lumbar Exercises: Weight shifting 10x Supine Ab Set: 10 reps;5 seconds Bent Knee Raise: 20 reps Isometric Hip Flexion: 10 reps;5 seconds Prone  Straight Leg Raise: 10 reps Other Prone Lumbar Exercises: heel squeeze 5x 5     Physical Therapy Assessment and Plan PT Assessment and Plan Clinical Impression Statement: Noted decreased stance on Lt LE with standing, weight shifting activities to improve  hip mobility with gait.  Session focus on improving mobility with stretches to reduce pain and core strengthening.  Added hip flexion stretch to POC and began standing hamstring stretch following report of arch pain with supine hamstirng stretch with rope.  Piriformis stretch complete in supine and seated with manual assistance, Lt >Rt piriformis.  Therapist faciliation to improve stabilty with core strengthening exercises.  Began prone heel squeeze for gluteal strengthenig PT Plan: Continue wiht current POC, progress stability as tolerated.    Goals PT Short Term Goals PT Short Term Goal 1: Pt to be able to tolerate sitting for 20 minutes to enjoy a quick meal. PT Short Term Goal 1 - Progress: Progressing toward goal PT Short Term Goal 2: Pt to be able to tolerate standing for ten minutes for waiting in lines at stores  PT Short Term Goal 2 - Progress: Progressing toward goal PT Short Term Goal 3: Pt to be walking at least 15 minutes 3 times a week PT Short Term Goal 3 - Progress: Progressing toward goal PT Short Term Goal 4: Pt to be wearing back brace only 50% of the day PT Short Term Goal 4 - Progress: Met  Problem List Patient Active Problem List   Diagnosis Date Noted  . Difficulty in walking(719.7) 06/10/2014  . Weakness of left leg 06/10/2014  . Back pain 04/16/2014  . Preoperative cardiovascular examination 03/15/2014  . Pre-operative cardiovascular examination 03/15/2014  . CAD, multiple vessel; native LAD and circumflex 100% occluded; distal RCA severely diseased with possible PL or RPDA occluded 06/30/2013  . Obesity (BMI 30.0-34.9) 06/30/2013  . Peripheral vascular disease   . HTN (hypertension)   . 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 06/26/2013  . Diabetes 03/17/2013  . CAD (coronary artery disease) of artery bypass graft - only LIMA-LAD patent from CABG in 91; rredo CABG 2000   .  CKD (chronic kidney disease) stage 4, GFR 15-29 ml/min   . Hyperlipidemia LDL  goal < 100   . Spinal stenosis   . Vitamin B 12 deficiency     PT - End of Session Activity Tolerance: Patient tolerated treatment well General Behavior During Therapy: [facility] for tasks assessed/performed  GP    Aldona Lento 06/19/2014, 9:40 AM

## 2014-06-23 ENCOUNTER — Ambulatory Visit (HOSPITAL_COMMUNITY): Payer: Medicare Other

## 2014-06-25 ENCOUNTER — Ambulatory Visit (HOSPITAL_COMMUNITY): Payer: Medicare Other | Admitting: Physical Therapy

## 2014-06-27 ENCOUNTER — Ambulatory Visit (HOSPITAL_COMMUNITY): Payer: Medicare Other

## 2014-06-30 ENCOUNTER — Ambulatory Visit (HOSPITAL_COMMUNITY): Payer: Medicare Other | Admitting: Physical Therapy

## 2014-07-01 ENCOUNTER — Ambulatory Visit (INDEPENDENT_AMBULATORY_CARE_PROVIDER_SITE_OTHER): Payer: Medicare Other

## 2014-07-01 VITALS — BP 146/59 | HR 65 | Resp 16 | Ht 66.0 in | Wt 215.0 lb

## 2014-07-01 DIAGNOSIS — M79606 Pain in leg, unspecified: Secondary | ICD-10-CM

## 2014-07-01 DIAGNOSIS — M79609 Pain in unspecified limb: Secondary | ICD-10-CM

## 2014-07-01 DIAGNOSIS — E1142 Type 2 diabetes mellitus with diabetic polyneuropathy: Secondary | ICD-10-CM

## 2014-07-01 DIAGNOSIS — E1140 Type 2 diabetes mellitus with diabetic neuropathy, unspecified: Secondary | ICD-10-CM

## 2014-07-01 DIAGNOSIS — E1149 Type 2 diabetes mellitus with other diabetic neurological complication: Secondary | ICD-10-CM

## 2014-07-01 DIAGNOSIS — B351 Tinea unguium: Secondary | ICD-10-CM

## 2014-07-01 NOTE — Patient Instructions (Signed)
Diabetes and Foot Care Diabetes may cause you to have problems because of poor blood supply (circulation) to your feet and legs. This may cause the skin on your feet to become thinner, break easier, and heal more slowly. Your skin may become dry, and the skin may peel and crack. You may also have nerve damage in your legs and feet causing decreased feeling in them. You may not notice minor injuries to your feet that could lead to infections or more serious problems. Taking care of your feet is one of the most important things you can do for yourself.  HOME CARE INSTRUCTIONS  Wear shoes at all times, even in the house. Do not go barefoot. Bare feet are easily injured.  Check your feet daily for blisters, cuts, and redness. If you cannot see the bottom of your feet, use a mirror or ask someone for help.  Wash your feet with warm water (do not use hot water) and mild soap. Then pat your feet and the areas between your toes until they are completely dry. Do not soak your feet as this can dry your skin.  Apply a moisturizing lotion or petroleum jelly (that does not contain alcohol and is unscented) to the skin on your feet and to dry, brittle toenails. Do not apply lotion between your toes.  Trim your toenails straight across. Do not dig under them or around the cuticle. File the edges of your nails with an emery board or nail file.  Do not cut corns or calluses or try to remove them with medicine.  Wear clean socks or stockings every day. Make sure they are not too tight. Do not wear knee-high stockings since they may decrease blood flow to your legs.  Wear shoes that fit properly and have enough cushioning. To break in new shoes, wear them for just a few hours a day. This prevents you from injuring your feet. Always look in your shoes before you put them on to be sure there are no objects inside.  Do not cross your legs. This may decrease the blood flow to your feet.  If you find a minor scrape,  cut, or break in the skin on your feet, keep it and the skin around it clean and dry. These areas may be cleansed with mild soap and water. Do not cleanse the area with peroxide, alcohol, or iodine.  When you remove an adhesive bandage, be sure not to damage the skin around it.  If you have a wound, look at it several times a day to make sure it is healing.  Do not use heating pads or hot water bottles. They may burn your skin. If you have lost feeling in your feet or legs, you may not know it is happening until it is too late.  Make sure your health care provider performs a complete foot exam at least annually or more often if you have foot problems. Report any cuts, sores, or bruises to your health care provider immediately. SEEK MEDICAL CARE IF:   You have an injury that is not healing.  You have cuts or breaks in the skin.  You have an ingrown nail.  You notice redness on your legs or feet.  You feel burning or tingling in your legs or feet.  You have pain or cramps in your legs and feet.  Your legs or feet are numb.  Your feet always feel cold. SEEK IMMEDIATE MEDICAL CARE IF:   There is increasing redness,   swelling, or pain in or around a wound.  There is a red line that goes up your leg.  Pus is coming from a wound.  You develop a fever or as directed by your health care provider.  You notice a bad smell coming from an ulcer or wound. Document Released: 12/02/2000 Document Revised: 08/07/2013 Document Reviewed: 05/14/2013 ExitCare Patient Information 2015 [employer]. This information is not intended to replace advice given to you by your health care provider. Make sure you discuss any questions you have with your health care provider.  

## 2014-07-01 NOTE — Progress Notes (Signed)
   Subjective:    Patient ID: Allen Medina, male    DOB: 12-03-1941, 73 y.o.   MRN: [id number]  HPI Comments: N toenail debridement L left 1 - 5 and right 2 - 5 toenails D and O long term C elongated, thick toenails A diabetes, difficult to cut T hx of debridement at [facility] and home pedicure     Review of Systems  Constitutional: Positive for unexpected weight change.  Cardiovascular: Positive for leg swelling.  Endocrine: Positive for cold intolerance.  Musculoskeletal: Positive for back pain and gait problem.  All other systems reviewed and are negative.      Objective:   Physical Exam 73 year old white male well-developed well-nourished oriented x3 presents at this time for followup and back surgery on 2 months ago is doing well is improving having had rod placed in the spine patient last A1c was 7.6 otherwise doing well with his diabetes does have paresthesia bring his feet walk a mile earlier today otherwise seems to be doing well his and his bypass surgery with donor sites for both legs being noted. Okay objective findings as follows vascular status is intact although diminished DP and PT pulses plus one over 4 bilateral capillary refill timed 3-4 seconds all digits epicritic and proprioceptive sensations diminished on Semmes Weinstein testing to forefoot digits and arch there is normal plantar response DTRs not elicited dermatologically skin color pigment normal hair growth absent nails criptotic friable brittle discolored 1 through 5 left 2 through 5 right has had previous nail excision of the right great toe with good success and healing sometime within the past year or so. Patient may be candidate for AP nail procedure on the left at some point in the future per patient request. 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 At this time debridement of thick brittle crumbly friable mycotic nails x9 is carried out may be K. for future nail excision of the left great toe if it becomes painful or symptomatic per patient request we'll followup as needed suggest 3 month followup for palliative nail care in the future.  Harriet Masson DPM

## 2014-07-02 ENCOUNTER — Ambulatory Visit (HOSPITAL_COMMUNITY): Payer: Medicare Other

## 2014-07-04 ENCOUNTER — Ambulatory Visit (HOSPITAL_COMMUNITY): Payer: Medicare Other | Admitting: Physical Therapy

## 2014-10-17 ENCOUNTER — Encounter (INDEPENDENT_AMBULATORY_CARE_PROVIDER_SITE_OTHER): Payer: Medicare Other | Admitting: Ophthalmology

## 2014-10-17 ENCOUNTER — Telehealth: Payer: Self-pay | Admitting: *Deleted

## 2014-10-17 DIAGNOSIS — H35033 Hypertensive retinopathy, bilateral: Secondary | ICD-10-CM | POA: Diagnosis not present

## 2014-10-17 DIAGNOSIS — I10 Essential (primary) hypertension: Secondary | ICD-10-CM | POA: Diagnosis not present

## 2014-10-17 DIAGNOSIS — E11359 Type 2 diabetes mellitus with proliferative diabetic retinopathy without macular edema: Secondary | ICD-10-CM | POA: Diagnosis not present

## 2014-10-17 DIAGNOSIS — E11319 Type 2 diabetes mellitus with unspecified diabetic retinopathy without macular edema: Secondary | ICD-10-CM

## 2014-10-17 NOTE — Telephone Encounter (Signed)
Patient brought in form for [facility] - CARDIOVASCULAR SECTION SECTION FORM  COMPLETED  AND GIVEN TO PATIENT.

## 2015-02-05 IMAGING — CR DG LUMBAR SPINE 2-3V
2 series · 2 of 2 positions shown · non-contrast
Comparison: DG LUMBAR SPINE 2-3 VIEWS dated 04/16/2014; DG LUMBAR
SPINE 2-3 VIEWS dated 04/08/2014

CLINICAL DATA: Postop lumbar surgery

EXAM:
LUMBAR SPINE - 2-3 VIEW

[t l-spine a.p.]
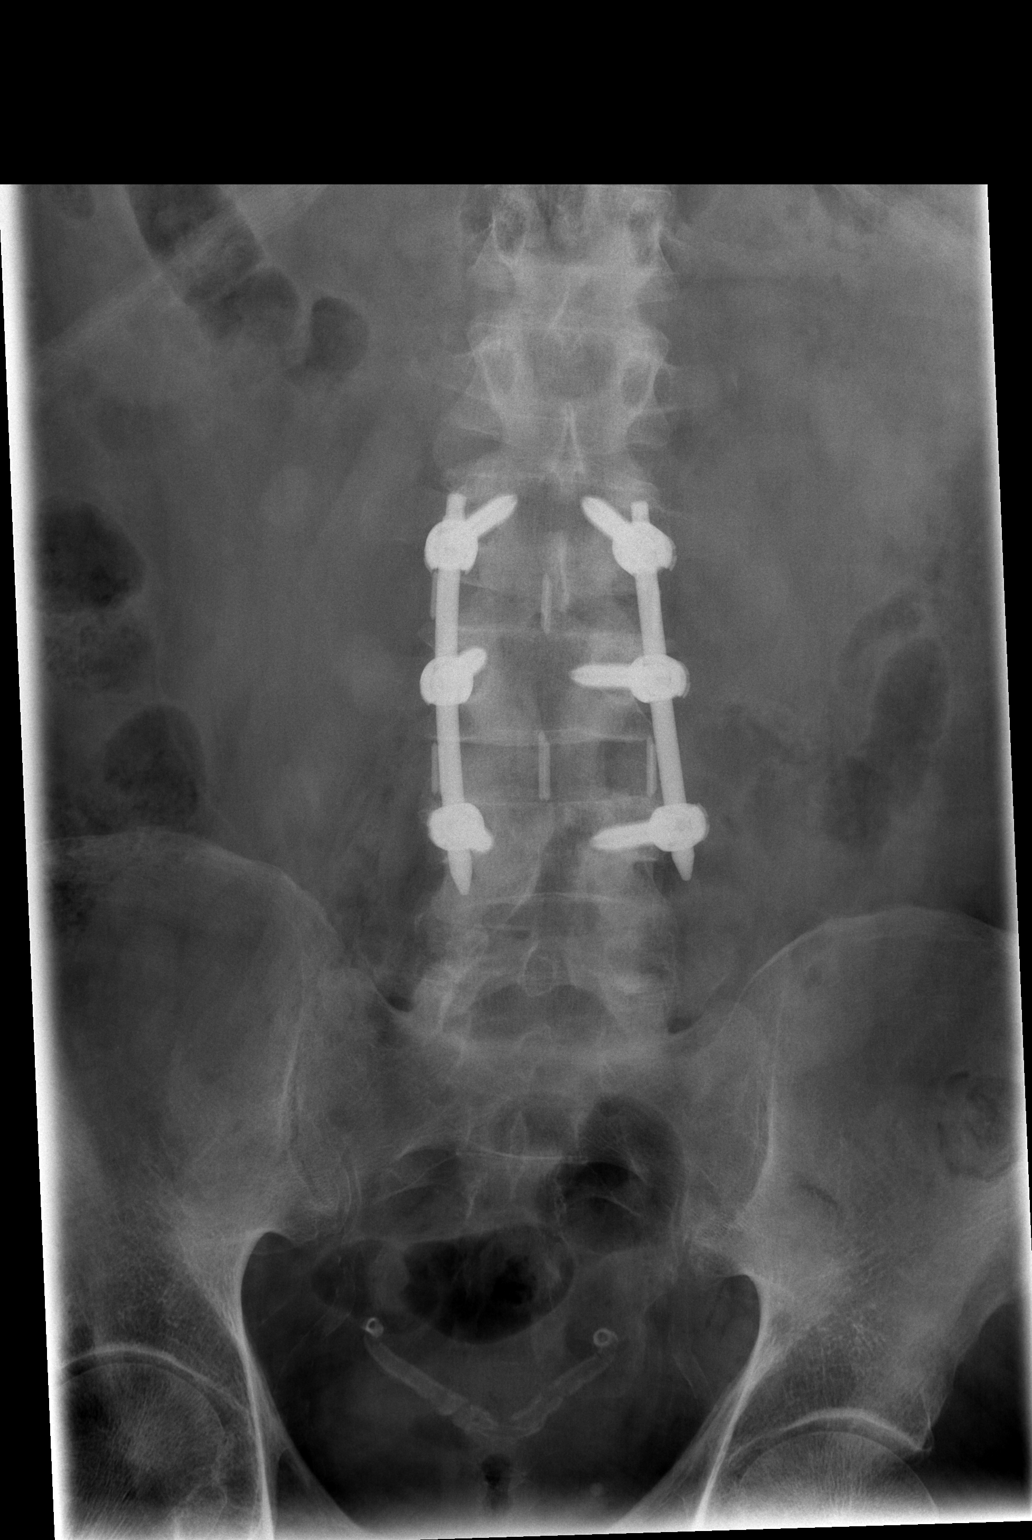

[t l-spine lat]
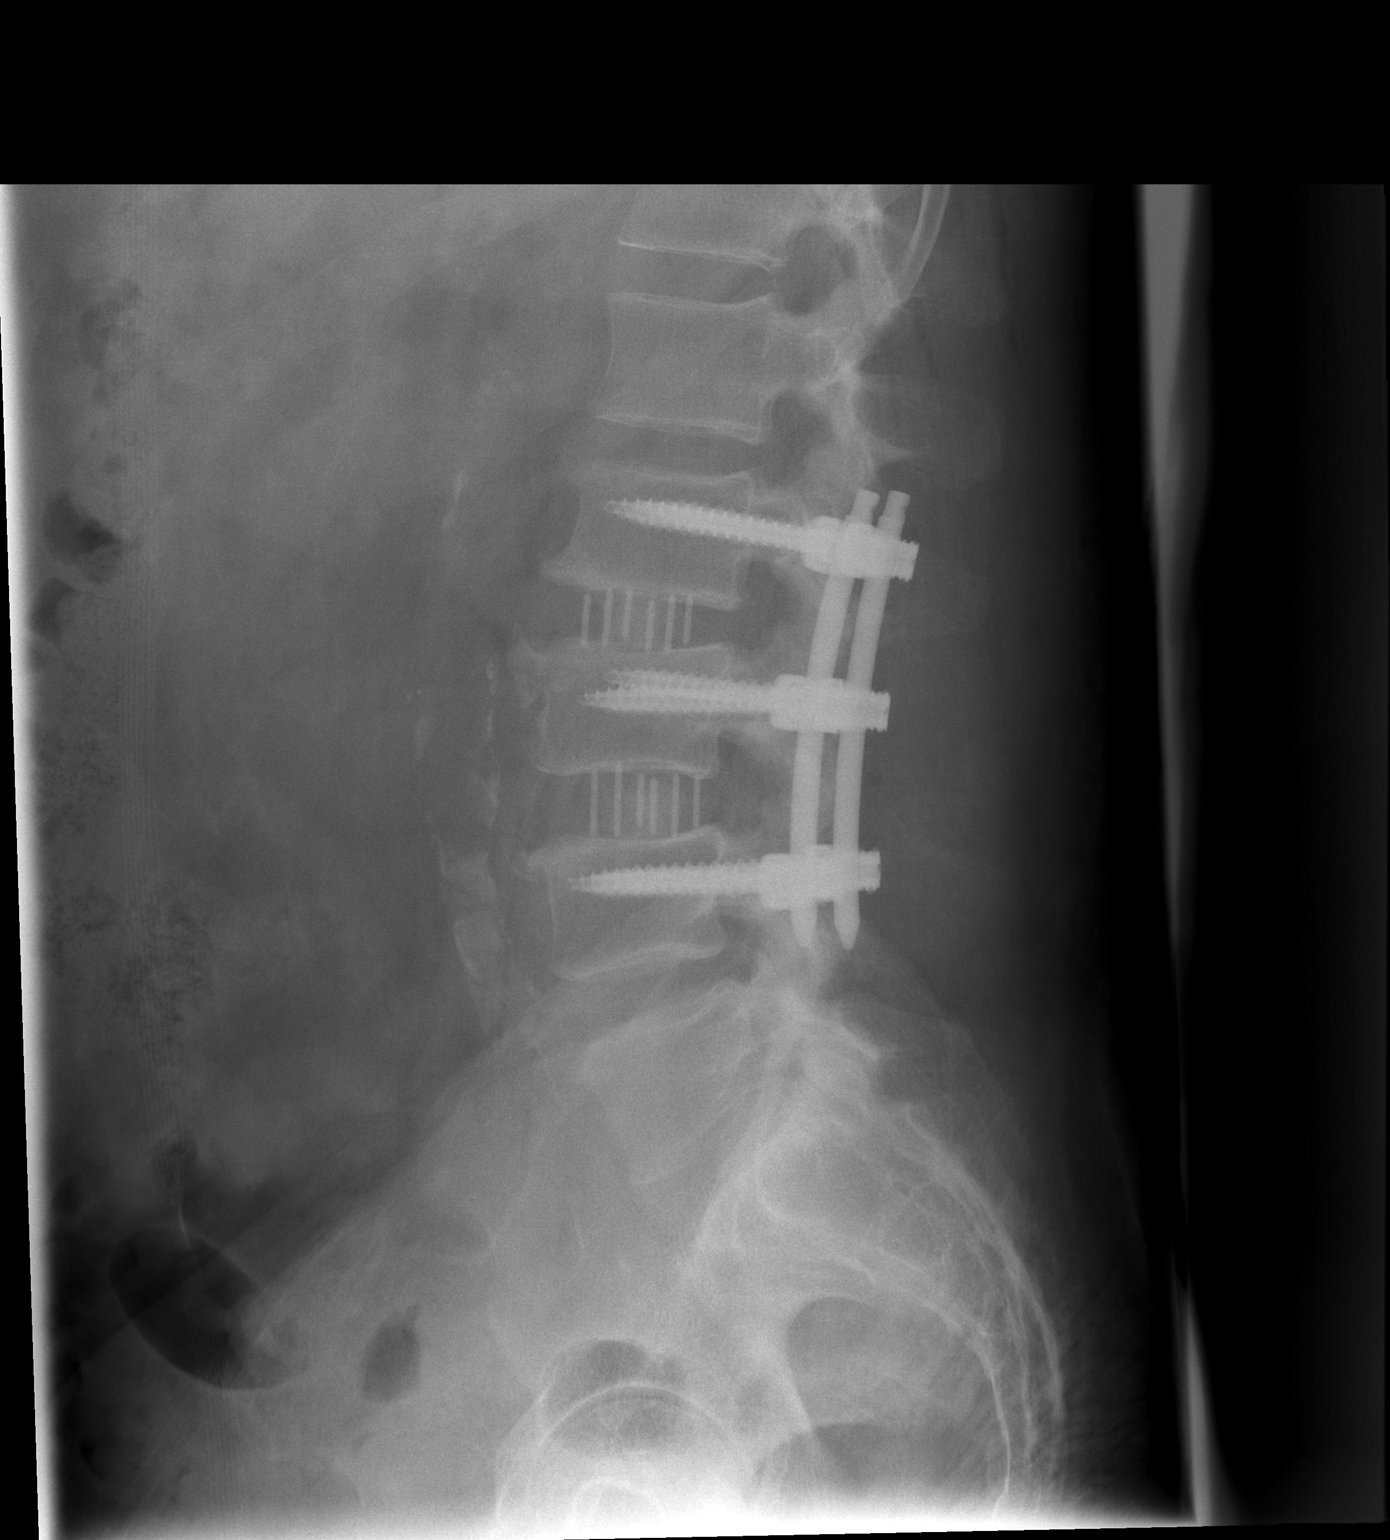

[2 of 2 positions shown; findings below may reference images not displayed]

FINDINGS: Posterior lumbar fusion from L2 through L4 with pedicle screws and
fusion rods. Interbody spacers noted. No evidence of subluxation.
Atherosclerotic disease of the aorta and vas deferens.
IMPRESSION: Posterior lumbar fusion from L2-L4 without complicating features.

## 2015-03-13 ENCOUNTER — Ambulatory Visit (INDEPENDENT_AMBULATORY_CARE_PROVIDER_SITE_OTHER): Payer: Medicare Other | Admitting: Ophthalmology

## 2015-05-05 ENCOUNTER — Encounter (INDEPENDENT_AMBULATORY_CARE_PROVIDER_SITE_OTHER): Payer: Medicare Other | Admitting: Ophthalmology

## 2015-05-07 ENCOUNTER — Other Ambulatory Visit (HOSPITAL_COMMUNITY)
Admission: RE | Admit: 2015-05-07 | Discharge: 2015-05-07 | Disposition: A | Discharge status: Home / Self Care | Payer: Medicare Other | Source: Ambulatory Visit | Attending: Orthopaedic Surgery | Admitting: Orthopaedic Surgery

## 2015-05-07 DIAGNOSIS — M65331 Trigger finger, right middle finger: Secondary | ICD-10-CM | POA: Diagnosis present

## 2015-05-11 ENCOUNTER — Encounter (INDEPENDENT_AMBULATORY_CARE_PROVIDER_SITE_OTHER): Payer: Medicare Other | Admitting: Ophthalmology

## 2015-05-11 DIAGNOSIS — E11319 Type 2 diabetes mellitus with unspecified diabetic retinopathy without macular edema: Secondary | ICD-10-CM | POA: Diagnosis not present

## 2015-05-11 DIAGNOSIS — E11359 Type 2 diabetes mellitus with proliferative diabetic retinopathy without macular edema: Secondary | ICD-10-CM

## 2015-05-11 DIAGNOSIS — I10 Essential (primary) hypertension: Secondary | ICD-10-CM

## 2015-05-11 DIAGNOSIS — H35033 Hypertensive retinopathy, bilateral: Secondary | ICD-10-CM

## 2015-06-15 ENCOUNTER — Other Ambulatory Visit: Payer: Self-pay

## 2016-05-10 ENCOUNTER — Ambulatory Visit (INDEPENDENT_AMBULATORY_CARE_PROVIDER_SITE_OTHER): Payer: Medicare Other | Admitting: Ophthalmology

## 2016-05-31 ENCOUNTER — Ambulatory Visit (INDEPENDENT_AMBULATORY_CARE_PROVIDER_SITE_OTHER): Payer: Medicare Other | Admitting: Ophthalmology

## 2016-05-31 DIAGNOSIS — E11319 Type 2 diabetes mellitus with unspecified diabetic retinopathy without macular edema: Secondary | ICD-10-CM | POA: Diagnosis not present

## 2016-05-31 DIAGNOSIS — H35033 Hypertensive retinopathy, bilateral: Secondary | ICD-10-CM

## 2016-05-31 DIAGNOSIS — I10 Essential (primary) hypertension: Secondary | ICD-10-CM | POA: Diagnosis not present

## 2016-05-31 DIAGNOSIS — E113593 Type 2 diabetes mellitus with proliferative diabetic retinopathy without macular edema, bilateral: Secondary | ICD-10-CM | POA: Diagnosis not present

## 2017-03-16 ENCOUNTER — Other Ambulatory Visit: Payer: Self-pay | Admitting: Gastroenterology

## 2017-03-16 DIAGNOSIS — R1310 Dysphagia, unspecified: Secondary | ICD-10-CM

## 2017-03-23 ENCOUNTER — Ambulatory Visit
Admission: RE | Admit: 2017-03-23 | Discharge: 2017-03-23 | Disposition: A | Discharge status: Home / Self Care | Payer: Medicare Other | Source: Ambulatory Visit | Attending: Gastroenterology | Admitting: Gastroenterology

## 2017-03-23 DIAGNOSIS — R1310 Dysphagia, unspecified: Secondary | ICD-10-CM

## 2017-03-30 ENCOUNTER — Other Ambulatory Visit: Payer: Self-pay | Admitting: Gastroenterology

## 2017-03-31 ENCOUNTER — Encounter (HOSPITAL_COMMUNITY)
Admission: RE | Disposition: A | Discharge status: Home / Self Care | Payer: Self-pay | Source: Ambulatory Visit | Attending: Gastroenterology

## 2017-03-31 ENCOUNTER — Ambulatory Visit (HOSPITAL_COMMUNITY)
Admission: RE | Admit: 2017-03-31 | Discharge: 2017-03-31 | Disposition: A | Discharge status: Home / Self Care | Payer: Medicare Other | Source: Ambulatory Visit | Attending: Gastroenterology | Admitting: Gastroenterology

## 2017-03-31 ENCOUNTER — Encounter (HOSPITAL_COMMUNITY): Payer: Self-pay | Admitting: *Deleted

## 2017-03-31 DIAGNOSIS — E1140 Type 2 diabetes mellitus with diabetic neuropathy, unspecified: Secondary | ICD-10-CM | POA: Insufficient documentation

## 2017-03-31 DIAGNOSIS — Z79899 Other long term (current) drug therapy: Secondary | ICD-10-CM | POA: Insufficient documentation

## 2017-03-31 DIAGNOSIS — E538 Deficiency of other specified B group vitamins: Secondary | ICD-10-CM | POA: Insufficient documentation

## 2017-03-31 DIAGNOSIS — I25810 Atherosclerosis of coronary artery bypass graft(s) without angina pectoris: Secondary | ICD-10-CM | POA: Diagnosis not present

## 2017-03-31 DIAGNOSIS — I252 Old myocardial infarction: Secondary | ICD-10-CM | POA: Diagnosis not present

## 2017-03-31 DIAGNOSIS — Z1211 Encounter for screening for malignant neoplasm of colon: Secondary | ICD-10-CM | POA: Diagnosis present

## 2017-03-31 DIAGNOSIS — D123 Benign neoplasm of transverse colon: Secondary | ICD-10-CM | POA: Diagnosis not present

## 2017-03-31 DIAGNOSIS — D125 Benign neoplasm of sigmoid colon: Secondary | ICD-10-CM | POA: Insufficient documentation

## 2017-03-31 DIAGNOSIS — E1122 Type 2 diabetes mellitus with diabetic chronic kidney disease: Secondary | ICD-10-CM | POA: Diagnosis not present

## 2017-03-31 DIAGNOSIS — I129 Hypertensive chronic kidney disease with stage 1 through stage 4 chronic kidney disease, or unspecified chronic kidney disease: Secondary | ICD-10-CM | POA: Insufficient documentation

## 2017-03-31 DIAGNOSIS — D122 Benign neoplasm of ascending colon: Secondary | ICD-10-CM | POA: Insufficient documentation

## 2017-03-31 DIAGNOSIS — M19042 Primary osteoarthritis, left hand: Secondary | ICD-10-CM | POA: Insufficient documentation

## 2017-03-31 DIAGNOSIS — E785 Hyperlipidemia, unspecified: Secondary | ICD-10-CM | POA: Insufficient documentation

## 2017-03-31 DIAGNOSIS — Z794 Long term (current) use of insulin: Secondary | ICD-10-CM | POA: Diagnosis not present

## 2017-03-31 DIAGNOSIS — N184 Chronic kidney disease, stage 4 (severe): Secondary | ICD-10-CM | POA: Diagnosis not present

## 2017-03-31 DIAGNOSIS — M19041 Primary osteoarthritis, right hand: Secondary | ICD-10-CM | POA: Diagnosis not present

## 2017-03-31 HISTORY — PX: COLONOSCOPY: SHX5424

## 2017-03-31 LAB — GLUCOSE, CAPILLARY
GLUCOSE-CAPILLARY: 87 mg/dL (ref 65–99)
Glucose-Capillary: 89 mg/dL (ref 65–99)

## 2017-03-31 SURGERY — COLONOSCOPY
Anesthesia: Moderate Sedation

## 2017-03-31 MED ORDER — MIDAZOLAM HCL 5 MG/5ML IJ SOLN
INTRAMUSCULAR | Status: DC | PRN
  Administered 2017-03-31 (×2): 2 mg via INTRAVENOUS

## 2017-03-31 MED ORDER — FENTANYL CITRATE (PF) 100 MCG/2ML IJ SOLN
INTRAMUSCULAR | Status: DC | PRN
  Administered 2017-03-31 (×2): 25 ug via INTRAVENOUS

## 2017-03-31 MED ORDER — SODIUM CHLORIDE 0.9 % IV SOLN
INTRAVENOUS | Status: DC
  Administered 2017-03-31: 500 mL via INTRAVENOUS

## 2017-03-31 MED ORDER — FENTANYL CITRATE (PF) 100 MCG/2ML IJ SOLN
INTRAMUSCULAR | Status: AC
  Filled 2017-03-31: qty 2

## 2017-03-31 MED ORDER — MIDAZOLAM HCL 5 MG/ML IJ SOLN
INTRAMUSCULAR | Status: AC
  Filled 2017-03-31: qty 2

## 2017-03-31 NOTE — Discharge Instructions (Signed)

## 2017-03-31 NOTE — Op Note (Signed)
[facility] Patient Name: Allen Medina Procedure Date: 03/31/2017 MRN: [id number] Attending MD: Carol Ada , MD Date of Birth: 12-Mar-1941 CSN: [id number] Age: 76 Admit Type: Outpatient Procedure:                Colonoscopy Indications:              High risk colon cancer surveillance: Personal                            history of colonic polyps Providers:                Carol Ada, MD, Elmer Ramp. Hinson, RN, Alfonso Patten,                            Technician Referring MD:              Medicines:                Midazolam 4 mg IV, Fentanyl 50 micrograms IV Complications:            No immediate complications. Estimated Blood Loss:     Estimated blood loss was minimal. Procedure:                Pre-Anesthesia Assessment:                           - Prior to the procedure, a History and Physical                            was performed, and patient medications and                            allergies were reviewed. The patient's tolerance of                            previous anesthesia was also reviewed. The risks                            and benefits of the procedure and the sedation                            options and risks were discussed with the patient.                            All questions were answered, and informed consent                            was obtained. Prior Anticoagulants: The patient has                            taken no previous anticoagulant or antiplatelet                            agents. ASA Grade Assessment: IV - A patient with  severe systemic disease that is a constant threat                            to life. After reviewing the risks and benefits,                            the patient was deemed in satisfactory condition to                            undergo the procedure.                           - Sedation was administered by an endoscopy nurse.                            The sedation level  attained was moderate.                           After obtaining informed consent, the colonoscope                            was passed under direct vision. Throughout the                            procedure, the patient's blood pressure, pulse, and                            oxygen saturations were monitored continuously. The                            EC-[id number] ([id number]) scope was introduced through                            the anus and advanced to the the cecum, identified                            by appendiceal orifice and ileocecal valve. The                            colonoscopy was performed without difficulty. The                            patient tolerated the procedure well. The quality                            of the bowel preparation was good. The ileocecal                            valve, appendiceal orifice, and rectum were                            photographed. Scope In: 8:48:31 AM Scope Out: 9:11:59 AM Scope Withdrawal Time: 0 hours 19 minutes 21 seconds  Total Procedure Duration: 0 hours 23 minutes  28 seconds  Findings:      Four sessile polyps were found in the rectum, sigmoid colon, transverse       colon and ascending colon. The polyps were 3 to 4 mm in size. These       polyps were removed with a cold snare. Resection and retrieval were       complete. Impression:               - Four 3 to 4 mm polyps in the rectum, in the                            sigmoid colon, in the transverse colon and in the                            ascending colon, removed with a cold snare.                            Resected and retrieved. Moderate Sedation:      Moderate (conscious) sedation was administered by the endoscopy nurse       and supervised by the endoscopist. The following parameters were       monitored: oxygen saturation, heart rate, blood pressure, and response       to care. Recommendation:           - Patient has a contact number available for                             emergencies. The signs and symptoms of potential                            delayed complications were discussed with the                            patient. Return to normal activities tomorrow.                            Written discharge instructions were provided to the                            patient.                           - Resume previous diet.                           - Continue present medications.                           - Await pathology results.                           - Repeat colonoscopy in 3 years for surveillance. Procedure Code(s):        --- Professional ---                           [phone], Colonoscopy, flexible; with removal of  tumor(s), polyp(s), or other lesion(s) by snare                            technique Diagnosis Code(s):        --- Professional ---                           K62.1, Rectal polyp                           Z86.010, Personal history of colonic polyps                           D12.5, Benign neoplasm of sigmoid colon                           D12.3, Benign neoplasm of transverse colon (hepatic                            flexure or splenic flexure)                           D12.2, Benign neoplasm of ascending colon CPT copyright 2016 American Medical Association. All rights reserved. The codes documented in this report are preliminary and upon coder review may  be revised to meet current compliance requirements. Carol Ada, MD Carol Ada, MD 03/31/2017 9:17:41 AM This report has been signed electronically. Number of Addenda: 0

## 2017-03-31 NOTE — H&P (Signed)
Allen Medina HPI: On 11/30/2011 the patient had a colonoscopy with Dr. Collene Medina with findings of three cecal and ascending tubular adenomas.  He was recommended a 5 year follow up.  No interval change with any issues regarding his colon.  He is stable from the cardiac standpoint.  Past Medical History:  Diagnosis Date  . Arthritis    "hands" (04/16/2014)  . Blindness of left eye   . CAD (coronary artery disease) of bypass graft 2000   Occluded grafts --> redo CABG  . CAD, multiple vessel 1991   last cath 2006-patent grafts, last nuc 09/10/09-no ischemia  . Chronic lower back pain   . CKD (chronic kidney disease) stage 4, GFR 15-29 ml/min (HCC)    kidney stones,cmet 02/09/12, 02/13/12 on chart  . Claudication ([place])    LE dopplers 03/17/10, normal ABIs, normal pressures  . Diabetes mellitus type 2 with complications (HCC)    renal insuff, HTN, CAD, PVD  . History of blood transfusion 2010   "after back OR" (04/16/2014)  . HTN (hypertension)    difficult to control  . Hyperlipidemia LDL goal < 100   . Kidney stones   . Myocardial infarction 2000   LOV with EKG Dr Allen Medina 7/12 on chart, eccho 10/03, stress test 9/10 on chart, chest x ray 2/13 EPIC, chest CT 02/13/12 on chart  . Neuropathy in diabetes ([place])   . Peripheral vascular disease ([place])    peripheral neuropathy, claudication  . Pneumonia    hx of  . S/P CABG x 4 2000   SVG-OM1, SVG-OM 2-OM 3, SVG-diagonal, free RIMA-RPDA ( has stent in the vessel from 2003)-  . S/P CABG x 5    only LIMA-LAD still patent  . Spinal stenosis   . Thin skin   . Vitamin B 12 deficiency     Past Surgical History:  Procedure Laterality Date  . ANTERIOR LAT LUMBAR FUSION N/A 04/16/2014   Procedure: Lateral  Lumbar Two-Three/Three-Four Interbody and Fusion with Lateral Pedicle Screw Fixation Lumbar Two-Four;  Surgeon: Allen Schools, MD;  Location: [place];  Service: Orthopedics;  Laterality: N/A;  . BACK SURGERY  2009, 2010  . CARDIAC CATHETERIZATION   06/04/1990   severe 3 vessel dz  . CARDIAC CATHETERIZATION  01/13/97   patent grafts, nl LV function  . CARDIAC CATHETERIZATION  01/28/1999   severe native CAD with disease involving all 3 SVG, recommend redo CABG  . CARDIAC CATHETERIZATION  05/16/2002   patent graft except of mod to sever stenosis in the midprotion of the RIMA to the posterior descending artery  . CARDIAC CATHETERIZATION  08/28/2003   nl lv fxn, patent grafts to all vessels with small vessel disease  . CARDIAC CATHETERIZATION  07/07/2005   patent grafts, EF 45-50%  . CARPAL TUNNEL RELEASE Right   . CATARACT EXTRACTION W/ INTRAOCULAR LENS  IMPLANT, BILATERAL    . CHOLECYSTECTOMY    . CORONARY ANGIOPLASTY WITH STENT PLACEMENT  05/22/2002   3.0x76mm cypher stent covered the proximal and distal portion of RIMA, final inflation -16x60  . [place] & 2000   only LIMA-LAD patent  . CYSTOSCOPY W/ STONE MANIPULATION  2013  . CYSTOSCOPY/RETROGRADE/URETEROSCOPY/STONE EXTRACTION WITH BASKET  02/16/2012   Procedure: CYSTOSCOPY/RETROGRADE/URETEROSCOPY/STONE EXTRACTION WITH BASKET;  Surgeon: Allen So, MD;  Location: [facility];  Service: Urology;  Laterality: Left;  Left Ureterscopy with Stone Extraction  . DOPPLER ECHOCARDIOGRAPHY  07/09/2013   EF 65-78 (up from 50-55% in 2013), minimally  increased transvalvular velocity across the aortic valve suggestive of very mild stenosis to simply sclerosis. Normal diastolic indices  . LATERAL FUSION LUMBAR SPINE  04/16/2014  . NM MYOVIEW LTD  02/2013   no evidence of ischemia or infarct, septal hypokinesis/dyskinesis   . REDO - CORONARY ARTERY BYPASS GRAFT  2000    previous LIMA to LAD patent; seqSVG - OM 2, OM 3, SVG-D3, SVG-OM1, fRIMA-rPDA  . TONSILLECTOMY      History reviewed. No pertinent family history.  Social History:  reports that he has never smoked. He has never used smokeless tobacco. He reports that he does not drink alcohol or use drugs.  Allergies:  No Known Allergies  Medications:  Scheduled:  Continuous: . sodium chloride 500 mL (03/31/17 0744)    Results for orders placed or performed during the hospital encounter of 03/31/17 (from the past 24 hour(s))  Glucose, capillary     Status: None   Collection Time: 03/31/17  7:37 AM  Result Value Ref Range   Glucose-Capillary 89 65 - 99 mg/dL     No results found.  ROS:  As stated above in the HPI otherwise negative.  Blood pressure (!) 180/63, temperature 98.1 F (36.7 C), temperature source Oral, resp. rate (!) 22, height 5\' 6"  (1.676 m), weight 97.1 kg (214 lb), SpO2 96 %.    PE: Gen: NAD, Alert and Oriented HEENT:  [name], EOMI Neck: Supple, no LAD Lungs: CTA Bilaterally CV: RRR without M/G/R ABM: Soft, NTND, +BS Ext: No C/C/E  Assessment/Plan: 1) Personal history of polyps - colonoscopy.  Allen Medina D 03/31/2017, 8:37 AM

## 2017-05-25 ENCOUNTER — Encounter: Payer: Self-pay | Admitting: Podiatry

## 2017-05-25 ENCOUNTER — Ambulatory Visit (INDEPENDENT_AMBULATORY_CARE_PROVIDER_SITE_OTHER): Payer: Medicare Other | Admitting: Podiatry

## 2017-05-25 DIAGNOSIS — B351 Tinea unguium: Secondary | ICD-10-CM | POA: Diagnosis not present

## 2017-05-25 DIAGNOSIS — M79676 Pain in unspecified toe(s): Secondary | ICD-10-CM

## 2017-05-25 DIAGNOSIS — M2042 Other hammer toe(s) (acquired), left foot: Secondary | ICD-10-CM

## 2017-05-25 DIAGNOSIS — E1142 Type 2 diabetes mellitus with diabetic polyneuropathy: Secondary | ICD-10-CM

## 2017-05-25 DIAGNOSIS — M2041 Other hammer toe(s) (acquired), right foot: Secondary | ICD-10-CM

## 2017-05-25 DIAGNOSIS — L600 Ingrowing nail: Secondary | ICD-10-CM

## 2017-05-25 MED ORDER — NEOMYCIN-POLYMYXIN-HC 1 % OT SOLN: OTIC | 1 refills | Status: DC

## 2017-05-25 NOTE — Patient Instructions (Signed)

## 2017-05-25 NOTE — Progress Notes (Signed)
He presents today as a diabetic with a chief complaint of painful elongated toenails and painful hallux nail left. It also like to see about getting some diabetic shoes. He's had the hallux nail of the right foot removed in the past and like to have the left one removed as well. States that his last hemoglobin A1c was 7.0 but is more than likely less now since his blood sugars have been running in the low 120s.  Objective: Vital signs are stable alert and oriented 3. Pulses are palpable. Neurologic sensory is intact but slightly diminished her vibratory sensation in per Semmes-Weinstein monofilament. Dorsalis pedis artery slightly diminished bilateral foot. Has thick yellow dystrophic onychomycotic nails to the lesser nail plates. A very large thick painful nail to the hallux left.  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 Painful hallux nail left. Pain limb secondary to onychomycosis lesser nails.  Plan: Debridement of nails 1 through 5 bilateral. Performed chemical matrixectomy hallux left after local anesthesia was measured. We also initiated paperwork and measured him for diabetic shoes today. Follow up with him in 1-2 weeks. He was provided with both oral and written instructions for care and soaking of the toe. He is also provided instruction both oral and written home going from soaking of his toe and a prescription for Cortisporin Otic reapplied.

## 2017-06-05 ENCOUNTER — Ambulatory Visit (INDEPENDENT_AMBULATORY_CARE_PROVIDER_SITE_OTHER): Payer: Medicare Other | Admitting: Ophthalmology

## 2017-06-05 DIAGNOSIS — H35033 Hypertensive retinopathy, bilateral: Secondary | ICD-10-CM

## 2017-06-05 DIAGNOSIS — E113591 Type 2 diabetes mellitus with proliferative diabetic retinopathy without macular edema, right eye: Secondary | ICD-10-CM

## 2017-06-05 DIAGNOSIS — I10 Essential (primary) hypertension: Secondary | ICD-10-CM

## 2017-06-05 DIAGNOSIS — E11311 Type 2 diabetes mellitus with unspecified diabetic retinopathy with macular edema: Secondary | ICD-10-CM | POA: Diagnosis not present

## 2017-06-05 DIAGNOSIS — E113512 Type 2 diabetes mellitus with proliferative diabetic retinopathy with macular edema, left eye: Secondary | ICD-10-CM | POA: Diagnosis not present

## 2017-06-08 ENCOUNTER — Ambulatory Visit (INDEPENDENT_AMBULATORY_CARE_PROVIDER_SITE_OTHER): Payer: Self-pay | Admitting: Podiatry

## 2017-06-08 ENCOUNTER — Encounter: Payer: Self-pay | Admitting: Podiatry

## 2017-06-08 DIAGNOSIS — L600 Ingrowing nail: Secondary | ICD-10-CM

## 2017-06-08 NOTE — Progress Notes (Signed)
He presents today for follow-up of his total nail avulsion with matrixectomy hallux left. He states that he is doing very well without complications.  Objective: Vital signs are stable he is alert and oriented 3. No erythema or edema cellulitis drainage or odor. The toenail bed appears to be healing in very nicely and very quickly. I see no signs of infection.  Assessment: Well-healing surgical toe hallux left.  Plan: Discontinue Betadine spell with Epsom salts and warm water soaks covered in the daytime but leave open at bedtime to need Cortisporin otic drops. Follow-up with me if this does not go on to heal in the next week or 2.

## 2017-06-08 NOTE — Patient Instructions (Signed)

## 2017-06-28 ENCOUNTER — Telehealth: Payer: Self-pay | Admitting: Podiatry

## 2017-06-28 NOTE — Telephone Encounter (Signed)
Yes I called and left a message yesterday in regards to my shoes that were ordered several weeks ago. I have not heard anything back. I am just wanting to know the status of them. Please call me back today at [phone].

## 2017-06-28 NOTE — Telephone Encounter (Signed)
Called patient and informed him that Dr Brigitte Pulse signed the paperwork on Monday.  Patient said wait what did Dr Brigitte Pulse have to do with this.  I explained Medicare's requirements for verification of treatment of diabetes by an MD and that we have what is required now.  Shoes have been ordered and inserts are being made we will call him to schedule an appointment for a fitting when they arrive usually 3-4 weeks.  Patient stated understanding.

## 2017-07-11 ENCOUNTER — Ambulatory Visit (INDEPENDENT_AMBULATORY_CARE_PROVIDER_SITE_OTHER): Payer: Medicare Other | Admitting: Orthotics

## 2017-07-11 DIAGNOSIS — M2041 Other hammer toe(s) (acquired), right foot: Secondary | ICD-10-CM

## 2017-07-11 DIAGNOSIS — E1142 Type 2 diabetes mellitus with diabetic polyneuropathy: Secondary | ICD-10-CM | POA: Diagnosis not present

## 2017-07-11 DIAGNOSIS — B351 Tinea unguium: Secondary | ICD-10-CM | POA: Diagnosis not present

## 2017-07-11 DIAGNOSIS — M2042 Other hammer toe(s) (acquired), left foot: Secondary | ICD-10-CM

## 2017-07-11 DIAGNOSIS — M79676 Pain in unspecified toe(s): Secondary | ICD-10-CM

## 2017-07-13 NOTE — Progress Notes (Signed)

## 2017-08-01 ENCOUNTER — Ambulatory Visit (INDEPENDENT_AMBULATORY_CARE_PROVIDER_SITE_OTHER): Payer: Medicare Other | Admitting: Podiatry

## 2017-08-01 ENCOUNTER — Encounter: Payer: Self-pay | Admitting: Podiatry

## 2017-08-01 DIAGNOSIS — L03032 Cellulitis of left toe: Secondary | ICD-10-CM

## 2017-08-01 MED ORDER — DOXYCYCLINE HYCLATE 100 MG PO TABS
100.0000 mg | ORAL_TABLET | Freq: Two times a day (BID) | ORAL | 0 refills | Status: DC
Start: 2017-08-01 — End: 2019-07-01

## 2017-08-01 NOTE — Progress Notes (Signed)
He presents today for follow-up of his nail avulsion hallux left. States this seems to be slower to heal. States that he loads his diabetic shoes.  Objective: Vital signs are stable he is alert and oriented 3 the nailbed appears to be granulating in width and epithelializing. I see no signs of an action that we cannot completely rule out some underlying local bioburden.  Assessment: Slowly epithelializing toe hallux left.  Plan: Started him on doxycycline and I'll follow-up with him in a couple of weeks.

## 2017-09-25 ENCOUNTER — Other Ambulatory Visit: Payer: Self-pay | Admitting: Orthopedic Surgery

## 2017-09-25 DIAGNOSIS — Z981 Arthrodesis status: Secondary | ICD-10-CM

## 2017-09-26 ENCOUNTER — Ambulatory Visit
Admission: RE | Admit: 2017-09-26 | Discharge: 2017-09-26 | Disposition: A | Discharge status: Home / Self Care | Payer: Medicare Other | Source: Ambulatory Visit | Attending: Orthopedic Surgery | Admitting: Orthopedic Surgery

## 2017-09-26 DIAGNOSIS — Z981 Arthrodesis status: Secondary | ICD-10-CM

## 2017-12-28 ENCOUNTER — Other Ambulatory Visit (HOSPITAL_COMMUNITY): Payer: Self-pay | Admitting: Internal Medicine

## 2017-12-28 DIAGNOSIS — I739 Peripheral vascular disease, unspecified: Secondary | ICD-10-CM

## 2017-12-29 ENCOUNTER — Ambulatory Visit (HOSPITAL_COMMUNITY)
Admission: RE | Admit: 2017-12-29 | Discharge: 2017-12-29 | Disposition: A | Discharge status: Home / Self Care | Payer: Medicare Other | Source: Ambulatory Visit | Attending: Vascular Surgery | Admitting: Vascular Surgery

## 2017-12-29 DIAGNOSIS — I739 Peripheral vascular disease, unspecified: Secondary | ICD-10-CM | POA: Diagnosis not present

## 2017-12-29 DIAGNOSIS — E1151 Type 2 diabetes mellitus with diabetic peripheral angiopathy without gangrene: Secondary | ICD-10-CM | POA: Diagnosis not present

## 2017-12-29 DIAGNOSIS — E785 Hyperlipidemia, unspecified: Secondary | ICD-10-CM | POA: Diagnosis not present

## 2017-12-29 DIAGNOSIS — I10 Essential (primary) hypertension: Secondary | ICD-10-CM | POA: Diagnosis not present

## 2018-01-03 ENCOUNTER — Encounter (HOSPITAL_COMMUNITY): Payer: Medicare Other

## 2018-06-05 ENCOUNTER — Ambulatory Visit (INDEPENDENT_AMBULATORY_CARE_PROVIDER_SITE_OTHER): Payer: Medicare Other | Admitting: Ophthalmology

## 2018-06-05 DIAGNOSIS — H35033 Hypertensive retinopathy, bilateral: Secondary | ICD-10-CM | POA: Diagnosis not present

## 2018-06-05 DIAGNOSIS — E113593 Type 2 diabetes mellitus with proliferative diabetic retinopathy without macular edema, bilateral: Secondary | ICD-10-CM

## 2018-06-05 DIAGNOSIS — E11319 Type 2 diabetes mellitus with unspecified diabetic retinopathy without macular edema: Secondary | ICD-10-CM

## 2018-06-05 DIAGNOSIS — I10 Essential (primary) hypertension: Secondary | ICD-10-CM | POA: Diagnosis not present

## 2018-06-15 ENCOUNTER — Other Ambulatory Visit: Payer: Self-pay | Admitting: Nephrology

## 2018-06-15 ENCOUNTER — Ambulatory Visit
Admission: RE | Admit: 2018-06-15 | Discharge: 2018-06-15 | Disposition: A | Discharge status: Home / Self Care | Payer: Medicare Other | Source: Ambulatory Visit | Attending: Nephrology | Admitting: Nephrology

## 2018-06-15 DIAGNOSIS — N189 Chronic kidney disease, unspecified: Secondary | ICD-10-CM

## 2018-12-01 ENCOUNTER — Other Ambulatory Visit: Payer: Self-pay

## 2018-12-01 ENCOUNTER — Encounter (HOSPITAL_COMMUNITY): Payer: Self-pay

## 2018-12-01 ENCOUNTER — Emergency Department (HOSPITAL_COMMUNITY)
Admission: EM | Admit: 2018-12-01 | Discharge: 2018-12-01 | Disposition: A | Discharge status: Home / Self Care | Payer: Medicare Other | Attending: Emergency Medicine | Admitting: Emergency Medicine

## 2018-12-01 DIAGNOSIS — G8929 Other chronic pain: Secondary | ICD-10-CM | POA: Diagnosis not present

## 2018-12-01 DIAGNOSIS — M542 Cervicalgia: Secondary | ICD-10-CM

## 2018-12-01 DIAGNOSIS — I2510 Atherosclerotic heart disease of native coronary artery without angina pectoris: Secondary | ICD-10-CM | POA: Insufficient documentation

## 2018-12-01 DIAGNOSIS — Z79899 Other long term (current) drug therapy: Secondary | ICD-10-CM | POA: Diagnosis not present

## 2018-12-01 DIAGNOSIS — Z794 Long term (current) use of insulin: Secondary | ICD-10-CM | POA: Insufficient documentation

## 2018-12-01 DIAGNOSIS — I129 Hypertensive chronic kidney disease with stage 1 through stage 4 chronic kidney disease, or unspecified chronic kidney disease: Secondary | ICD-10-CM | POA: Insufficient documentation

## 2018-12-01 DIAGNOSIS — N184 Chronic kidney disease, stage 4 (severe): Secondary | ICD-10-CM | POA: Diagnosis not present

## 2018-12-01 DIAGNOSIS — E119 Type 2 diabetes mellitus without complications: Secondary | ICD-10-CM | POA: Diagnosis not present

## 2018-12-01 LAB — BASIC METABOLIC PANEL
ANION GAP: 8 (ref 5–15)
BUN: 37 mg/dL — ABNORMAL HIGH (ref 8–23)
CALCIUM: 8.8 mg/dL — AB (ref 8.9–10.3)
CO2: 29 mmol/L (ref 22–32)
Chloride: 102 mmol/L (ref 98–111)
Creatinine, Ser: 2.22 mg/dL — ABNORMAL HIGH (ref 0.61–1.24)
GFR, EST AFRICAN AMERICAN: 32 mL/min — AB (ref >60)
GFR, EST NON AFRICAN AMERICAN: 28 mL/min — AB (ref >60)
GLUCOSE: 110 mg/dL — AB (ref 70–99)
POTASSIUM: 4.6 mmol/L (ref 3.5–5.1)
Sodium: 139 mmol/L (ref 135–145)

## 2018-12-01 LAB — I-STAT CHEM 8, ED
BUN: 35 mg/dL — AB (ref 8–23)
CREATININE: 2.5 mg/dL — AB (ref 0.61–1.24)
Calcium, Ion: 1.12 mmol/L — ABNORMAL LOW (ref 1.15–1.40)
Chloride: 102 mmol/L (ref 98–111)
Glucose, Bld: 111 mg/dL — ABNORMAL HIGH (ref 70–99)
HEMATOCRIT: 37 % — AB (ref 39.0–52.0)
HEMOGLOBIN: 12.6 g/dL — AB (ref 13.0–17.0)
POTASSIUM: 5.2 mmol/L — AB (ref 3.5–5.1)
SODIUM: 140 mmol/L (ref 135–145)
TCO2: 33 mmol/L — AB (ref 22–32)

## 2018-12-01 MED ORDER — CYCLOBENZAPRINE HCL 10 MG PO TABS: 10.0000 mg | ORAL_TABLET | Freq: Two times a day (BID) | ORAL | 0 refills | Status: DC | PRN

## 2018-12-01 MED ORDER — METHOCARBAMOL 500 MG PO TABS
500.0000 mg | ORAL_TABLET | Freq: Once | ORAL | Status: AC
  Administered 2018-12-01: 500 mg via ORAL
  Filled 2018-12-01: qty 1

## 2018-12-01 MED ORDER — HYDROMORPHONE HCL 1 MG/ML IJ SOLN
1.0000 mg | Freq: Once | INTRAMUSCULAR | Status: AC
  Administered 2018-12-01: 1 mg via INTRAMUSCULAR
  Filled 2018-12-01: qty 1

## 2018-12-01 MED ORDER — HYDROCODONE-ACETAMINOPHEN 5-325 MG PO TABS: 1.0000 | ORAL_TABLET | Freq: Four times a day (QID) | ORAL | 0 refills | Status: AC | PRN

## 2018-12-01 NOTE — ED Triage Notes (Addendum)
Pt reports neck pain that started a couple of days ago. Pt says it started after getting out of bed and progressively gotten worse. Pt reports using heating pad relieves pain temporarily, but then pain is back. Pt reports pain when moving head any direction. No injury. Left side of neck is tender with palpation.

## 2018-12-01 NOTE — Discharge Instructions (Addendum)
Repeat lab work showed normal potassium.  Take the hydrocodone as needed for pain.  Take the Flexeril as directed to help relax the muscles.  Make an appointment to follow-up with your doctors.  Return for any new or worse symptoms.

## 2018-12-01 NOTE — ED Provider Notes (Signed)
[facility] EMERGENCY DEPARTMENT Provider Note   CSN: [id number] Arrival date & time: 12/01/18  8676     History   Chief Complaint Chief Complaint  Patient presents with  . Torticollis    HPI Allen Medina is a 77 y.o. male.  Patient with a history of chronic back pain.  Started 2 days ago with posterior neck pain bilaterally.  Worse with movement.  Was exceptionally worse last night.  No numbness or weakness or any pain radiating into the arms or hands.  No history of injury.  No fevers.  Patient states left-sided neck is worse than right.     Past Medical History:  Diagnosis Date  . Arthritis    "hands" (04/16/2014)  . Blindness of left eye   . CAD (coronary artery disease) of bypass graft 2000   Occluded grafts --> redo CABG  . CAD, multiple vessel 1991   last cath 2006-patent grafts, last nuc 09/10/09-no ischemia  . Chronic lower back pain   . CKD (chronic kidney disease) stage 4, GFR 15-29 ml/min (HCC)    kidney stones,cmet 02/09/12, 02/13/12 on chart  . Claudication ()    LE dopplers 03/17/10, normal ABIs, normal pressures  . Diabetes mellitus type 2 with complications (HCC)    renal insuff, HTN, CAD, PVD  . History of blood transfusion 2010   "after back OR" (04/16/2014)  . HTN (hypertension)    difficult to control  . Hyperlipidemia LDL goal < 100   . Kidney stones   . Myocardial infarction ([place]) 2000   LOV with EKG Dr Rex Kras 7/12 on chart, eccho 10/03, stress test 9/10 on chart, chest x ray 2/13 EPIC, chest CT 02/13/12 on chart  . Neuropathy in diabetes ([place])   . Peripheral vascular disease ([place])    peripheral neuropathy, claudication  . Pneumonia    hx of  . S/P CABG x 4 2000   SVG-OM1, SVG-OM 2-OM 3, SVG-diagonal, free RIMA-RPDA ( has stent in the vessel from 2003)-  . S/P CABG x 5    only LIMA-LAD still patent  . Spinal stenosis   . Thin skin   . Vitamin B 12 deficiency     Patient Active Problem List   Diagnosis Date Noted  . Difficulty in  walking(719.7) 06/10/2014  . Weakness of left leg 06/10/2014  . Back pain 04/16/2014  . Preoperative cardiovascular examination 03/15/2014  . Pre-operative cardiovascular examination 03/15/2014  . CAD, multiple vessel; native LAD and circumflex 100% occluded; distal RCA severely diseased with possible PL or RPDA occluded 06/30/2013  . Obesity (BMI 30.0-34.9) 06/30/2013  . Peripheral vascular disease ([place])   . HTN (hypertension)   . 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 06/26/2013  . Diabetes ([place]) 03/17/2013  . CAD (coronary artery disease) of artery bypass graft - only LIMA-LAD patent from CABG in 91; rredo CABG 2000   . CKD (chronic kidney disease) stage 4, GFR 15-29 ml/min (HCC)   . Hyperlipidemia LDL goal < 100   . Spinal stenosis   . Vitamin B 12 deficiency     Past Surgical History:  Procedure Laterality Date  . ANTERIOR LAT LUMBAR FUSION N/A 04/16/2014   Procedure: Lateral  Lumbar Two-Three/Three-Four Interbody and Fusion with Lateral Pedicle Screw Fixation Lumbar Two-Four;  Surgeon: Melina Schools, MD;  Location: [facility];  Service: Orthopedics;  Laterality: N/A;  . BACK SURGERY  2009, 2010  . CARDIAC CATHETERIZATION  06/04/1990   severe 3 vessel dz  . CARDIAC CATHETERIZATION  01/13/97   patent grafts, nl LV function  . CARDIAC CATHETERIZATION  01/28/1999   severe native CAD with disease involving all 3 SVG, recommend redo CABG  . CARDIAC CATHETERIZATION  05/16/2002   patent graft except of mod to sever stenosis in the midprotion of the RIMA to the posterior descending artery  . CARDIAC CATHETERIZATION  08/28/2003   nl lv fxn, patent grafts to all vessels with small vessel disease  . CARDIAC CATHETERIZATION  07/07/2005   patent grafts, EF 45-50%  . CARPAL TUNNEL RELEASE Right   . CATARACT EXTRACTION W/ INTRAOCULAR LENS  IMPLANT, BILATERAL    . CHOLECYSTECTOMY    . COLONOSCOPY N/A 03/31/2017   Procedure: COLONOSCOPY;  Surgeon: Carol Ada, MD;  Location: [facility]  END[facility]COPY;  Service: Endoscopy;  Laterality: N/A;  . CORONARY ANGIOPLASTY WITH STENT PLACEMENT  05/22/2002   3.0x75mm cypher stent covered the proximal and distal portion of RIMA, final inflation -16x60  . [place] & 2000   only LIMA-LAD patent  . CYSTOSCOPY W/ STONE MANIPULATION  2013  . CYSTOSCOPY/RETROGRADE/URETEROSCOPY/STONE EXTRACTION WITH BASKET  02/16/2012   Procedure: CYSTOSCOPY/RETROGRADE/URETEROSCOPY/STONE EXTRACTION WITH BASKET;  Surgeon: Malka So, MD;  Location: [facility];  Service: Urology;  Laterality: Left;  Left Ureterscopy with Stone Extraction  . DOPPLER ECHOCARDIOGRAPHY  07/09/2013   EF 65-78 (up from 50-55% in 2013), minimally increased transvalvular velocity across the aortic valve suggestive of very mild stenosis to simply sclerosis. Normal diastolic indices  . DOPPLER ECHOCARDIOGRAPHY    . LATERAL FUSION LUMBAR SPINE  04/16/2014  . NM MYOVIEW LTD  02/2013   no evidence of ischemia or infarct, septal hypokinesis/dyskinesis   . REDO - CORONARY ARTERY BYPASS GRAFT  2000    previous LIMA to LAD patent; seqSVG - OM 2, OM 3, SVG-D3, SVG-OM1, fRIMA-rPDA  . TONSILLECTOMY          Home Medications    Prior to Admission medications   Medication Sig Start Date End Date Taking? Authorizing Provider  albuterol (PROAIR HFA) 108 (90 Base) MCG/ACT inhaler Inhale 2 puffs into the lungs every 6 (six) hours as needed for wheezing or shortness of breath.    [provider]  carvedilol (COREG) 6.25 MG tablet Take 6.25 mg by mouth 2 (two) times daily.    [provider]  Cholecalciferol (VITAMIN D3) 5000 units CAPS Take 5,000 Units by mouth daily.    [provider]  cyanocobalamin (,VITAMIN B-12,) 1000 MCG/ML injection Inject 1,000 mcg into the muscle every 30 (thirty) days.     [provider]  cyclobenzaprine (FLEXERIL) 10 MG tablet Take 1 tablet (10 mg total) by mouth 2 (two) times daily as needed for muscle spasms.  12/01/18   Fredia Sorrow, MD  doxycycline (VIBRA-TABS) 100 MG tablet Take 1 tablet (100 mg total) by mouth 2 (two) times daily. 08/01/17   Hyatt, Max T, DPM  Dulaglutide (TRULICITY) 1.5 ZD/6.3OV SOPN Inject 1.5 mg into the skin every Saturday.    [provider]  Febuxostat (ULORIC) 80 MG TABS Take 80 mg by mouth daily.    [provider]  fluticasone furoate-vilanterol (BREO ELLIPTA) 100-25 MCG/INH AEPB Inhale 1 puff into the lungs daily as needed (shortness of breath).    [provider]  furosemide (LASIX) 40 MG tablet Take 40 mg by mouth daily.    [provider]  HYDROcodone-acetaminophen (NORCO/VICODIN) 5-325 MG tablet Take 1 tablet by mouth every 6 (six) hours as needed. 12/01/18  Fredia Sorrow, MD  Insulin Glargine (TOUJEO SOLOSTAR) 300 UNIT/ML SOPN Inject 23 Units into the skin daily.    [provider]  insulin lispro (HUMALOG KWIKPEN) 100 UNIT/ML KiwkPen Inject 12 Units into the skin 2 (two) times daily.     [provider]  Melatonin 5 MG SUBL Place 5 mg under the tongue at bedtime.     [provider]  NEOMYCIN-POLYMYXIN-HYDROCORTISONE (CORTISPORIN) 1 % SOLN OTIC solution Apply 1-2 drops to toe BID after soaking 05/25/17   Hyatt, Max T, DPM  omeprazole (PRILOSEC) 40 MG capsule Take 40 mg by mouth daily.    [provider]  OVER THE COUNTER MEDICATION Apply 1 application topically daily as needed (muscle pain). horse liniment otc topical analgesic    [provider]  pregabalin (LYRICA) 75 MG capsule Take 75 mg by mouth 3 (three) times daily.    [provider]  rosuvastatin (CRESTOR) 10 MG tablet Take 10 mg by mouth at bedtime.    [provider]  tadalafil (CIALIS) 5 MG tablet Take 5 mg by mouth at bedtime.     [provider]  valsartan (DIOVAN) 160 MG tablet Take 160 mg by mouth daily.    [provider]  zolpidem (AMBIEN) 10 MG tablet Take 10 mg by mouth at  bedtime.    [provider]    Family History No family history on file.  Social History Social History   Tobacco Use  . Smoking status: Never Smoker  . Smokeless tobacco: Never Used  Substance Use Topics  . Alcohol use: No  . Drug use: No     Allergies   Patient has no known allergies.   Review of Systems Review of Systems  Constitutional: Negative for fever.  HENT: Negative for congestion.   Eyes: Negative for visual disturbance.  Respiratory: Negative for shortness of breath.   Cardiovascular: Negative for chest pain.  Gastrointestinal: Negative for abdominal pain.  Genitourinary: Negative for dysuria.  Musculoskeletal: Positive for back pain and neck pain.  Skin: Negative for rash.  Neurological: Negative for weakness, numbness and headaches.  Hematological: Does not bruise/bleed easily.  Psychiatric/Behavioral: Negative for confusion.     Physical Exam Updated Vital Signs BP (!) 144/66   Pulse 67   Temp (!) 97.5 F (36.4 C) (Oral)   Resp 12   Ht 1.676 m (5\' 6" )   Wt 91.2 kg   SpO2 96%   BMI 32.44 kg/m   Physical Exam Vitals signs and nursing note reviewed.  Constitutional:      General: He is not in acute distress.    Appearance: He is not toxic-appearing.  HENT:     Head: Normocephalic and atraumatic.     Mouth/Throat:     Mouth: Mucous membranes are moist.  Eyes:     Extraocular Movements: Extraocular movements intact.     Conjunctiva/sclera: Conjunctivae normal.     Pupils: Pupils are equal, round, and reactive to light.  Neck:     Musculoskeletal: No neck rigidity.     Comments: Mild tenderness to palpation with left side of neck.  No muscle spasm no lateral muscle spasm.  No tenderness or muscle spasm noted on the right side.  Patient with some range of motion but does increase pain to the left side. Cardiovascular:     Rate and Rhythm: Normal rate and regular rhythm.  Pulmonary:     Effort: Pulmonary effort is normal.      Breath sounds: Normal  breath sounds.  Abdominal:     Palpations: Abdomen is soft.     Tenderness: There is no abdominal tenderness.  Musculoskeletal: Normal range of motion.        General: No swelling.  Skin:    General: Skin is warm.     Capillary Refill: Capillary refill takes less than 2 seconds.  Neurological:     General: No focal deficit present.     Mental Status: He is alert and oriented to person, place, and time.      ED Treatments / Results  Labs (all labs ordered are listed, but only abnormal results are displayed) Labs Reviewed  BASIC METABOLIC PANEL - Abnormal; Notable for the following components:      Result Value   Glucose, Bld 110 (*)    BUN 37 (*)    Creatinine, Ser 2.22 (*)    Calcium 8.8 (*)    GFR calc non Af Amer 28 (*)    GFR calc Af Amer 32 (*)    All other components within normal limits  I-STAT CHEM 8, ED - Abnormal; Notable for the following components:   Potassium 5.2 (*)    BUN 35 (*)    Creatinine, Ser 2.50 (*)    Glucose, Bld 111 (*)    Calcium, Ion 1.12 (*)    TCO2 33 (*)    Hemoglobin 12.6 (*)    HCT 37.0 (*)    All other components within normal limits    EKG None  Radiology No results found.  Procedures Procedures (including critical care time)  Medications Ordered in ED Medications  methocarbamol (ROBAXIN) tablet 500 mg (500 mg Oral Given 12/01/18 0240)  HYDROmorphone (DILAUDID) injection 1 mg (1 mg Intramuscular Given 12/01/18 0741)     Initial Impression / Assessment and Plan / ED Course  I have reviewed the triage vital signs and the nursing notes.  Pertinent labs & imaging results that were available during my care of the patient were reviewed by me and considered in my medical decision making (see chart for details).    Patient symptoms not classic for torticollis.  May very well just be posterior neck pain.  Patient has not had trouble with that in the past.  Patient without any numbness or weakness or  radiation of pain into the upper extremities.  Will treat symptomatically for now.  Patient will be treated with hydrocodone at home and Flexeril.  Patient received hydromorphone here with significant improvement.  Patient had an i-STAT chemistry done prior to I saw him and had elevation in potassium and a little worsening of his grade 4 renal failure.  Patient is not on dialysis.  Repeated this with a basic metabolic panel and potassium was normal and renal function was more to his baseline.  Patient stable for discharge home will follow up with primary care doctor on Monday if not improved.   Final Clinical Impressions(s) / ED Diagnoses   Final diagnoses:  Neck pain, acute    ED Discharge Orders         Ordered    HYDROcodone-acetaminophen (NORCO/VICODIN) 5-325 MG tablet  Every 6 hours PRN     12/01/18 0844    cyclobenzaprine (FLEXERIL) 10 MG tablet  2 times daily PRN     12/01/18 0844           Fredia Sorrow, MD 12/01/18 [id number]

## 2019-02-20 ENCOUNTER — Telehealth: Payer: Self-pay

## 2019-02-20 NOTE — Telephone Encounter (Signed)
[employer] called stating in the last 4 days he has gained 7.4 lbs; He has swelling in feet/ankles for past 5 days; Skin looks shiny and he hasnt missed any doses of his lasix; Pt is currently [place] vacation at the beach so he has been eating more than usual with more salt intake in his diet; Just a fyi

## 2019-02-20 NOTE — Telephone Encounter (Signed)
Make him aware and he will go into CHF

## 2019-03-25 IMAGING — US US RENAL
1 series · 14 of 25 positions shown · non-contrast
Comparison: None.

CLINICAL DATA: Acute renal failure.  Chronic kidney disease.

EXAM:
RENAL / URINARY TRACT ULTRASOUND COMPLETE

[Series 1: us renal · 0.23mm/px · 14 of 38 slices shown]
[im 1/38]
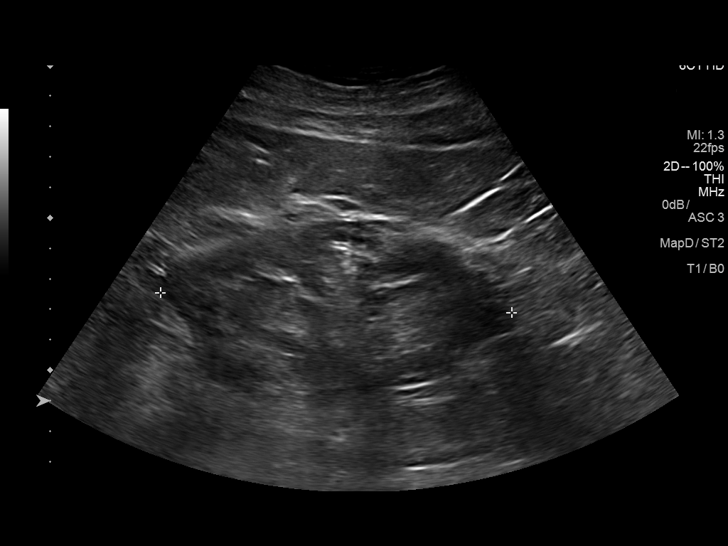
[im 4/38]
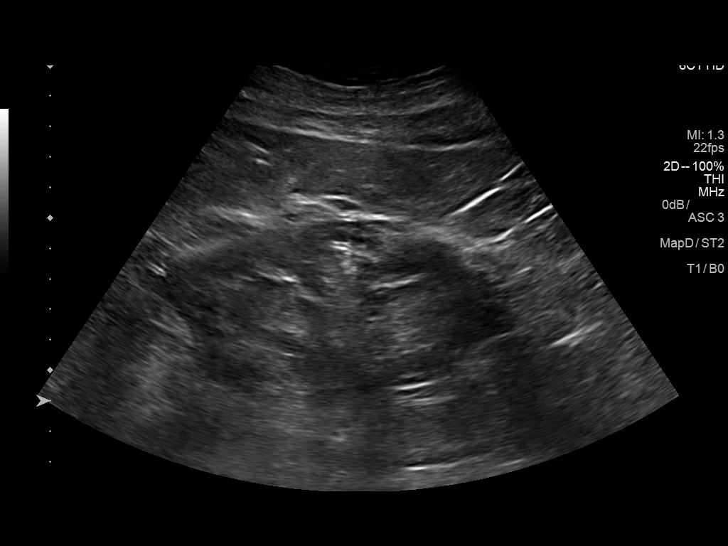
[im 7/38]
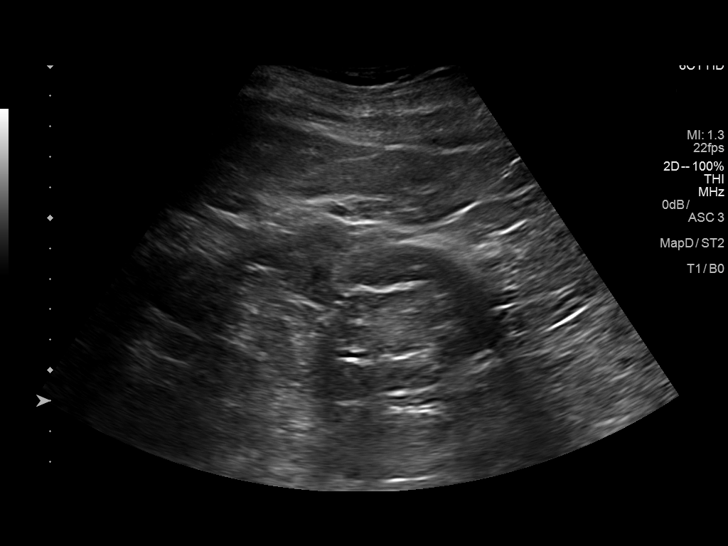
[im 10/38]
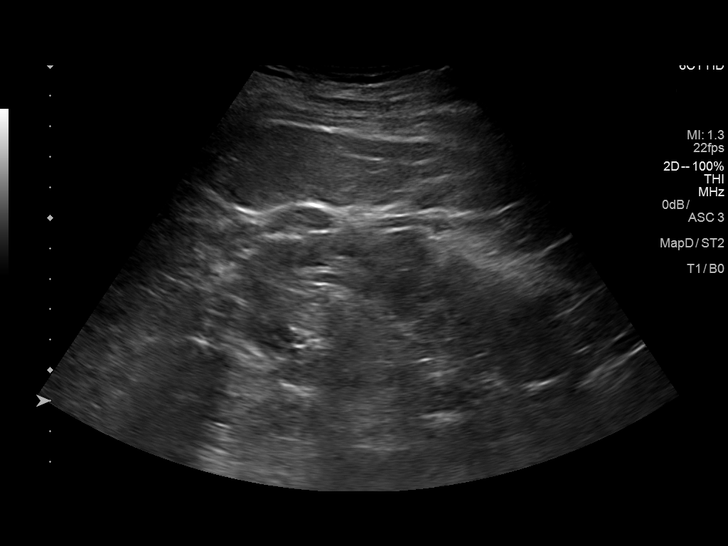
[im 13/38]
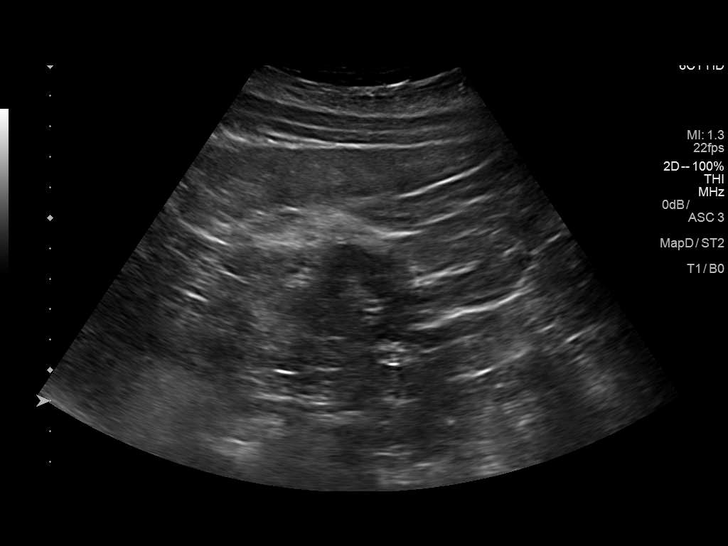
[im 14/38]
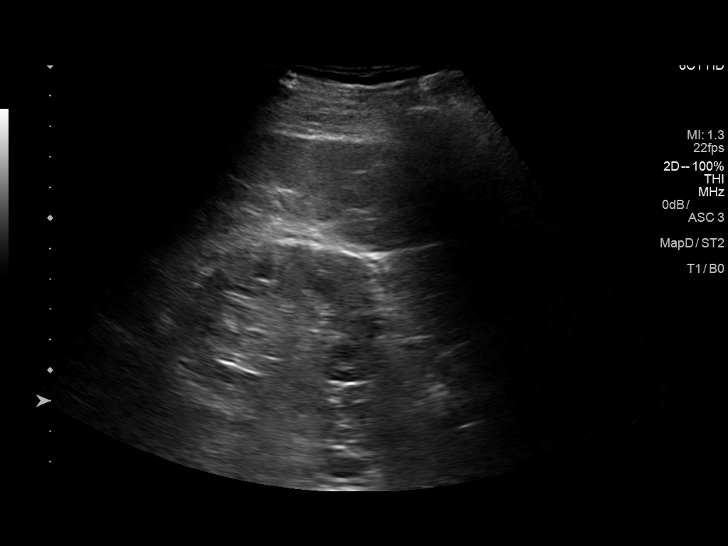
[im 17/38]
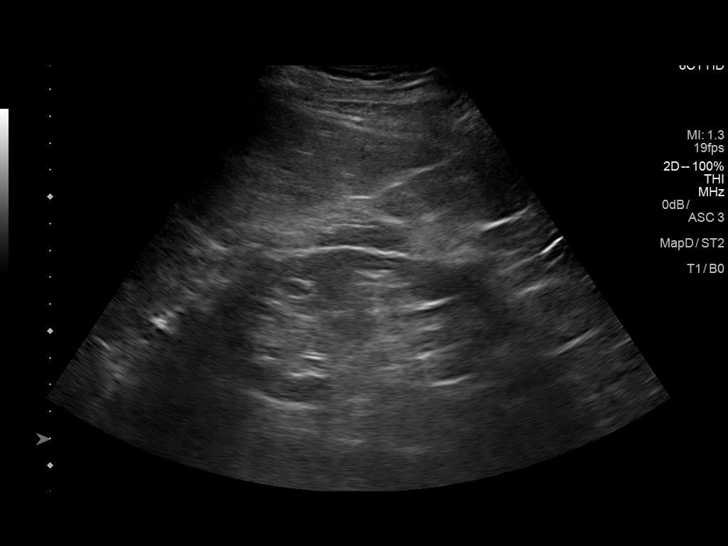
[im 21/38]
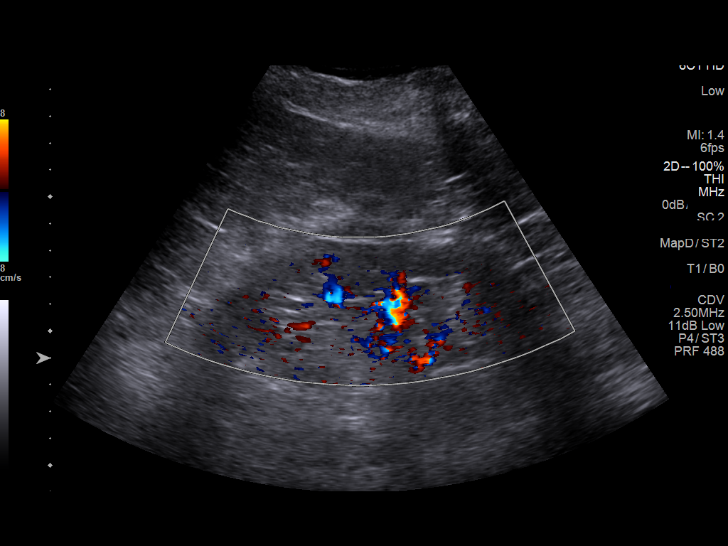
[im 24/38]
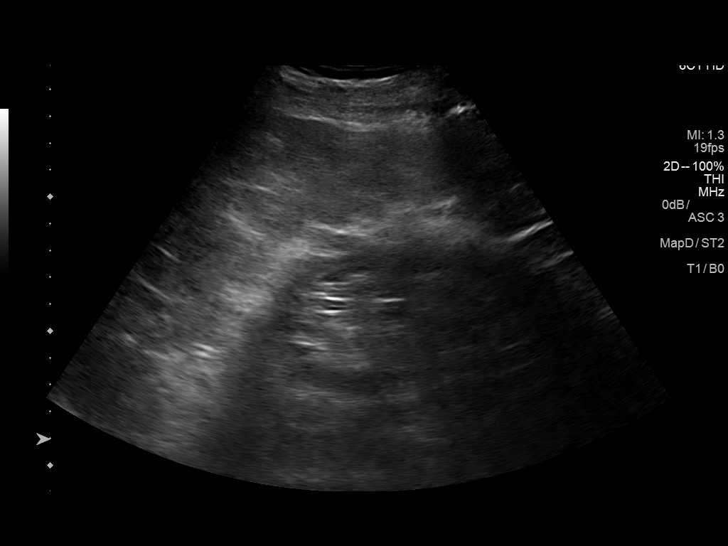
[im 25/38]
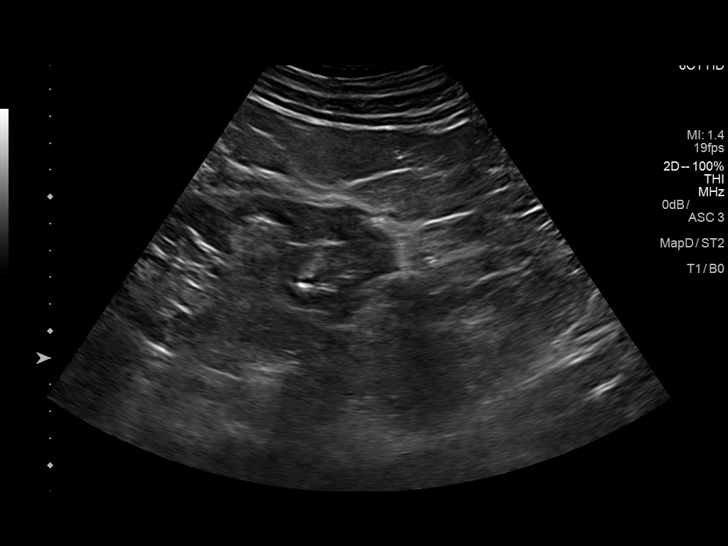
[im 28/38]
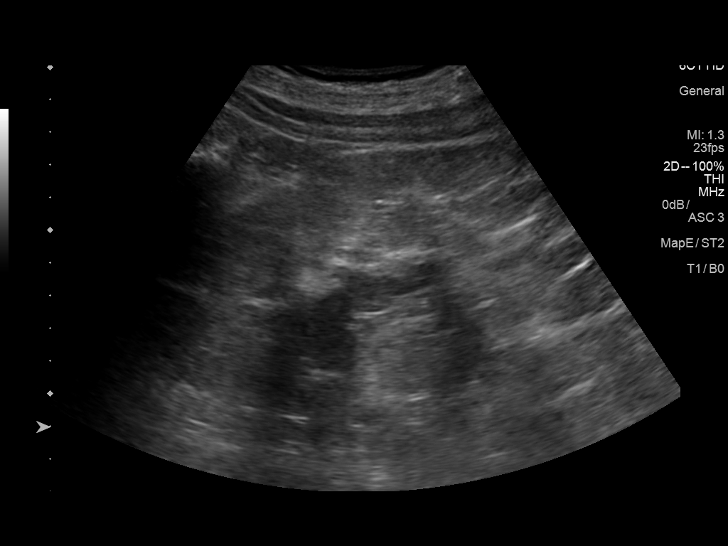
[im 31/38]
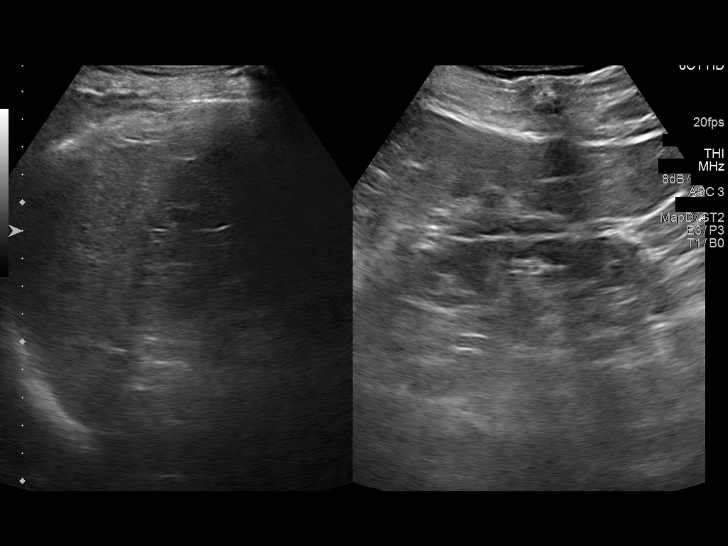
[im 34/38]
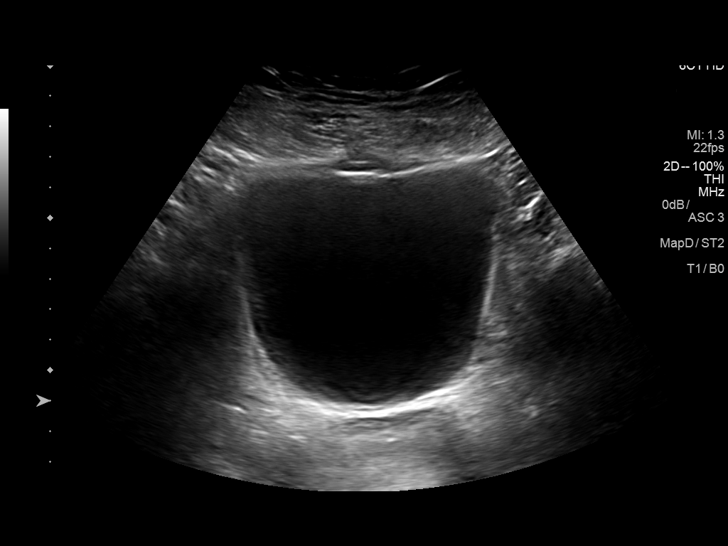
[im 38/38]
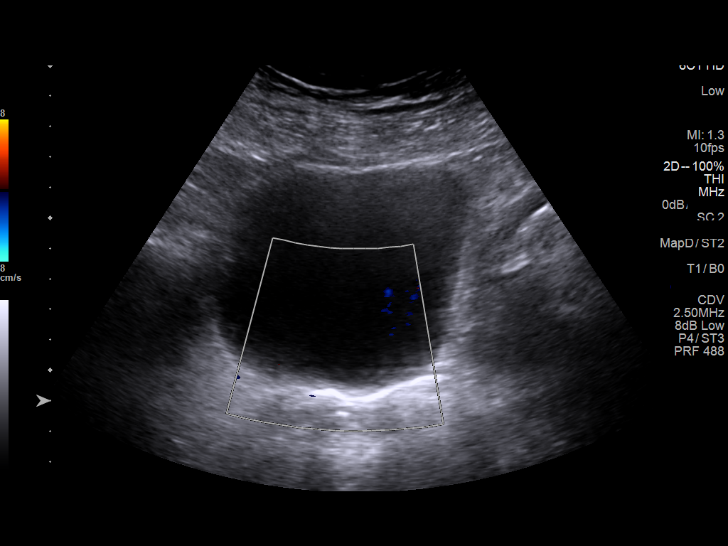

[14 of 25 positions shown; findings below may reference images not displayed]

FINDINGS: Right Kidney:

Length: 11.5 cm. Increased cortical echogenicity. Cortical thinning.

Left Kidney:

Length: 11.6 cm. Increased cortical echogenicity. Cortical thinning.

Bladder:

Appears normal for degree of bladder distention.
IMPRESSION: 1. Both kidneys demonstrate increased cortical echogenicity and
cortical thinning consistent with medical renal disease. No
hydronephrosis.

## 2019-04-16 ENCOUNTER — Encounter (HOSPITAL_COMMUNITY): Payer: Medicare Other

## 2019-04-29 ENCOUNTER — Telehealth (HOSPITAL_COMMUNITY): Payer: Self-pay | Admitting: *Deleted

## 2019-04-29 NOTE — Telephone Encounter (Signed)
The above patient or their representative was contacted and gave the following answers to these questions:         Do you have any of the following symptoms?no Fever                    Cough                   Shortness of breath  Do  you have any of the following other symptoms? no   muscle pain         vomiting,        diarrhea        rash         weakness        red eye        abdominal pain         bruising          bruising or bleeding              joint pain           severe headache    Have you been in contact with someone who was or has been sick in the past 2 weeks?no  Yes                 Unsure                         Unable to assess   Does the person that you were in contact with have any of the following symptoms? no  Cough         shortness of breath           muscle pain         vomiting,            diarrhea            rash            weakness           fever            red eye           abdominal pain           bruising  or  bleeding                joint pain                severe headache               Have you  or someone you have been in contact with traveled internationally in th last month?   no      If yes, which countries?   Have you  or someone you have been in contact with traveled outside [place] in th last month?     no    If yes, which state and city?   COMMENTS OR ACTION PLAN FOR THIS PATIENT:

## 2019-05-01 ENCOUNTER — Other Ambulatory Visit (HOSPITAL_COMMUNITY): Payer: Self-pay | Admitting: Internal Medicine

## 2019-05-01 ENCOUNTER — Ambulatory Visit (HOSPITAL_COMMUNITY)
Admission: RE | Admit: 2019-05-01 | Discharge: 2019-05-01 | Disposition: A | Discharge status: Home / Self Care | Payer: Medicare Other | Source: Ambulatory Visit | Attending: Family | Admitting: Family

## 2019-05-01 DIAGNOSIS — I739 Peripheral vascular disease, unspecified: Secondary | ICD-10-CM | POA: Diagnosis not present

## 2019-05-01 DIAGNOSIS — I779 Disorder of arteries and arterioles, unspecified: Secondary | ICD-10-CM

## 2019-05-06 ENCOUNTER — Other Ambulatory Visit: Payer: Self-pay

## 2019-05-06 NOTE — Progress Notes (Signed)
No idea about this upload :-)

## 2019-05-06 NOTE — Progress Notes (Signed)
Are you planning on scanning individual labs separately?

## 2019-05-14 NOTE — Progress Notes (Signed)
Labs 05/02/2019: BUN 23, creatinine 1.29, potassium 5.5, CMP normal 7 mg.  Total cholesterol 107, triglycerides 54, HDL 47, LDL 49.  HB 2.2/HCT 87.6, platelets 243.  TSH normal.  A1c 7.3%.  04/16/2019: Serum glucose 1 1 6 mg, BUN 40, creatinine 2.6, EGFR 24 mL, potassium 4.6.  Hemoglobin 11.8, HCT 35.8, platelets 180.

## 2019-06-10 ENCOUNTER — Encounter (INDEPENDENT_AMBULATORY_CARE_PROVIDER_SITE_OTHER): Payer: Medicare Other | Admitting: Ophthalmology

## 2019-06-10 ENCOUNTER — Other Ambulatory Visit: Payer: Self-pay

## 2019-06-10 DIAGNOSIS — H35033 Hypertensive retinopathy, bilateral: Secondary | ICD-10-CM

## 2019-06-10 DIAGNOSIS — E113593 Type 2 diabetes mellitus with proliferative diabetic retinopathy without macular edema, bilateral: Secondary | ICD-10-CM | POA: Diagnosis not present

## 2019-06-10 DIAGNOSIS — E11319 Type 2 diabetes mellitus with unspecified diabetic retinopathy without macular edema: Secondary | ICD-10-CM | POA: Diagnosis not present

## 2019-06-10 DIAGNOSIS — I10 Essential (primary) hypertension: Secondary | ICD-10-CM

## 2019-06-19 ENCOUNTER — Ambulatory Visit: Payer: Self-pay | Admitting: Cardiology

## 2019-07-01 ENCOUNTER — Other Ambulatory Visit: Payer: Self-pay

## 2019-07-01 ENCOUNTER — Encounter: Payer: Self-pay | Admitting: Cardiology

## 2019-07-01 ENCOUNTER — Ambulatory Visit (INDEPENDENT_AMBULATORY_CARE_PROVIDER_SITE_OTHER): Payer: Medicare Other | Admitting: Cardiology

## 2019-07-01 VITALS — BP 108/63 | HR 58 | Ht 66.0 in | Wt 203.0 lb

## 2019-07-01 DIAGNOSIS — I25708 Atherosclerosis of coronary artery bypass graft(s), unspecified, with other forms of angina pectoris: Secondary | ICD-10-CM

## 2019-07-01 DIAGNOSIS — I129 Hypertensive chronic kidney disease with stage 1 through stage 4 chronic kidney disease, or unspecified chronic kidney disease: Secondary | ICD-10-CM

## 2019-07-01 DIAGNOSIS — I10 Essential (primary) hypertension: Secondary | ICD-10-CM

## 2019-07-01 DIAGNOSIS — I2510 Atherosclerotic heart disease of native coronary artery without angina pectoris: Secondary | ICD-10-CM | POA: Diagnosis not present

## 2019-07-01 DIAGNOSIS — N184 Chronic kidney disease, stage 4 (severe): Secondary | ICD-10-CM | POA: Diagnosis not present

## 2019-07-01 NOTE — Progress Notes (Signed)
Virtual Visit via Telephone Note: Patient unable to use video assisted device.  This visit type was conducted due to national recommendations for restrictions regarding the COVID-19 Pandemic (e.g. social distancing).  This format is felt to be most appropriate for this patient at this time.  All issues noted in this document were discussed and addressed.  No physical exam was performed.  The patient has consented to conduct a Telehealth visit and understands insurance will be billed.   I connected with@, on 07/01/19 at  by TELEPHONE and verified that I am speaking with the correct person using two identifiers.   I discussed the limitations of evaluation and management by telemedicine and the availability of in person appointments. The patient expressed understanding and agreed to proceed.   I have discussed with patient regarding the safety during COVID Pandemic and steps and precautions to be taken including social distancing, frequent hand wash and use of detergent soap, gels with the patient. I asked the patient to avoid touching mouth, nose, eyes, ears with the hands. I encouraged regular walking around the neighborhood and exercise and regular diet, as long as social distancing can be maintained.   Primary Physician/Referring:  Marton Redwood, MD  Patient ID: Allen Medina, male    DOB: 07-28-1941, 78 y.o.   MRN: [id number]  Subjective   Chief Complaint  Patient presents with  . Coronary Artery Disease  . Hypertension  . Follow-up   HPI: Allen Medina  is a 78 y.o. male  Caucasian male with CAD and S/P CABG 1991 followed by redo CABG in 2003 due to occluded bypass graft except free RIMA to LAD,  MI in 2006 needing stent to free RIMA graft to LAD, type 2 diabetes, stage IV CKD being followed by Dr. Elmarie Shiley, diabetic peripheral neuropathy, diabetic retinopathy, hypertension, hyperlipidemia.  His last hospitalization was in 10/15/2018 in [place] admitted for  hypertensive  urgency and also chest discomfort and was told to have a negative nuclear stress test.  This is a 36-monthoffice visit and follow-up.  He has chronic stable angina pectoris.  Over the past 6 months he has not used any sublingual nitroglycerin.  He has chronic back pain, degenerative joint disease and peripheral neuropathy.   Past Medical History:  Diagnosis Date  . Arthritis    "hands" (04/16/2014)  . Blindness of left eye   . CAD (coronary artery disease) of bypass graft 2000   Occluded grafts --> redo CABG  . CAD, multiple vessel 1991   last cath 2006-patent grafts, last nuc 09/10/09-no ischemia  . Chronic lower back pain   . CKD (chronic kidney disease) stage 4, GFR 15-29 ml/min (HCC)    kidney stones,cmet 02/09/12, 02/13/12 on chart  . Claudication ([facility]    LE dopplers 03/17/10, normal ABIs, normal pressures  . Diabetes mellitus type 2 with complications (HCC)    renal insuff, HTN, CAD, PVD  . History of blood transfusion 2010   "after back OR" (04/16/2014)  . HTN (hypertension)    difficult to control  . Hyperlipidemia LDL goal < 100   . Kidney stones   . Myocardial infarction ([facility] 2000   LOV with EKG Dr LRex Kras7/12 on chart, eccho 10/03, stress test 9/10 on chart, chest x ray 2/13 EPIC, chest CT 02/13/12 on chart  . [place]athy in diabetes ([place]   . Peripheral vascular disease ([facility]    peripheral neuropathy, claudication  . Pneumonia    hx of  . S/P  CABG x 4 2000   SVG-OM1, SVG-OM 2-OM 3, SVG-diagonal, free RIMA-RPDA ( has stent in the vessel from 2003)-  . S/P CABG x 5    only LIMA-LAD still patent  . Spinal stenosis   . Thin skin   . Vitamin B 12 deficiency     Past Surgical History:  Procedure Laterality Date  . ANTERIOR LAT LUMBAR FUSION N/A 04/16/2014   Procedure: Lateral  Lumbar Two-Three/Three-Four Interbody and Fusion with Lateral Pedicle Screw Fixation Lumbar Two-Four;  Surgeon: Melina Schools, MD;  Location: [facility];  Service: Orthopedics;  Laterality: N/A;  . BACK  SURGERY  2009, 2010  . CARDIAC CATHETERIZATION  06/04/1990   severe 3 vessel dz  . CARDIAC CATHETERIZATION  01/13/97   patent grafts, nl LV function  . CARDIAC CATHETERIZATION  01/28/1999   severe native CAD with disease involving all 3 SVG, recommend redo CABG  . CARDIAC CATHETERIZATION  05/16/2002   patent graft except of mod to sever stenosis in the midprotion of the RIMA to the posterior descending artery  . CARDIAC CATHETERIZATION  08/28/2003   nl lv fxn, patent grafts to all vessels with small vessel disease  . CARDIAC CATHETERIZATION  07/07/2005   patent grafts, EF 45-50%  . CARPAL TUNNEL RELEASE Right   . CATARACT EXTRACTION W/ INTRAOCULAR LENS  IMPLANT, BILATERAL    . CHOLECYSTECTOMY    . COLONOSCOPY N/A 03/31/2017   Procedure: COLONOSCOPY;  Surgeon: Carol Ada, MD;  Location: [facility];  Service: Endoscopy;  Laterality: N/A;  . CORONARY ANGIOPLASTY WITH STENT PLACEMENT  05/22/2002   3.0x58m cypher stent covered the proximal and distal portion of RIMA, final inflation -16x60  . CFort McDermitt& 2000   only LIMA-LAD patent  . CYSTOSCOPY W/ STONE MANIPULATION  2013  . CYSTOSCOPY/RETROGRADE/URETEROSCOPY/STONE EXTRACTION WITH BASKET  02/16/2012   Procedure: CYSTOSCOPY/RETROGRADE/URETEROSCOPY/STONE EXTRACTION WITH BASKET;  Surgeon: JMalka So MD;  Location: [facility];  Service: Urology;  Laterality: Left;  Left Ureterscopy with Stone Extraction  . DOPPLER ECHOCARDIOGRAPHY  07/09/2013   EF 65-78 (up from 50-55% in 2013), minimally increased transvalvular velocity across the aortic valve suggestive of very mild stenosis to simply sclerosis. Normal diastolic indices  . DOPPLER ECHOCARDIOGRAPHY    . LATERAL FUSION LUMBAR SPINE  04/16/2014  . NM MYOVIEW LTD  02/2013   no evidence of ischemia or infarct, septal hypokinesis/dyskinesis   . REDO - CORONARY ARTERY BYPASS GRAFT  2000    previous LIMA to LAD patent; seqSVG - OM 2, OM 3, SVG-D3, SVG-OM1, fRIMA-rPDA  .  TONSILLECTOMY      Social History   Socioeconomic History  . Marital status: Married    Spouse name: Not on file  . Number of children: 3  . Years of education: Not on file  . Highest education level: Not on file  Occupational History  . Not on file  Social Needs  . Financial resource strain: Not on file  . Food insecurity    Worry: Not on file    Inability: Not on file  . Transportation needs    Medical: Not on file    Non-medical: Not on file  Tobacco Use  . Smoking status: Never Smoker  . Smokeless tobacco: Never Used  Substance and Sexual Activity  . Alcohol use: No  . Drug use: No  . Sexual activity: Not Currently  Lifestyle  . Physical activity    Days per week: Not on file    Minutes per  session: Not on file  . Stress: Not on file  Relationships  . Social Herbalist on phone: Not on file    Gets together: Not on file    Attends religious service: Not on file    Active member of club or organization: Not on file    Attends meetings of clubs or organizations: Not on file    Relationship status: Not on file  . Intimate partner violence    Fear of current or ex partner: Not on file    Emotionally abused: Not on file    Physically abused: Not on file    Forced sexual activity: Not on file  Other Topics Concern  . Not on file  Social History Narrative   He is a married father of three, grandfather of nine, great-grandfather of four. He usually walks   daily about 20 minutes a day - but not recently due to back pain. Does not currently smoke and does not drink.    Review of Systems  Constitution: Negative for chills, decreased appetite, malaise/fatigue and weight gain.  Cardiovascular: Positive for chest pain and claudication (from back pain). Negative for dyspnea on exertion, leg swelling and syncope.  Endocrine: Negative for cold intolerance.  Hematologic/Lymphatic: Does not bruise/bleed easily.  Musculoskeletal: Positive for back pain. Negative  for joint swelling.  Gastrointestinal: Negative for abdominal pain, anorexia, change in bowel habit, hematochezia and melena.  Neurological: Negative for headaches and light-headedness.  Psychiatric/Behavioral: Negative for depression and substance abuse.  All other systems reviewed and are negative.  Objective  Blood pressure 108/63, pulse (!) 58, height 5' 6" (1.676 m), weight 203 lb (92.1 kg). Body mass index is 32.77 kg/m.  Physical exam not performed or limited due to virtual visit.   Please see exam details from prior visit is as below.  Physical Exam  Constitutional: He appears well-developed. No distress.  Mildly obese  HENT:  Head: Atraumatic.  Eyes: Conjunctivae are normal.  Neck: Neck supple. No JVD present. No thyromegaly present.  Cardiovascular: Normal rate, regular rhythm, normal heart sounds and intact distal pulses. Exam reveals no gallop.  No murmur heard. No edema.   Pulmonary/Chest: Effort normal and breath sounds normal.  Abdominal: Soft. Bowel sounds are normal.  Musculoskeletal: Normal range of motion.  Neurological: He is alert.  Skin: Skin is warm and dry.  Psychiatric: He has a normal mood and affect.   Radiology: No results found.  Laboratory examination:   Labs 04/16/2019: Vitamin B12 2000.  Serum glucose _0 mg, BUN 40, creatinine 2.6, EGFR 24 mL, potassium 4.6, CMP otherwise normal.  Hb 11.8/HCT 35.8, platelets 180.  Total cholesterol 103, triglycerides 114, HDL 36, LDL 44.  Non-HDL cholesterol 67.  TSH normal.  CMP Latest Ref Rng & Units 12/01/2018 12/01/2018 04/18/2014  Glucose 70 - 99 mg/dL 110(H) 111(H) 225(H)  BUN 8 - 23 mg/dL 37(H) 35(H) 36(H)  Creatinine 0.61 - 1.24 mg/dL 2.22(H) 2.50(H) 2.08(H)  Sodium 135 - 145 mmol/L 139 140 135(L)  Potassium 3.5 - 5.1 mmol/L 4.6 5.2(H) 4.6  Chloride 98 - 111 mmol/L 102 102 98  CO2 22 - 32 mmol/L 29 - 24  Calcium 8.9 - 10.3 mg/dL 8.8(L) - 8.4  Total Protein 6.0 - 8.3 g/dL - - -  Total Bilirubin 0.3  - 1.2 mg/dL - - -  Alkaline Phos 39 - 117 U/L - - -  AST 0 - 37 U/L - - -  ALT 0 - 53  U/L - - -   CBC Latest Ref Rng & Units 12/01/2018 04/18/2014 04/17/2014  WBC 4.0 - 10.5 K/uL - 11.7(H) 12.2(H)  Hemoglobin 13.0 - 17.0 g/dL 12.6(L) 10.4(L) 10.7(L)  Hematocrit 39.0 - 52.0 % 37.0(L) 30.7(L) 31.4(L)  Platelets 150 - 400 K/uL - 188 216   Lipid Panel     Component Value Date/Time   CHOL 96 03/17/2013 0800   TRIG 75 03/17/2013 0800   HDL 51 03/17/2013 0800   CHOLHDL 1.9 03/17/2013 0800   VLDL 15 03/17/2013 0800   LDLCALC 30 03/17/2013 0800   HEMOGLOBIN A1C Lab Results  Component Value Date   HGBA1C 7.4 (H) 04/16/2014   MPG 166 (H) 04/16/2014   TSH No results for input(s): TSH in the last 8760 hours.  Medications   Current Outpatient Medications  Medication Instructions  . albuterol (PROAIR HFA) 108 (90 Base) MCG/ACT inhaler 2 puffs, Inhalation, Every 6 hours PRN  . carvedilol (COREG) 6.25 mg, Oral, 2 times daily  . cyanocobalamin ((VITAMIN B-12)) 1,000 mcg, Intramuscular, Every 30 days  . Dulaglutide (TRULICITY) 1.5 mg, Subcutaneous, Every Sat  . Febuxostat (ULORIC) 80 mg, Oral, Daily  . fluticasone furoate-vilanterol (BREO ELLIPTA) 100-25 MCG/INH AEPB 1 puff, Inhalation, Daily PRN  . furosemide (LASIX) 40 mg, Oral, Daily  . HYDROcodone-acetaminophen (NORCO/VICODIN) 5-325 MG tablet 1 tablet, Oral, Every 6 hours PRN  . Insulin Glargine (TOUJEO SOLOSTAR) 23 Units, Subcutaneous, Daily  . insulin lispro (HUMALOG KWIKPEN) 12 Units, Subcutaneous, 2 times daily  . Melatonin 5 mg, Sublingual, Daily at bedtime  . olmesartan (BENICAR) 40 mg, Oral, Daily  . omeprazole (PRILOSEC) 40 mg, Oral, Daily  . pregabalin (LYRICA) 75 mg, Oral, 3 times daily  . rosuvastatin (CRESTOR) 10 mg, Oral, Daily at bedtime  . tadalafil (CIALIS) 5 mg, Oral, Daily at bedtime  . Vitamin D3 5,000 Units, Oral, Daily  . zolpidem (AMBIEN) 10 mg, Oral, Daily at bedtime    Cardiac Studies:   Echocardiogram   11/30/2018: 1. Poor echo window, wall motion with reduced sensitivity. Left ventricle cavity is normal in size. Normal left ventricular shape. Abnormal septal wall motion due to post-operative coronary artery bypass graft. Doppler evidence of grade II (pseudonormal) diastolic dysfunction. Diastolic dysfunction findings suggests elevated LA/LV end diastolic pressure. Calculated EF 56%. 2. Mild (Grade I) mitral regurgitation. 3. Mild to moderate tricuspid regurgitation. Mild pulmonary hypertension. Estimated pulmonary artery systolic pressure 32 mmHg  Assessment  CAD, multiple vessel - CABG 2000: LIMA to LAD, SVG-OM1, SVG-OM 2-OM 3, SVG-diagonal, free RIMA-RPDA (has stent in [place] from 2003)- Patent grafts in 2006   Coronary artery disease of bypass graft of native heart with stable angina pectoris ([place]) - CABG 2000: LIMA to LAD, SVG-OM1, SVG-OM 2-OM 3, SVG-diagonal, free RIMA-RPDA (has stent in [place] from 2003)- Patent grafts in 2006  Essential hypertension  CKD (chronic kidney disease) stage 4, GFR 15-29 ml/min (HCC)  EKG 11/19/2018: Normal sinus rhythm at rate of 62 bpm, normal axis, poor R-wave progression, cannot exclude anteroseptal infarct old.  Nonspecific T abnormality.  Recommendations:   Patient is being seen on a virtual visit and follow-up at 6 months, he has not had any recurrence of angina pectoris fortunately.  He has severe multivessel coronary artery disease and has had redo bypass surgery in the past.  His blood pressure is now well controlled, renal failure has remained stable as well.  Diabetes continues to be controlled.  I have reviewed the results of the recently performed labs and updated them.  Lipids are being managed by his PCP.  From cardiac standpoint he appears to remain stable, I would like to see him back in person in 6 months for follow-up.  Adrian Prows, MD, [facility] 07/01/2019, 12:55 PM [facility] [place] Pager: [phone] Office: [phone] If no  answer Cell [phone]

## 2019-07-08 ENCOUNTER — Telehealth: Payer: Self-pay

## 2019-07-08 NOTE — Telephone Encounter (Signed)
Cut Olmesartan to 20 mg daily. TO call if SBP > 130/80

## 2019-07-08 NOTE — Telephone Encounter (Signed)
Amanda with [facility] Telephonic Program called stating that they are suppose to report diastolic below 60 and today pt was 109/56. Around the same yesterday and 99/59 Saturday. No dizziness, no other symptoms. Wants to know if they need to change his parameters on there end or please advise.//ah

## 2019-07-09 NOTE — Telephone Encounter (Signed)
[facility] for [place] [phone] [id number] advising of instructions.//ah

## 2019-07-10 NOTE — Telephone Encounter (Signed)
Pt aware of instructions. Med list updated.//ah

## 2019-07-10 NOTE — Telephone Encounter (Signed)
LMOM to return call.//ah

## 2019-11-29 ENCOUNTER — Other Ambulatory Visit: Payer: Self-pay

## 2019-11-29 ENCOUNTER — Encounter: Payer: Self-pay | Admitting: Cardiology

## 2019-11-29 ENCOUNTER — Ambulatory Visit: Payer: Medicare Other | Admitting: Cardiology

## 2019-11-29 VITALS — BP 143/64 | HR 55 | Ht 66.0 in | Wt 200.0 lb

## 2019-11-29 DIAGNOSIS — I2121 ST elevation (STEMI) myocardial infarction involving left circumflex coronary artery: Secondary | ICD-10-CM

## 2019-11-29 DIAGNOSIS — I25118 Atherosclerotic heart disease of native coronary artery with other forms of angina pectoris: Secondary | ICD-10-CM

## 2019-11-29 DIAGNOSIS — I25708 Atherosclerosis of coronary artery bypass graft(s), unspecified, with other forms of angina pectoris: Secondary | ICD-10-CM

## 2019-11-29 DIAGNOSIS — E7800 Pure hypercholesterolemia, unspecified: Secondary | ICD-10-CM

## 2019-11-29 DIAGNOSIS — I10 Essential (primary) hypertension: Secondary | ICD-10-CM

## 2019-11-29 DIAGNOSIS — I129 Hypertensive chronic kidney disease with stage 1 through stage 4 chronic kidney disease, or unspecified chronic kidney disease: Secondary | ICD-10-CM

## 2019-11-29 DIAGNOSIS — N184 Chronic kidney disease, stage 4 (severe): Secondary | ICD-10-CM

## 2019-11-29 MED ORDER — TICAGRELOR 90 MG PO TABS: 90.0000 mg | ORAL_TABLET | Freq: Two times a day (BID) | ORAL | 3 refills | Status: DC

## 2019-11-29 MED ORDER — ROSUVASTATIN CALCIUM 20 MG PO TABS: 20.0000 mg | ORAL_TABLET | Freq: Every day | ORAL | 3 refills | Status: DC

## 2019-11-29 NOTE — Progress Notes (Signed)
Primary Physician/Referring:  Marton Redwood, MD  Patient ID: Allen Medina, male    DOB: [birth year], 78 y.o.   MRN: [id number]  Subjective   Chief Complaint  Patient presents with  . Coronary Artery Disease  . Hypertension  . Follow-up    MI   HPI: Allen Medina  is a 78 y.o. Caucasian male   with type 2 diabetes, stage IV CKD being followed by Dr. Elmarie Shiley, diabetic peripheral neuropathy, diabetic retinopathy, hypertension, hyperlipidemia, Chronic back pain and digital joint disease and peripheral neuropathy, CAD and S/P CABG 1991 followed by redo CABG in 2003 due to occluded bypass grafts except free RIMA to LAD,  MI in 2006 needing stent to free [place] graft to LAD, Again admitted with acute inferior and lateral myocardial infarction on 11/13/2019, was emergently taken to the cardiac catheterization lab and underwent successful stenting to SVG to OM 2 and also to native distal OM 2 with implantation of 2 overlapping DES.  He recuperated well, he now presents for follow-up.  He is presently doing well, states that he has gone back to doing all his activities of daily living and his routine activities without any limitations.  He has not used any sublingual nitroglycerin, no dyspnea, no leg edema.  Past Medical History:  Diagnosis Date  . Arthritis    "hands" (04/16/2014)  . Blindness of left eye   . CAD (coronary artery disease) of bypass graft 2000   Occluded grafts --> redo CABG  . CAD, multiple vessel 1991   last cath 2006-patent grafts, last nuc 09/10/09-no ischemia  . Chronic lower back pain   . CKD (chronic kidney disease) stage 4, GFR 15-29 ml/min (HCC)    kidney stones,cmet 02/09/12, 02/13/12 on chart  . Claudication ([place])    LE dopplers 03/17/10, normal ABIs, normal pressures  . Diabetes mellitus type 2 with complications (HCC)    renal insuff, HTN, CAD, PVD  . History of blood transfusion 2010   "after back OR" (04/16/2014)  . HTN (hypertension)    difficult to  control  . Hx of myocardial infarction   . Hyperlipidemia LDL goal < 100   . Kidney stones   . Myocardial infarction ([place]) 2000   LOV with EKG Dr Rex Kras 7/12 on chart, eccho 10/03, stress test 9/10 on chart, chest x ray 2/13 EPIC, chest CT 02/13/12 on chart  . Neuropathy in diabetes (Allen Medina)   . Peripheral vascular disease ([place])    peripheral neuropathy, claudication  . Pneumonia    hx of  . S/P CABG x 4 2000   SVG-OM1, SVG-OM 2-OM 3, SVG-diagonal, free RIMA-RPDA ( has stent in the vessel from 2003)-  . S/P CABG x 5    only LIMA-LAD still patent  . Spinal stenosis   . Thin skin   . Vitamin B 12 deficiency     Past Surgical History:  Procedure Laterality Date  . ANTERIOR LAT LUMBAR FUSION N/A 04/16/2014   Procedure: Lateral  Lumbar Two-Three/Three-Four Interbody and Fusion with Lateral Pedicle Screw Fixation Lumbar Two-Four;  Surgeon: Melina Schools, MD;  Location: [facility];  Service: Orthopedics;  Laterality: N/A;  . BACK SURGERY  2009, 2010  . CARDIAC CATHETERIZATION  06/04/1990   severe 3 vessel dz  . CARDIAC CATHETERIZATION  01/13/97   patent grafts, nl LV function  . CARDIAC CATHETERIZATION  01/28/1999   severe native CAD with disease involving all 3 SVG, recommend redo CABG  . CARDIAC CATHETERIZATION  05/16/2002  patent graft except of mod to sever stenosis in the midprotion of the RIMA to the posterior descending artery  . CARDIAC CATHETERIZATION  08/28/2003   nl lv fxn, patent grafts to all vessels with small vessel disease  . CARDIAC CATHETERIZATION  07/07/2005   patent grafts, EF 45-50%  . CARPAL TUNNEL RELEASE Right   . CATARACT EXTRACTION W/ INTRAOCULAR LENS  IMPLANT, BILATERAL    . CHOLECYSTECTOMY    . COLONOSCOPY N/A 03/31/2017   Procedure: COLONOSCOPY;  Surgeon: Carol Ada, MD;  Location: [facility];  Service: Endoscopy;  Laterality: N/A;  . CORONARY ANGIOPLASTY WITH STENT PLACEMENT  05/22/2002   3.0x9m cypher stent covered the proximal and distal portion of  RIMA, final inflation -16x60  . [place]   only LIMA-LAD patent  . CYSTOSCOPY W/ STONE MANIPULATION  2013  . CYSTOSCOPY/RETROGRADE/URETEROSCOPY/STONE EXTRACTION WITH BASKET  02/16/2012   Procedure: CYSTOSCOPY/RETROGRADE/URETEROSCOPY/STONE EXTRACTION WITH BASKET;  Surgeon: JMalka So MD;  Location: [facility];  Service: Urology;  Laterality: Left;  Left Ureterscopy with Stone Extraction  . DOPPLER ECHOCARDIOGRAPHY  07/09/2013   EF 65-78 (up from 50-55% in 2013), minimally increased transvalvular velocity across the aortic valve suggestive of very mild stenosis to simply sclerosis. Normal diastolic indices  . DOPPLER ECHOCARDIOGRAPHY    . LATERAL FUSION LUMBAR SPINE  04/16/2014  . NM MYOVIEW LTD  02/2013   no evidence of ischemia or infarct, septal hypokinesis/dyskinesis   . REDO - CORONARY ARTERY BYPASS GRAFT  2000    previous LIMA to LAD patent; seqSVG - OM 2, OM 3, SVG-D3, SVG-OM1, fRIMA-rPDA  . TONSILLECTOMY      Social History   Socioeconomic History  . Marital status: Married    Spouse name: Not on file  . Number of children: 3  . Years of education: Not on file  . Highest education level: Not on file  Occupational History  . Not on file  Tobacco Use  . Smoking status: Never Smoker  . Smokeless tobacco: Never Used  Substance and Sexual Activity  . Alcohol use: No  . Drug use: No  . Sexual activity: Not Currently  Other Topics Concern  . Not on file  Social History Narrative   He is a married father of three, grandfather of nine, great-grandfather of four. He usually walks   daily about 20 minutes a day - but not recently due to back pain. Does not currently smoke and does not drink.    Social Determinants of Health   Financial Resource Strain:   . Difficulty of Paying Living Expenses: Not on file  Food Insecurity:   . Worried About RCharity fundraiserin the Last Year: Not on file  . Ran Out of Food in the Last Year: Not on file    Transportation Needs:   . Lack of Transportation (Medical): Not on file  . Lack of Transportation (Non-Medical): Not on file  Physical Activity:   . Days of Exercise per Week: Not on file  . Minutes of Exercise per Session: Not on file  Stress:   . Feeling of Stress : Not on file  Social Connections:   . Frequency of Communication with Friends and Family: Not on file  . Frequency of Social Gatherings with Friends and Family: Not on file  . Attends Religious Services: Not on file  . Active Member of Clubs or Organizations: Not on file  . Attends CArchivistMeetings: Not on file  .  Marital Status: Not on file  Intimate Partner Violence:   . Fear of Current or Ex-Partner: Not on file  . Emotionally Abused: Not on file  . Physically Abused: Not on file  . Sexually Abused: Not on file   Review of Systems  Constitution: Negative for chills, decreased appetite, malaise/fatigue and weight gain.  Cardiovascular: Positive for chest pain and claudication (from back pain). Negative for dyspnea on exertion, leg swelling and syncope.  Endocrine: Negative for cold intolerance.  Hematologic/Lymphatic: Does not bruise/bleed easily.  Musculoskeletal: Positive for back pain. Negative for joint swelling.  Gastrointestinal: Negative for abdominal pain, anorexia, change in bowel habit, hematochezia and melena.  Neurological: Negative for headaches and light-headedness.  Psychiatric/Behavioral: Negative for depression and substance abuse.  All other systems reviewed and are negative.  Objective  Blood pressure (!) 143/64, pulse (!) 55, height 5' 6"  (1.676 m), weight 200 lb (90.7 kg), SpO2 98 %. Body mass index is 32.28 kg/m.  Physical exam not performed or limited due to virtual visit.   Please see exam details from prior visit is as below.  Physical Exam  Constitutional: He appears well-developed. No distress.  Mildly obese  HENT:  Head: Atraumatic.  Eyes: Conjunctivae are normal.   Neck: No JVD present. No thyromegaly present.  Cardiovascular: Normal rate, regular rhythm, normal heart sounds and intact distal pulses. Exam reveals no gallop.  No murmur heard. No edema.   Pulmonary/Chest: Effort normal and breath sounds normal.  Abdominal: Soft. Bowel sounds are normal.  Musculoskeletal:        General: Normal range of motion.     Cervical back: Neck supple.  Neurological: He is alert.  Skin: Skin is warm and dry.  Psychiatric: He has a normal mood and affect.   Radiology: No results found.  Laboratory examination:   Labs 04/16/2019: Vitamin B12 2000.  Serum glucose 1 1 6  mg, BUN 40, creatinine 2.6, EGFR 24 mL, potassium 4.6, CMP otherwise normal.  Hb 11.8/HCT 35.8, platelets 180.  Total cholesterol 103, triglycerides 114, HDL 36, LDL 44.  Non-HDL cholesterol 67.  TSH normal.  CMP Latest Ref Rng & Units 12/01/2018 12/01/2018 04/18/2014  Glucose 70 - 99 mg/dL 110(H) 111(H) 225(H)  BUN 8 - 23 mg/dL 37(H) 35(H) 36(H)  Creatinine 0.61 - 1.24 mg/dL 2.22(H) 2.50(H) 2.08(H)  Sodium 135 - 145 mmol/L 139 140 135(L)  Potassium 3.5 - 5.1 mmol/L 4.6 5.2(H) 4.6  Chloride 98 - 111 mmol/L 102 102 98  CO2 22 - 32 mmol/L 29 - 24  Calcium 8.9 - 10.3 mg/dL 8.8(L) - 8.4  Total Protein 6.0 - 8.3 g/dL - - -  Total Bilirubin 0.3 - 1.2 mg/dL - - -  Alkaline Phos 39 - 117 U/L - - -  AST 0 - 37 U/L - - -  ALT 0 - 53 U/L - - -   CBC Latest Ref Rng & Units 12/01/2018 04/18/2014 04/17/2014  WBC 4.0 - 10.5 K/uL - 11.7(H) 12.2(H)  Hemoglobin 13.0 - 17.0 g/dL 12.6(L) 10.4(L) 10.7(L)  Hematocrit 39.0 - 52.0 % 37.0(L) 30.7(L) 31.4(L)  Platelets 150 - 400 K/uL - 188 216   Lipid Panel     Component Value Date/Time   CHOL 96 03/17/2013 0800   TRIG 75 03/17/2013 0800   HDL 51 03/17/2013 0800   CHOLHDL 1.9 03/17/2013 0800   VLDL 15 03/17/2013 0800   LDLCALC 30 03/17/2013 0800   HEMOGLOBIN A1C Lab Results  Component Value Date   HGBA1C 7.4 (H) 04/16/2014  MPG 166 (H) 04/16/2014    TSH No results for input(s): TSH in the last 8760 hours.  Medications   Current Outpatient Medications  Medication Instructions  . albuterol (PROAIR HFA) 108 (90 Base) MCG/ACT inhaler 2 puffs, Inhalation, Every 6 hours PRN  . amLODipine (NORVASC) 10 mg, Oral, Daily  . aspirin EC 81 mg, Oral, Daily  . carvedilol (COREG) 6.25 mg, Oral, 2 times daily  . cyanocobalamin ((VITAMIN B-12)) 1,000 mcg, Intramuscular, Every 30 days  . Dulaglutide (TRULICITY) 1.5 mg, Subcutaneous, Every Sat  . Febuxostat (ULORIC) 80 mg, Oral, Daily  . furosemide (LASIX) 40 mg, Oral, Daily  . HYDROcodone-acetaminophen (NORCO/VICODIN) 5-325 MG tablet 1 tablet, Oral, Every 6 hours PRN  . Insulin Glargine (TOUJEO SOLOSTAR) 26 Units, Subcutaneous, Daily  . insulin lispro (HUMALOG KWIKPEN) 12 Units, Subcutaneous, 2 times daily  . Melatonin 5 mg, Sublingual, Daily at bedtime  . olmesartan (BENICAR) 20 mg, Oral, Daily  . omeprazole (PRILOSEC) 40 mg, Oral, Daily  . pregabalin (LYRICA) 75 mg, Oral, 3 times daily  . rosuvastatin (CRESTOR) 20 mg, Oral, Daily  . tadalafil (CIALIS) 5 mg, Oral, Daily at bedtime  . ticagrelor (BRILINTA) 90 mg, Oral, 2 times daily  . Vitamin D3 5,000 Units, Oral, Daily  . zolpidem (AMBIEN) 10 mg, Oral, Daily at bedtime    Cardiac Studies:   ABI 12/29/2017: Unable to obtain due to medial calcinosis.  Bilateral toe brachial indices are normal.  Echocardiogram  11/30/2018: 1. Poor echo window, wall motion with reduced sensitivity. Left ventricle cavity is normal in size. Normal left ventricular shape. Abnormal septal wall motion due to post-operative coronary artery bypass graft. Doppler evidence of grade II (pseudonormal) diastolic dysfunction. Diastolic dysfunction findings suggests elevated LA/LV end diastolic pressure. Calculated EF 56%. 2. Mild (Grade I) mitral regurgitation. 3. Mild to moderate tricuspid regurgitation. Mild pulmonary hypertension. Estimated pulmonary artery systolic  pressure 32 mmHg.  Ech[facility]ogram At [facility], [place]/2020: Normal LV systolic function EF 15%, grade 1 diastolic dysfunction.  No significant valvular abnormality.  Coronary angiogram 11/13/2019 at [facility], [place]: Distal left main 80% stenosed, LAD occluded proximally, LIMA to LAD patent.  Left circumflex is small.  SVG to OM 2 and 13 has large thrombus burden and is occluded just before OM 3.  S/P thrombectomy followed by 3.5 x 38 mm Synergy and distally 2.25 x 12 mm Synergy DES. RCA is codominant.  There are tandem 80% stenosis in the mid and distal vessel.  Right PDA is chronically occluded.  SVG to RCA has a 70% proximal stenosis prior to a widely patent stent in the midportion of the graft.  Distal to the stent 80% tandem stenosis with good runoff.  Assessment     ICD-10-CM   1. Coronary artery disease of bypass graft of native heart with stable angina pectoris (HCC)  I25.708 EKG 12-Lead  2. Coronary artery disease of native artery of native heart with stable angina pectoris ([place])  I25.118   3. Essential hypertension  I10   4. CKD (chronic kidney disease) stage 4, GFR 15-29 ml/min (HCC)  N18.4   5. ST elevation myocardial infarction involving left circumflex coronary artery (HCC)  I21.21 ticagrelor (BRILINTA) 90 MG TABS tablet  6. Hypercholesteremia  E78.00 rosuvastatin (CRESTOR) 20 MG tablet    EKG 11/29/2019: Normal sinus rhythm at rate of 60 bpm, left atrial abnormality, normal axis.  T wave abnormality, lateral ischemia.  Single PVC.  Compared to 11/19/2018, lateral T wave abnormality  new.  Recommendations:   SUSIE POUSSON  is a 78 y.o. Caucasian male   with type 2 diabetes, stage IV CKD being followed by Dr. Elmarie Shiley, diabetic peripheral neuropathy, diabetic retinopathy, hypertension, hyperlipidemia, Chronic back pain and digital joint disease and peripheral neuropathy, CAD and S/P CABG 1991 followed by redo CABG in 2003 due  to occluded bypass graft except free RIMA to LAD,  MI in 2006 needing stent to free [place] graft to LAD, Again admitted with acute inferior and lateral STEMI in [place] on 11/13/2019, S/P stenting to SVG to OM 2 and also to native distal OM 2 with implantation of 2 overlapping DES and has high grade residual SVG to RCA disease and probably needs repeat cath and PCI.  I will obtain CD for the angiogram and decide on his repeat procedure in view of  CKD stage 4.  I will discontinue Plavix and switch him to Brilinta 90 mg p.o. twice daily.  I will also increase Crestor 10 to 20 mg.  I would like to see him back in 4 weeks for follow-up.    Adrian Prows, MD, [facility] 12/01/2019, 9:45 AM [facility] [place] Pager: [phone] Office: [phone]

## 2019-12-21 ENCOUNTER — Other Ambulatory Visit: Payer: Self-pay | Admitting: Cardiology

## 2019-12-30 ENCOUNTER — Ambulatory Visit: Payer: Medicare Other | Admitting: Cardiology

## 2020-01-01 ENCOUNTER — Telehealth: Payer: Self-pay

## 2020-01-01 NOTE — Telephone Encounter (Signed)
Okay 

## 2020-01-01 NOTE — Telephone Encounter (Signed)
Patient called and said that he has had a reaction to the Brilinta and he has stopped taking it as of yesterday and went back on the plavix. He said he was SOB/no energy, and a burning in his chest.

## 2020-01-09 ENCOUNTER — Other Ambulatory Visit: Payer: Self-pay

## 2020-01-09 ENCOUNTER — Inpatient Hospital Stay (HOSPITAL_COMMUNITY)
Admission: EM | Admit: 2020-01-09 | Discharge: 2020-01-11 | DRG: 302 | Disposition: A | Discharge status: Home / Self Care | Payer: Medicare PPO | Attending: Family Medicine | Admitting: Family Medicine

## 2020-01-09 ENCOUNTER — Emergency Department (HOSPITAL_COMMUNITY): Payer: Medicare PPO

## 2020-01-09 ENCOUNTER — Encounter (HOSPITAL_COMMUNITY): Payer: Self-pay | Admitting: Internal Medicine

## 2020-01-09 DIAGNOSIS — I25810 Atherosclerosis of coronary artery bypass graft(s) without angina pectoris: Secondary | ICD-10-CM | POA: Diagnosis present

## 2020-01-09 DIAGNOSIS — G8929 Other chronic pain: Secondary | ICD-10-CM | POA: Diagnosis present

## 2020-01-09 DIAGNOSIS — I10 Essential (primary) hypertension: Secondary | ICD-10-CM | POA: Diagnosis present

## 2020-01-09 DIAGNOSIS — I2510 Atherosclerotic heart disease of native coronary artery without angina pectoris: Secondary | ICD-10-CM | POA: Diagnosis present

## 2020-01-09 DIAGNOSIS — N184 Chronic kidney disease, stage 4 (severe): Secondary | ICD-10-CM | POA: Diagnosis not present

## 2020-01-09 DIAGNOSIS — M545 Low back pain: Secondary | ICD-10-CM | POA: Diagnosis present

## 2020-01-09 DIAGNOSIS — I200 Unstable angina: Secondary | ICD-10-CM | POA: Diagnosis not present

## 2020-01-09 DIAGNOSIS — Z981 Arthrodesis status: Secondary | ICD-10-CM

## 2020-01-09 DIAGNOSIS — I25110 Atherosclerotic heart disease of native coronary artery with unstable angina pectoris: Secondary | ICD-10-CM | POA: Diagnosis not present

## 2020-01-09 DIAGNOSIS — R0600 Dyspnea, unspecified: Secondary | ICD-10-CM

## 2020-01-09 DIAGNOSIS — E1122 Type 2 diabetes mellitus with diabetic chronic kidney disease: Secondary | ICD-10-CM | POA: Diagnosis not present

## 2020-01-09 DIAGNOSIS — R079 Chest pain, unspecified: Secondary | ICD-10-CM

## 2020-01-09 DIAGNOSIS — K224 Dyskinesia of esophagus: Secondary | ICD-10-CM | POA: Diagnosis present

## 2020-01-09 DIAGNOSIS — Z20822 Contact with and (suspected) exposure to covid-19: Secondary | ICD-10-CM | POA: Diagnosis present

## 2020-01-09 DIAGNOSIS — Z66 Do not resuscitate: Secondary | ICD-10-CM | POA: Diagnosis present

## 2020-01-09 DIAGNOSIS — E785 Hyperlipidemia, unspecified: Secondary | ICD-10-CM | POA: Diagnosis present

## 2020-01-09 DIAGNOSIS — E1151 Type 2 diabetes mellitus with diabetic peripheral angiopathy without gangrene: Secondary | ICD-10-CM | POA: Diagnosis present

## 2020-01-09 DIAGNOSIS — R0789 Other chest pain: Secondary | ICD-10-CM | POA: Diagnosis not present

## 2020-01-09 DIAGNOSIS — I739 Peripheral vascular disease, unspecified: Secondary | ICD-10-CM | POA: Diagnosis present

## 2020-01-09 DIAGNOSIS — T508X5A Adverse effect of diagnostic agents, initial encounter: Secondary | ICD-10-CM | POA: Diagnosis present

## 2020-01-09 DIAGNOSIS — E1142 Type 2 diabetes mellitus with diabetic polyneuropathy: Secondary | ICD-10-CM | POA: Diagnosis present

## 2020-01-09 DIAGNOSIS — H5462 Unqualified visual loss, left eye, normal vision right eye: Secondary | ICD-10-CM | POA: Diagnosis present

## 2020-01-09 DIAGNOSIS — Z7902 Long term (current) use of antithrombotics/antiplatelets: Secondary | ICD-10-CM

## 2020-01-09 DIAGNOSIS — I2582 Chronic total occlusion of coronary artery: Secondary | ICD-10-CM | POA: Diagnosis present

## 2020-01-09 DIAGNOSIS — I129 Hypertensive chronic kidney disease with stage 1 through stage 4 chronic kidney disease, or unspecified chronic kidney disease: Secondary | ICD-10-CM

## 2020-01-09 DIAGNOSIS — E669 Obesity, unspecified: Secondary | ICD-10-CM | POA: Diagnosis present

## 2020-01-09 DIAGNOSIS — E119 Type 2 diabetes mellitus without complications: Secondary | ICD-10-CM

## 2020-01-09 DIAGNOSIS — I5033 Acute on chronic diastolic (congestive) heart failure: Secondary | ICD-10-CM | POA: Diagnosis present

## 2020-01-09 DIAGNOSIS — I130 Hypertensive heart and chronic kidney disease with heart failure and stage 1 through stage 4 chronic kidney disease, or unspecified chronic kidney disease: Secondary | ICD-10-CM | POA: Diagnosis present

## 2020-01-09 DIAGNOSIS — E11649 Type 2 diabetes mellitus with hypoglycemia without coma: Secondary | ICD-10-CM | POA: Diagnosis not present

## 2020-01-09 DIAGNOSIS — N179 Acute kidney failure, unspecified: Secondary | ICD-10-CM

## 2020-01-09 DIAGNOSIS — I252 Old myocardial infarction: Secondary | ICD-10-CM

## 2020-01-09 DIAGNOSIS — Z6833 Body mass index (BMI) 33.0-33.9, adult: Secondary | ICD-10-CM

## 2020-01-09 DIAGNOSIS — Z794 Long term (current) use of insulin: Secondary | ICD-10-CM

## 2020-01-09 DIAGNOSIS — E66811 Obesity, class 1: Secondary | ICD-10-CM | POA: Diagnosis present

## 2020-01-09 DIAGNOSIS — Z7982 Long term (current) use of aspirin: Secondary | ICD-10-CM

## 2020-01-09 LAB — SARS CORONAVIRUS 2 (TAT 6-24 HRS): SARS Coronavirus 2: NEGATIVE

## 2020-01-09 LAB — BASIC METABOLIC PANEL
Anion gap: 12 (ref 5–15)
BUN: 68 mg/dL — ABNORMAL HIGH (ref 8–23)
CO2: 22 mmol/L (ref 22–32)
Calcium: 9 mg/dL (ref 8.9–10.3)
Chloride: 108 mmol/L (ref 98–111)
Creatinine, Ser: 3.58 mg/dL — ABNORMAL HIGH (ref 0.61–1.24)
GFR calc Af Amer: 18 mL/min — ABNORMAL LOW (ref >60)
GFR calc non Af Amer: 15 mL/min — ABNORMAL LOW (ref >60)
Glucose, Bld: 88 mg/dL (ref 70–99)
Potassium: 4.8 mmol/L (ref 3.5–5.1)
Sodium: 142 mmol/L (ref 135–145)

## 2020-01-09 LAB — GLUCOSE, CAPILLARY
Glucose-Capillary: 115 mg/dL — ABNORMAL HIGH (ref 70–99)
Glucose-Capillary: 71 mg/dL (ref 70–99)

## 2020-01-09 LAB — HEMOGLOBIN A1C
Hgb A1c MFr Bld: 6.1 % — ABNORMAL HIGH (ref 4.8–5.6)
Mean Plasma Glucose: 128.37 mg/dL

## 2020-01-09 LAB — TROPONIN I (HIGH SENSITIVITY)
Troponin I (High Sensitivity): 19 ng/L — ABNORMAL HIGH (ref <18)
Troponin I (High Sensitivity): 20 ng/L — ABNORMAL HIGH (ref <18)

## 2020-01-09 LAB — CBC
HCT: 36.5 % — ABNORMAL LOW (ref 39.0–52.0)
Hemoglobin: 11.8 g/dL — ABNORMAL LOW (ref 13.0–17.0)
MCH: 31.9 pg (ref 26.0–34.0)
MCHC: 32.3 g/dL (ref 30.0–36.0)
MCV: 98.6 fL (ref 80.0–100.0)
Platelets: 203 10*3/uL (ref 150–400)
RBC: 3.7 MIL/uL — ABNORMAL LOW (ref 4.22–5.81)
RDW: 12.8 % (ref 11.5–15.5)
WBC: 9.3 10*3/uL (ref 4.0–10.5)
nRBC: 0 % (ref 0.0–0.2)

## 2020-01-09 LAB — BRAIN NATRIURETIC PEPTIDE: B Natriuretic Peptide: 201.8 pg/mL — ABNORMAL HIGH (ref 0.0–100.0)

## 2020-01-09 LAB — D-DIMER, QUANTITATIVE: D-Dimer, Quant: 1.41 ug/mL-FEU — ABNORMAL HIGH (ref 0.00–0.50)

## 2020-01-09 MED ORDER — INSULIN GLARGINE 100 UNIT/ML ~~LOC~~ SOLN
24.0000 [IU] | Freq: Every day | SUBCUTANEOUS | Status: DC
  Administered 2020-01-09 – 2020-01-10 (×2): 24 [IU] via SUBCUTANEOUS
  Filled 2020-01-09 (×3): qty 0.24

## 2020-01-09 MED ORDER — ROSUVASTATIN CALCIUM 20 MG PO TABS
20.0000 mg | ORAL_TABLET | Freq: Every day | ORAL | Status: DC
  Administered 2020-01-09: 20 mg via ORAL
  Filled 2020-01-09: qty 1

## 2020-01-09 MED ORDER — ZOLPIDEM TARTRATE 5 MG PO TABS
5.0000 mg | ORAL_TABLET | Freq: Every day | ORAL | Status: DC
  Administered 2020-01-09 – 2020-01-10 (×2): 5 mg via ORAL
  Filled 2020-01-09 (×2): qty 1

## 2020-01-09 MED ORDER — SODIUM CHLORIDE 0.9% FLUSH
3.0000 mL | Freq: Two times a day (BID) | INTRAVENOUS | Status: DC
  Administered 2020-01-09 – 2020-01-11 (×4): 3 mL via INTRAVENOUS

## 2020-01-09 MED ORDER — SODIUM CHLORIDE 0.9 % IV SOLN: 250.0000 mL | INTRAVENOUS | Status: DC | PRN

## 2020-01-09 MED ORDER — PANTOPRAZOLE SODIUM 40 MG PO TBEC
80.0000 mg | DELAYED_RELEASE_TABLET | Freq: Every day | ORAL | Status: DC
  Administered 2020-01-09 – 2020-01-11 (×3): 80 mg via ORAL
  Filled 2020-01-09 (×3): qty 2

## 2020-01-09 MED ORDER — FUROSEMIDE 40 MG PO TABS
40.0000 mg | ORAL_TABLET | Freq: Every day | ORAL | Status: DC
  Administered 2020-01-09: 40 mg via ORAL
  Filled 2020-01-09: qty 1

## 2020-01-09 MED ORDER — TICAGRELOR 90 MG PO TABS
90.0000 mg | ORAL_TABLET | Freq: Two times a day (BID) | ORAL | Status: DC
  Administered 2020-01-09 – 2020-01-11 (×5): 90 mg via ORAL
  Filled 2020-01-09 (×5): qty 1

## 2020-01-09 MED ORDER — VITAMIN D 25 MCG (1000 UNIT) PO TABS
5000.0000 [IU] | ORAL_TABLET | Freq: Every day | ORAL | Status: DC
  Administered 2020-01-09 – 2020-01-11 (×3): 5000 [IU] via ORAL
  Filled 2020-01-09 (×3): qty 5

## 2020-01-09 MED ORDER — INSULIN ASPART 100 UNIT/ML ~~LOC~~ SOLN
0.0000 [IU] | Freq: Three times a day (TID) | SUBCUTANEOUS | Status: DC
  Administered 2020-01-10: 2 [IU] via SUBCUTANEOUS

## 2020-01-09 MED ORDER — SODIUM CHLORIDE 0.9 % IV SOLN: INTRAVENOUS | Status: DC

## 2020-01-09 MED ORDER — CARVEDILOL 6.25 MG PO TABS
6.2500 mg | ORAL_TABLET | Freq: Two times a day (BID) | ORAL | Status: DC
  Administered 2020-01-09 – 2020-01-11 (×5): 6.25 mg via ORAL
  Filled 2020-01-09 (×5): qty 1

## 2020-01-09 MED ORDER — AMLODIPINE BESYLATE 10 MG PO TABS
10.0000 mg | ORAL_TABLET | Freq: Every day | ORAL | Status: DC
  Administered 2020-01-09 – 2020-01-11 (×3): 10 mg via ORAL
  Filled 2020-01-09 (×3): qty 1

## 2020-01-09 MED ORDER — SODIUM CHLORIDE 0.9 % IV BOLUS
500.0000 mL | Freq: Once | INTRAVENOUS | Status: AC
  Administered 2020-01-09: 500 mL via INTRAVENOUS

## 2020-01-09 MED ORDER — ENOXAPARIN SODIUM 30 MG/0.3ML ~~LOC~~ SOLN
30.0000 mg | SUBCUTANEOUS | Status: DC
  Administered 2020-01-09: 30 mg via SUBCUTANEOUS
  Filled 2020-01-09: qty 0.3

## 2020-01-09 MED ORDER — PREGABALIN 75 MG PO CAPS
75.0000 mg | ORAL_CAPSULE | Freq: Two times a day (BID) | ORAL | Status: DC
  Administered 2020-01-10 – 2020-01-11 (×3): 75 mg via ORAL
  Filled 2020-01-09 (×3): qty 1

## 2020-01-09 MED ORDER — CYANOCOBALAMIN 1000 MCG/ML IJ SOLN: 1000.0000 ug | INTRAMUSCULAR | Status: DC

## 2020-01-09 MED ORDER — ROSUVASTATIN CALCIUM 5 MG PO TABS
10.0000 mg | ORAL_TABLET | Freq: Every day | ORAL | Status: DC
  Administered 2020-01-10 – 2020-01-11 (×2): 10 mg via ORAL
  Filled 2020-01-09 (×2): qty 2

## 2020-01-09 MED ORDER — NITROGLYCERIN 0.4 MG SL SUBL: 0.4000 mg | SUBLINGUAL_TABLET | SUBLINGUAL | Status: DC | PRN

## 2020-01-09 MED ORDER — INSULIN ASPART 100 UNIT/ML ~~LOC~~ SOLN
12.0000 [IU] | Freq: Two times a day (BID) | SUBCUTANEOUS | Status: DC
  Administered 2020-01-09 – 2020-01-10 (×2): 12 [IU] via SUBCUTANEOUS

## 2020-01-09 MED ORDER — PREGABALIN 75 MG PO CAPS
75.0000 mg | ORAL_CAPSULE | Freq: Three times a day (TID) | ORAL | Status: DC
  Administered 2020-01-09: 75 mg via ORAL
  Filled 2020-01-09: qty 1

## 2020-01-09 MED ORDER — ACETAMINOPHEN 325 MG PO TABS: 650.0000 mg | ORAL_TABLET | ORAL | Status: DC | PRN

## 2020-01-09 MED ORDER — SODIUM CHLORIDE 0.9% FLUSH: 3.0000 mL | INTRAVENOUS | Status: DC | PRN

## 2020-01-09 MED ORDER — INSULIN LISPRO 100 UNIT/ML (KWIKPEN): 12.0000 [IU] | PEN_INJECTOR | Freq: Two times a day (BID) | SUBCUTANEOUS | Status: DC

## 2020-01-09 MED ORDER — SODIUM CHLORIDE 0.9 % IV SOLN: INTRAVENOUS | Status: AC

## 2020-01-09 MED ORDER — MELATONIN 3 MG PO TABS
6.0000 mg | ORAL_TABLET | Freq: Every day | ORAL | Status: DC
  Administered 2020-01-09 – 2020-01-10 (×2): 6 mg via SUBLINGUAL
  Filled 2020-01-09 (×3): qty 2

## 2020-01-09 MED ORDER — ONDANSETRON HCL 4 MG/2ML IJ SOLN: 4.0000 mg | Freq: Four times a day (QID) | INTRAMUSCULAR | Status: DC | PRN

## 2020-01-09 MED ORDER — FEBUXOSTAT 40 MG PO TABS
80.0000 mg | ORAL_TABLET | Freq: Every day | ORAL | Status: DC
  Administered 2020-01-09 – 2020-01-11 (×3): 80 mg via ORAL
  Filled 2020-01-09 (×4): qty 2

## 2020-01-09 MED ORDER — HYDROCODONE-ACETAMINOPHEN 5-325 MG PO TABS: 1.0000 | ORAL_TABLET | Freq: Four times a day (QID) | ORAL | Status: DC | PRN

## 2020-01-09 MED ORDER — CLOPIDOGREL BISULFATE 75 MG PO TABS: 75.0000 mg | ORAL_TABLET | Freq: Every day | ORAL | Status: DC

## 2020-01-09 MED ORDER — ASPIRIN EC 81 MG PO TBEC
81.0000 mg | DELAYED_RELEASE_TABLET | Freq: Every day | ORAL | Status: DC
  Administered 2020-01-09 – 2020-01-11 (×3): 81 mg via ORAL
  Filled 2020-01-09 (×3): qty 1

## 2020-01-09 MED ORDER — INSULIN ASPART 100 UNIT/ML ~~LOC~~ SOLN: 0.0000 [IU] | Freq: Every day | SUBCUTANEOUS | Status: DC

## 2020-01-09 MED ORDER — ALBUTEROL SULFATE (2.5 MG/3ML) 0.083% IN NEBU: 3.0000 mL | INHALATION_SOLUTION | Freq: Four times a day (QID) | RESPIRATORY_TRACT | Status: DC | PRN

## 2020-01-09 MED ORDER — IRBESARTAN 300 MG PO TABS
300.0000 mg | ORAL_TABLET | Freq: Every day | ORAL | Status: DC
  Filled 2020-01-09: qty 1

## 2020-01-09 NOTE — Consult Note (Addendum)
[facility]  HISTORY AND PHYSICAL  Allen Medina is an 79 y.o. male.    Chief Complaint: chest pain  HPI: 22M with a PMH sig for HTN, HLD, DM II, CAD s/p CABG and then redo, and a recent MI in [place] over Thanksgiving is now seen in consultation at the request of Dr. Evangeline Gula for eval and recs re: presumed AKI on CKD.  Pt had a STEMI 11/25 and was taken emergently to cath lab where he had successful PCI of SVG to OM2 and native OM2 with 2 overlapping stents.  Was discharged on Brilinta.  Had SOB starting about a week ago- says he switched from [place] back to Plavix at that time.  SOB got worse and he presented to ED today.  Was given NTG which helped.  Cr was up to 3.58 from prior values.  I don't have any values from prior hospitalization in [facility] yet.  Last seen in our clinic 02/2019, had stage 3b/IV CKD then- Cr 02/26/2019 2.10 and then 04/2019 was 2.27.   Review of record indicates that he is set to undergo cath tomorrow.  On olmesartan at home.  No NSAIDs.  Has been taking Lasix as directed.  Noted that weight has been trending upwards.     PMH: Past Medical History:  Diagnosis Date  . Arthritis    "hands" (04/16/2014)  . Blindness of left eye   . CAD (coronary artery disease) of bypass graft 2000   Occluded grafts --> redo CABG  . CAD, multiple vessel 1991   last cath 2006-patent grafts, last nuc 09/10/09-no ischemia  . Chronic lower back pain   . CKD (chronic kidney disease) stage 4, GFR 15-29 ml/min (HCC)    kidney stones,cmet 02/09/12, 02/13/12 on chart  . Claudication ([place])    LE dopplers 03/17/10, normal ABIs, normal pressures  . Diabetes mellitus type 2 with complications (HCC)    renal insuff, HTN, CAD, PVD  . History of blood transfusion 2010   "after back OR" (04/16/2014)  . HTN (hypertension)    difficult to control  . Hx of myocardial infarction   . Hyperlipidemia LDL goal < 100   . Kidney stones   . Myocardial infarction ([place]) 2000   LOV  with EKG Dr Rex Kras 7/12 on chart, eccho 10/03, stress test 9/10 on chart, chest x ray 2/13 EPIC, chest CT 02/13/12 on chart  . Neuropathy in diabetes ([place])   . Peripheral vascular disease ([place])    peripheral neuropathy, claudication  . Pneumonia    hx of  . S/P CABG x 4 2000   SVG-OM1, SVG-OM 2-OM 3, SVG-diagonal, free RIMA-RPDA ( has stent in the vessel from 2003)-  . S/P CABG x 5    only LIMA-LAD still patent  . Spinal stenosis   . Thin skin   . Vitamin B 12 deficiency    PSH: Past Surgical History:  Procedure Laterality Date  . ANTERIOR LAT LUMBAR FUSION N/A 04/16/2014   Procedure: Lateral  Lumbar Two-Three/Three-Four Interbody and Fusion with Lateral Pedicle Screw Fixation Lumbar Two-Four;  Surgeon: Melina Schools, MD;  Location: [facility];  Service: Orthopedics;  Laterality: N/A;  . BACK SURGERY  2009, 2010  . CARDIAC CATHETERIZATION  06/04/1990   severe 3 vessel dz  . CARDIAC CATHETERIZATION  01/13/97   patent grafts, nl LV function  . CARDIAC CATHETERIZATION  01/28/1999   severe native CAD with disease involving all 3 SVG, recommend redo CABG  . CARDIAC CATHETERIZATION  05/16/2002   patent graft except of mod to sever stenosis in the midprotion of the RIMA to the posterior descending artery  . CARDIAC CATHETERIZATION  08/28/2003   nl lv fxn, patent grafts to all vessels with small vessel disease  . CARDIAC CATHETERIZATION  07/07/2005   patent grafts, EF 45-50%  . CARPAL TUNNEL RELEASE Right   . CATARACT EXTRACTION W/ INTRAOCULAR LENS  IMPLANT, BILATERAL    . CHOLECYSTECTOMY    . COLONOSCOPY N/A 03/31/2017   Procedure: COLONOSCOPY;  Surgeon: Carol Ada, MD;  Location: [facility];  Service: Endoscopy;  Laterality: N/A;  . CORONARY ANGIOPLASTY WITH STENT PLACEMENT  05/22/2002   3.0x27mm cypher stent covered the proximal and distal portion of RIMA, final inflation -16x60  . [place] & 2000   only LIMA-LAD patent  . CYSTOSCOPY W/ STONE MANIPULATION   2013  . CYSTOSCOPY/RETROGRADE/URETEROSCOPY/STONE EXTRACTION WITH BASKET  02/16/2012   Procedure: CYSTOSCOPY/RETROGRADE/URETEROSCOPY/STONE EXTRACTION WITH BASKET;  Surgeon: Malka So, MD;  Location: [facility];  Service: Urology;  Laterality: Left;  Left Ureterscopy with Stone Extraction  . DOPPLER ECHOCARDIOGRAPHY  07/09/2013   EF 65-78 (up from 50-55% in 2013), minimally increased transvalvular velocity across the aortic valve suggestive of very mild stenosis to simply sclerosis. Normal diastolic indices  . DOPPLER ECHOCARDIOGRAPHY    . LATERAL FUSION LUMBAR SPINE  04/16/2014  . NM MYOVIEW LTD  02/2013   no evidence of ischemia or infarct, septal hypokinesis/dyskinesis   . REDO - CORONARY ARTERY BYPASS GRAFT  2000    previous LIMA to LAD patent; seqSVG - OM 2, OM 3, SVG-D3, SVG-OM1, fRIMA-rPDA  . TONSILLECTOMY      Past Medical History:  Diagnosis Date  . Arthritis    "hands" (04/16/2014)  . Blindness of left eye   . CAD (coronary artery disease) of bypass graft 2000   Occluded grafts --> redo CABG  . CAD, multiple vessel 1991   last cath 2006-patent grafts, last nuc 09/10/09-no ischemia  . Chronic lower back pain   . CKD (chronic kidney disease) stage 4, GFR 15-29 ml/min (HCC)    kidney stones,cmet 02/09/12, 02/13/12 on chart  . Claudication ([place])    LE dopplers 03/17/10, normal ABIs, normal pressures  . Diabetes mellitus type 2 with complications (HCC)    renal insuff, HTN, CAD, PVD  . History of blood transfusion 2010   "after back OR" (04/16/2014)  . HTN (hypertension)    difficult to control  . Hx of myocardial infarction   . Hyperlipidemia LDL goal < 100   . Kidney stones   . Myocardial infarction ([place]) 2000   LOV with EKG Dr Rex Kras 7/12 on chart, eccho 10/03, stress test 9/10 on chart, chest x ray 2/13 EPIC, chest CT 02/13/12 on chart  . Neuropathy in diabetes ([place])   . Peripheral vascular disease ([place])    peripheral neuropathy, claudication  . Pneumonia    hx of  . S/P CABG  x 4 2000   SVG-OM1, SVG-OM 2-OM 3, SVG-diagonal, free RIMA-RPDA ( has stent in the vessel from 2003)-  . S/P CABG x 5    only LIMA-LAD still patent  . Spinal stenosis   . Thin skin   . Vitamin B 12 deficiency     Medications:   Prior to Admission:  Medications Prior to Admission  Medication Sig Dispense Refill Last Dose  . albuterol (PROAIR HFA) 108 (90 Base) MCG/ACT inhaler Inhale 2 puffs into the lungs every 6 (six)  hours as needed for wheezing or shortness of breath.   unk  . amLODipine (NORVASC) 10 MG tablet Take 10 mg by mouth daily.   01/08/2020 at Unknown time  . aspirin EC 81 MG tablet Take 81 mg by mouth daily.   01/08/2020 at Unknown time  . carvedilol (COREG) 6.25 MG tablet Take 6.25 mg by mouth 2 (two) times daily.   01/08/2020 at 2100  . Cholecalciferol (VITAMIN D3) 5000 units CAPS Take 5,000 Units by mouth daily.   01/08/2020 at Unknown time  . clopidogrel (PLAVIX) 75 MG tablet Take 75 mg by mouth daily.   01/08/2020 at 0700  . cyanocobalamin (,VITAMIN B-12,) 1000 MCG/ML injection Inject 1,000 mcg into the muscle every 30 (thirty) days.    12/30/2019  . DM-Doxylamine-Acetaminophen (NYQUIL HBP COLD & FLU) 15-6.25-325 MG/15ML LIQD Take 15 mLs by mouth every 6 (six) hours as needed (cold symptoms).   01/08/2020 at Unknown time  . Dulaglutide (TRULICITY) 1.5 HE/1.7EY SOPN Inject 1.5 mg into the skin every Saturday.   01/04/2020  . Febuxostat (ULORIC) 80 MG TABS Take 80 mg by mouth daily.   01/08/2020  . furosemide (LASIX) 40 MG tablet Take 40 mg by mouth daily.   01/08/2020 at Unknown time  . HYDROcodone-acetaminophen (NORCO/VICODIN) 5-325 MG tablet Take 1 tablet by mouth every 6 (six) hours as needed. 14 tablet 0 unk  . Insulin Glargine (TOUJEO SOLOSTAR) 300 UNIT/ML SOPN Inject 24-26 Units into the skin daily.    01/08/2020 at Unknown time  . insulin lispro (HUMALOG KWIKPEN) 100 UNIT/ML KiwkPen Inject 12 Units into the skin 2 (two) times daily.    01/08/2020 at Unknown time  . Melatonin 5  MG SUBL Place 5 mg under the tongue at bedtime.    01/08/2020 at Unknown time  . nitroGLYCERIN (NITROSTAT) 0.4 MG SL tablet DISSOLVE 1 TABLET UNDER TONGUE EVERY 5 MINUTES UP TO 15 MIN FOR CHEST PAIN. IF NO RELIEF CALL 911. (Patient taking differently: Place 0.4 mg under the tongue every 5 (five) minutes as needed for chest pain. ) 25 tablet 0 01/09/2020 at Unknown time  . olmesartan (BENICAR) 40 MG tablet Take 20 mg by mouth daily.   01/08/2020 at Unknown time  . omeprazole (PRILOSEC) 40 MG capsule Take 40 mg by mouth daily.   01/08/2020 at Unknown time  . pregabalin (LYRICA) 75 MG capsule Take 75 mg by mouth 3 (three) times daily.   01/08/2020 at Unknown time  . rosuvastatin (CRESTOR) 20 MG tablet Take 1 tablet (20 mg total) by mouth daily. 90 tablet 3 01/08/2020 at Unknown time  . tadalafil (CIALIS) 5 MG tablet Take 5 mg by mouth at bedtime.    01/08/2020 at Unknown time  . zolpidem (AMBIEN) 10 MG tablet Take 10 mg by mouth at bedtime.   01/08/2020 at Unknown time  . ticagrelor (BRILINTA) 90 MG TABS tablet Take 1 tablet (90 mg total) by mouth 2 (two) times daily. (Patient not taking: Reported on 01/09/2020) 180 tablet 3 Not Taking at Unknown time    Medications Prior to Admission  Medication Sig Dispense Refill  . albuterol (PROAIR HFA) 108 (90 Base) MCG/ACT inhaler Inhale 2 puffs into the lungs every 6 (six) hours as needed for wheezing or shortness of breath.    Marland Kitchen amLODipine (NORVASC) 10 MG tablet Take 10 mg by mouth daily.    Marland Kitchen aspirin EC 81 MG tablet Take 81 mg by mouth daily.    . carvedilol (COREG) 6.25 MG tablet Take 6.25  mg by mouth 2 (two) times daily.    . Cholecalciferol (VITAMIN D3) 5000 units CAPS Take 5,000 Units by mouth daily.    . clopidogrel (PLAVIX) 75 MG tablet Take 75 mg by mouth daily.    . cyanocobalamin (,VITAMIN B-12,) 1000 MCG/ML injection Inject 1,000 mcg into the muscle every 30 (thirty) days.     Marland Kitchen DM-Doxylamine-Acetaminophen (NYQUIL HBP COLD & FLU) 15-6.25-325 MG/15ML LIQD  Take 15 mLs by mouth every 6 (six) hours as needed (cold symptoms).    . Dulaglutide (TRULICITY) 1.5 LS/9.3TD SOPN Inject 1.5 mg into the skin every Saturday.    . Febuxostat (ULORIC) 80 MG TABS Take 80 mg by mouth daily.    . furosemide (LASIX) 40 MG tablet Take 40 mg by mouth daily.    Marland Kitchen HYDROcodone-acetaminophen (NORCO/VICODIN) 5-325 MG tablet Take 1 tablet by mouth every 6 (six) hours as needed. 14 tablet 0  . Insulin Glargine (TOUJEO SOLOSTAR) 300 UNIT/ML SOPN Inject 24-26 Units into the skin daily.     . insulin lispro (HUMALOG KWIKPEN) 100 UNIT/ML KiwkPen Inject 12 Units into the skin 2 (two) times daily.     . Melatonin 5 MG SUBL Place 5 mg under the tongue at bedtime.     . nitroGLYCERIN (NITROSTAT) 0.4 MG SL tablet DISSOLVE 1 TABLET UNDER TONGUE EVERY 5 MINUTES UP TO 15 MIN FOR CHEST PAIN. IF NO RELIEF CALL 911. (Patient taking differently: Place 0.4 mg under the tongue every 5 (five) minutes as needed for chest pain. ) 25 tablet 0  . olmesartan (BENICAR) 40 MG tablet Take 20 mg by mouth daily.    Marland Kitchen omeprazole (PRILOSEC) 40 MG capsule Take 40 mg by mouth daily.    . pregabalin (LYRICA) 75 MG capsule Take 75 mg by mouth 3 (three) times daily.    . rosuvastatin (CRESTOR) 20 MG tablet Take 1 tablet (20 mg total) by mouth daily. 90 tablet 3  . tadalafil (CIALIS) 5 MG tablet Take 5 mg by mouth at bedtime.     Marland Kitchen zolpidem (AMBIEN) 10 MG tablet Take 10 mg by mouth at bedtime.    . ticagrelor (BRILINTA) 90 MG TABS tablet Take 1 tablet (90 mg total) by mouth 2 (two) times daily. (Patient not taking: Reported on 01/09/2020) 180 tablet 3    ALLERGIES:  No Known Allergies  FAM HX: No family history on file.  Social History:   reports that he has never smoked. He has never used smokeless tobacco. He reports that he does not drink alcohol or use drugs.  ROS: ROS: all other systems reviewed and are negative except as per HPI  Blood pressure 136/62, pulse 62, temperature 98 F (36.7 C),  temperature source Oral, resp. rate 20, SpO2 98 %. PHYSICAL EXAM: Physical Exam  GEN: NAD, sitting in bed, trying to eat HEENT EOMI PERRL  NECK no JVD upright PULM normal WOB, bibasilar crackles CV bigeminy ABD obese, NABS EXT 1+ LE edema NEURO AAO x 3 no asterixis SKIN no rashes/ lesions MSK no effusions   Results for orders placed or performed during the hospital encounter of 01/09/20 (from the past 48 hour(s))  Basic metabolic panel     Status: Abnormal   Collection Time: 01/09/20  8:31 AM  Result Value Ref Range   Sodium 142 135 - 145 mmol/L   Potassium 4.8 3.5 - 5.1 mmol/L   Chloride 108 98 - 111 mmol/L   CO2 22 22 - 32 mmol/L   Glucose, Bld 88 70 -  99 mg/dL   BUN 68 (H) 8 - 23 mg/dL   Creatinine, Ser 3.58 (H) 0.61 - 1.24 mg/dL   Calcium 9.0 8.9 - 10.3 mg/dL   GFR calc non Af Amer 15 (L) >60 mL/min   GFR calc Af Amer 18 (L) >60 mL/min   Anion gap 12 5 - 15    Comment: Performed at [facility] [address], [place], [place] [address]  CBC     Status: Abnormal   Collection Time: 01/09/20  8:31 AM  Result Value Ref Range   WBC 9.3 4.0 - 10.5 K/uL   RBC 3.70 (L) 4.22 - 5.81 MIL/uL   Hemoglobin 11.8 (L) 13.0 - 17.0 g/dL   HCT 36.5 (L) 39.0 - 52.0 %   MCV 98.6 80.0 - 100.0 fL   MCH 31.9 26.0 - 34.0 pg   MCHC 32.3 30.0 - 36.0 g/dL   RDW 12.8 11.5 - 15.5 %   Platelets 203 150 - 400 K/uL   nRBC 0.0 0.0 - 0.2 %    Comment: Performed at [facility], [place] [address], [place], [place] [address]  Troponin I (High Sensitivity)     Status: Abnormal   Collection Time: 01/09/20  8:31 AM  Result Value Ref Range   Troponin I (High Sensitivity) 19 (H) <18 ng/L    Comment: (NOTE) Elevated high sensitivity troponin I (hsTnI) values and significant  changes across serial measurements may suggest ACS but many other  chronic and acute conditions are known to elevate hsTnI results.  Refer to the "Links" section for chest pain algorithms and additional   guidance. Performed at [facility], [place] [address], [place], [place] [address]   Brain natriuretic peptide     Status: Abnormal   Collection Time: 01/09/20  8:47 AM  Result Value Ref Range   B Natriuretic Peptide 201.8 (H) 0.0 - 100.0 pg/mL    Comment: Performed at [facility] [address], [place], [place] [address]  D-dimer, quantitative     Status: Abnormal   Collection Time: 01/09/20  8:47 AM  Result Value Ref Range   D-Dimer, Quant 1.41 (H) 0.00 - 0.50 ug/mL-FEU    Comment: (NOTE) At the manufacturer cut-off of 0.50 ug/mL FEU, this assay has been documented to exclude PE with a sensitivity and negative predictive value of 97 to 99%.  At this time, this assay has not been approved by the FDA to exclude DVT/VTE. Results should be correlated with clinical presentation. Performed at [facility], [place] [address], [place], [place] [address]   Troponin I (High Sensitivity)     Status: Abnormal   Collection Time: 01/09/20 10:47 AM  Result Value Ref Range   Troponin I (High Sensitivity) 20 (H) <18 ng/L    Comment: (NOTE) Elevated high sensitivity troponin I (hsTnI) values and significant  changes across serial measurements may suggest ACS but many other  chronic and acute conditions are known to elevate hsTnI results.  Refer to the "Links" section for chest pain algorithms and additional  guidance. Performed at [facility], [place] [address] [place], [place] [address]     DG Chest Port 1 View  Result Date: 01/09/2020 CLINICAL DATA:  Chest pain EXAM: PORTABLE CHEST 1 VIEW COMPARISON:  04/08/2014 FINDINGS: Post CABG changes with multiple fractured median sternotomy wires, as seen on previous study. Heart size is within normal limits. No focal airspace consolidation, pleural effusion, or pneumothorax. IMPRESSION: No  acute cardiopulmonary findings. Electronically Signed   By: Davina Poke D.O.   On: 01/09/2020 08:59    Assessment/Plan  1.   Presumed AKI on CKD: Last values from our office (follows with Dr. Posey Pronto) 2.27 04/2019.  Presumed AKI in setting of recent MI/ contrast and now unstable angina.  Values from prior hospitalization would be helpful.  Holding ARB.  I see IVFs have been ordered- he looks a little volume overloaded anyway so I'm going to decrease rate to 50 mL/ hr.  I suspect he'll need diuresis after cardiac cath, Lasix has already been given today, hold dose for tomorrow pending cath.  Discussed risks of CIN with pt and wife present- will be closely following; hopefully will not need dialysis.  Will get [place] and UA/ UPC.  2.  Unstable angina: in setting of recent MI 10/2019, now with progressive symptoms.  To have cardiac cath tomorrow.  TTE ordered.  3.  HTN: ARB on hold for now  4.  DM II: per primary  5.  Dispo: pending  Madelon Lips 01/09/2020, 3:43 PM

## 2020-01-09 NOTE — Progress Notes (Signed)
Patients code status is listed as DNR, patient states he actually does want CPR with ACLS meds, but nothing else as far as life support. Please advise.

## 2020-01-09 NOTE — Plan of Care (Signed)

## 2020-01-09 NOTE — ED Triage Notes (Signed)
Pt reports central CP for a week with SOB. Pt took home nitro once with some relief. Recent MI before Thanksgiving.

## 2020-01-09 NOTE — ED Provider Notes (Signed)
[facility] EMERGENCY DEPARTMENT Provider Note   CSN: [id number] Arrival date & time: 01/09/20  3149     History Chief Complaint  Patient presents with   Chest Pain    Allen Medina is a 79 y.o. male with PMHx HTN, HLD, CKD stage IV, Diabetes, CAD s/p CABG x 4 wh presents to the ED today complaining of sudden onset, intermittent, substernal chest pain x 1 week. Pt also complains of constant shortness of breath for the past week, worse with exertion.  Patient reports that he has gained approximately 3 pounds in the past week and states that his use feel tighter on his feet however he states that his hands feel about the same.  He reports that his chest pain has become more constant in nature over the past several days and he woke up today with chest pain.  He states he took 1 nitroglycerin and currently does not have any active chest pain.  Patient does report that he had a recent MI in November and this feels about the same.  Patient does report that he recently took himself off of his Brilinta about 1 week ago prior to his symptoms beginning as it "tore up my stomach."  Patient states he called his cardiologist to let the nurse know but never heard back from his cardiologist specifically, he does report that he is supposed to see him tomorrow but change the appointment to early February as he is planning to get his Covid vaccine tomorrow.  She denies any recent prolonged travel or immobilization.  No history of DVT/PE.  No exogenous hormone use.  No active malignancy.  No hemoptysis.  Patient denies fever, chills, cough, abdominal pain, nausea, vomiting, diaphoresis, leg swelling, any other associated symptoms.  Is a never smoker.   The history is provided by the patient and medical records.       Past Medical History:  Diagnosis Date   Arthritis    "hands" (04/16/2014)   Blindness of left eye    CAD (coronary artery disease) of bypass graft 2000   Occluded grafts  --> redo CABG   CAD, multiple vessel 1991   last cath 2006-patent grafts, last nuc 09/10/09-no ischemia   Chronic lower back pain    CKD (chronic kidney disease) stage 4, GFR 15-29 ml/min (HCC)    kidney stones,cmet 02/09/12, 02/13/12 on chart   Claudication ([place])    LE dopplers 03/17/10, normal ABIs, normal pressures   Diabetes mellitus type 2 with complications ([place])    renal insuff, HTN, CAD, PVD   History of blood transfusion 2010   "after back OR" (04/16/2014)   HTN (hypertension)    difficult to control   Hx of myocardial infarction    Hyperlipidemia LDL goal < 100    Kidney stones    Myocardial infarction ([place]) 2000   LOV with EKG Dr Rex Kras 7/12 on chart, eccho 10/03, stress test 9/10 on chart, chest x ray 2/13 EPIC, chest CT 02/13/12 on chart   Neuropathy in diabetes [facility])    Peripheral vascular disease ([place])    peripheral neuropathy, claudication   Pneumonia    hx of   S/P CABG x 4 2000   SVG-OM1, SVG-OM 2-OM 3, SVG-diagonal, free RIMA-RPDA ( has stent in the vessel from 2003)-   S/P CABG x 5    only LIMA-LAD still patent   Spinal stenosis    Thin skin    Vitamin B 12 deficiency  Patient Active Problem List   Diagnosis Date Noted   Unstable angina ([place]) 01/09/2020   Esophageal dysmotility 01/09/2020   Difficulty in walking(719.7) 06/10/2014   Weakness of left leg 06/10/2014   Back pain 04/16/2014   Preoperative cardiovascular examination 03/15/2014   Pre-operative cardiovascular examination 03/15/2014   CAD, multiple vessel; native LAD and circumflex 100% occluded; distal RCA severely diseased with possible PL or RPDA occluded 06/30/2013   Obesity (BMI 30.0-34.9) 06/30/2013   Peripheral vascular disease (HCC)    HTN (hypertension)    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 06/26/2013   Diabetes ([place]) 03/17/2013   CAD (coronary artery disease) of artery bypass graft - only LIMA-LAD patent from CABG in 91; rredo CABG 2000     CKD (chronic kidney disease) stage 4, GFR 15-29 ml/min (HCC)    Hyperlipidemia LDL goal < 100    Spinal stenosis    Vitamin B 12 deficiency     Past Surgical History:  Procedure Laterality Date   ANTERIOR LAT LUMBAR FUSION N/A 04/16/2014   Procedure: Lateral  Lumbar Two-Three/Three-Four Interbody and Fusion with Lateral Pedicle Screw Fixation Lumbar Two-Four;  Surgeon: Melina Schools, MD;  Location: [facility];  Service: Orthopedics;  Laterality: N/A;   BACK SURGERY  2009, 2010   CARDIAC CATHETERIZATION  06/04/1990   severe 3 vessel dz   CARDIAC CATHETERIZATION  01/13/97   patent grafts, nl LV function   CARDIAC CATHETERIZATION  01/28/1999   severe native CAD with disease involving all 3 SVG, recommend redo CABG   CARDIAC CATHETERIZATION  05/16/2002   patent graft except of mod to sever stenosis in the midprotion of the RIMA to the posterior descending artery   CARDIAC CATHETERIZATION  08/28/2003   nl lv fxn, patent grafts to all vessels with small vessel disease   CARDIAC CATHETERIZATION  07/07/2005   patent grafts, EF 45-50%   CARPAL TUNNEL RELEASE Right    CATARACT EXTRACTION W/ INTRAOCULAR LENS  IMPLANT, BILATERAL     CHOLECYSTECTOMY     COLONOSCOPY N/A 03/31/2017   Procedure: COLONOSCOPY;  Surgeon: Carol Ada, MD;  Location: [facility];  Service: Endoscopy;  Laterality: N/A;   CORONARY ANGIOPLASTY WITH STENT PLACEMENT  05/22/2002   3.0x17mm cypher stent covered the proximal and distal portion of RIMA, final inflation -16x60   CORONARY ARTERY BYPASS GRAFT  1991 & 2000   only LIMA-LAD patent   CYSTOSCOPY W/ STONE MANIPULATION  2013   CYSTOSCOPY/RETROGRADE/URETEROSCOPY/STONE EXTRACTION WITH BASKET  02/16/2012   Procedure: CYSTOSCOPY/RETROGRADE/URETEROSCOPY/STONE EXTRACTION WITH BASKET;  Surgeon: Malka So, MD;  Location: [facility];  Service: Urology;  Laterality: Left;  Left Ureterscopy with Stone Extraction   DOPPLER ECHOCARDIOGRAPHY  07/09/2013   EF 65-78  (up from 50-55% in 2013), minimally increased transvalvular velocity across the aortic valve suggestive of very mild stenosis to simply sclerosis. Normal diastolic indices   DOPPLER ECHOCARDIOGRAPHY     LATERAL FUSION LUMBAR SPINE  04/16/2014   NM MYOVIEW LTD  02/2013   no evidence of ischemia or infarct, septal hypokinesis/dyskinesis    REDO - CORONARY ARTERY BYPASS GRAFT  2000    previous LIMA to LAD patent; seqSVG - OM 2, OM 3, SVG-D3, SVG-OM1, fRIMA-rPDA   TONSILLECTOMY         History reviewed. No pertinent family history.  Social History   Tobacco Use   Smoking status: Never Smoker   Smokeless tobacco: Never Used  Substance Use Topics   Alcohol use: No   Drug use: No  Home Medications Prior to Admission medications   Medication Sig Start Date End Date Taking? Authorizing Provider  albuterol (PROAIR HFA) 108 (90 Base) MCG/ACT inhaler Inhale 2 puffs into the lungs every 6 (six) hours as needed for wheezing or shortness of breath.   Yes [provider]  amLODipine (NORVASC) 10 MG tablet Take 10 mg by mouth daily.   Yes [provider]  aspirin EC 81 MG tablet Take 81 mg by mouth daily.   Yes [provider]  carvedilol (COREG) 6.25 MG tablet Take 6.25 mg by mouth 2 (two) times daily.   Yes [provider]  Cholecalciferol (VITAMIN D3) 5000 units CAPS Take 5,000 Units by mouth daily.   Yes [provider]  clopidogrel (PLAVIX) 75 MG tablet Take 75 mg by mouth daily.   Yes [provider]  cyanocobalamin (,VITAMIN B-12,) 1000 MCG/ML injection Inject 1,000 mcg into the muscle every 30 (thirty) days.    Yes [provider]  DM-Doxylamine-Acetaminophen (NYQUIL HBP COLD & FLU) 15-6.25-325 MG/15ML LIQD Take 15 mLs by mouth every 6 (six) hours as needed (cold symptoms).   Yes [provider]  Dulaglutide (TRULICITY) 1.5 CH/8.8FO SOPN Inject 1.5 mg into the skin every Saturday.   Yes [provider]   Febuxostat (ULORIC) 80 MG TABS Take 80 mg by mouth daily.   Yes [provider]  furosemide (LASIX) 40 MG tablet Take 40 mg by mouth daily.   Yes [provider]  HYDROcodone-acetaminophen (NORCO/VICODIN) 5-325 MG tablet Take 1 tablet by mouth every 6 (six) hours as needed. 12/01/18  Yes Fredia Sorrow, MD  Insulin Glargine (TOUJEO SOLOSTAR) 300 UNIT/ML SOPN Inject 24-26 Units into the skin daily.    Yes [provider]  insulin lispro (HUMALOG KWIKPEN) 100 UNIT/ML KiwkPen Inject 12 Units into the skin 2 (two) times daily.    Yes [provider]  Melatonin 5 MG SUBL Place 5 mg under the tongue at bedtime.    Yes [provider]  nitroGLYCERIN (NITROSTAT) 0.4 MG SL tablet DISSOLVE 1 TABLET UNDER TONGUE EVERY 5 MINUTES UP TO 15 MIN FOR CHEST PAIN. IF NO RELIEF CALL 911. Patient taking differently: Place 0.4 mg under the tongue every 5 (five) minutes as needed for chest pain.  12/23/19  Yes Adrian Prows, MD  olmesartan (BENICAR) 40 MG tablet Take 20 mg by mouth daily.   Yes [provider]  omeprazole (PRILOSEC) 40 MG capsule Take 40 mg by mouth daily.   Yes [provider]  pregabalin (LYRICA) 75 MG capsule Take 75 mg by mouth 3 (three) times daily.   Yes [provider]  rosuvastatin (CRESTOR) 20 MG tablet Take 1 tablet (20 mg total) by mouth daily. 11/29/19  Yes Adrian Prows, MD  tadalafil (CIALIS) 5 MG tablet Take 5 mg by mouth at bedtime.    Yes [provider]  zolpidem (AMBIEN) 10 MG tablet Take 10 mg by mouth at bedtime.   Yes [provider]  ticagrelor (BRILINTA) 90 MG TABS tablet Take 1 tablet (90 mg total) by mouth 2 (two) times daily. Patient not taking: Reported on 01/09/2020 11/29/19   Adrian Prows, MD    Allergies    Patient has no known allergies.  Review of Systems   Review of Systems  Constitutional: Negative for chills and fever.  HENT: Negative for congestion.   Eyes: Negative for visual  disturbance.  Respiratory: Positive for shortness of breath. Negative for cough.   Cardiovascular: Positive for  chest pain. Negative for palpitations and leg swelling.  Gastrointestinal: Positive for abdominal distention. Negative for abdominal pain, diarrhea, nausea and vomiting.  Genitourinary: Negative for difficulty urinating.  Musculoskeletal: Negative for arthralgias and myalgias.  Skin: Negative for rash.  Neurological: Negative for weakness, numbness and headaches.    Physical Exam Updated Vital Signs BP (!) 138/52    Pulse (!) 36    Temp (!) 97.4 F (36.3 C) (Oral)    Resp 18    SpO2 98%   Physical Exam Vitals and nursing note reviewed.  Constitutional:      Appearance: He is not ill-appearing or diaphoretic.  HENT:     Head: Normocephalic and atraumatic.  Eyes:     Conjunctiva/sclera: Conjunctivae normal.  Cardiovascular:     Rate and Rhythm: Normal rate and regular rhythm.     Pulses:          Radial pulses are 2+ on the right side.       Dorsalis pedis pulses are 2+ on the right side and 2+ on the left side.     Heart sounds: Normal heart sounds.  Pulmonary:     Effort: Pulmonary effort is normal.     Breath sounds: Normal breath sounds. No decreased breath sounds, wheezing, rhonchi or rales.  Chest:     Chest wall: No tenderness.  Abdominal:     Palpations: Abdomen is soft.     Tenderness: There is no abdominal tenderness. There is no guarding or rebound.  Musculoskeletal:     Cervical back: Neck supple.     Right lower leg: Edema present.     Left lower leg: Edema present.     Comments: Nonpitting edema bilaterally  Skin:    General: Skin is warm and dry.  Neurological:     Mental Status: He is alert.     ED Results / Procedures / Treatments   Labs (all labs ordered are listed, but only abnormal results are displayed) Labs Reviewed  BASIC METABOLIC PANEL - Abnormal; Notable for the following components:      Result Value   BUN 68 (*)     Creatinine, Ser 3.58 (*)    GFR calc non Af Amer 15 (*)    GFR calc Af Amer 18 (*)    All other components within normal limits  CBC - Abnormal; Notable for the following components:   RBC 3.70 (*)    Hemoglobin 11.8 (*)    HCT 36.5 (*)    All other components within normal limits  BRAIN NATRIURETIC PEPTIDE - Abnormal; Notable for the following components:   B Natriuretic Peptide 201.8 (*)    All other components within normal limits  D-DIMER, QUANTITATIVE (NOT AT [facility]) - Abnormal; Notable for the following components:   D-Dimer, Quant 1.41 (*)    All other components within normal limits  TROPONIN I (HIGH SENSITIVITY) - Abnormal; Notable for the following components:   Troponin I (High Sensitivity) 19 (*)    All other components within normal limits  TROPONIN I (HIGH SENSITIVITY) - Abnormal; Notable for the following components:   Troponin I (High Sensitivity) 20 (*)    All other components within normal limits  SARS CORONAVIRUS 2 (TAT 6-24 HRS)  HEMOGLOBIN A1C    EKG EKG Interpretation  Date/Time:  Thursday January 09 2020 08:23:58 EST Ventricular Rate:  74 PR Interval:    QRS Duration: 88 QT Interval:  392 QTC Calculation: 435 R Axis:   48 Text Interpretation: in  a pattern of bigeminy Cannot rule out Anterior infarct , age undetermined Abnormal ECG PVCs Confirmed by Elnora Morrison [phone]) on 01/09/2020 8:35:48 AM   Radiology DG Chest Port 1 View  Result Date: 01/09/2020 CLINICAL DATA:  Chest pain EXAM: PORTABLE CHEST 1 VIEW COMPARISON:  04/08/2014 FINDINGS: Post CABG changes with multiple fractured median sternotomy wires, as seen on previous study. Heart size is within normal limits. No focal airspace consolidation, pleural effusion, or pneumothorax. IMPRESSION: No acute cardiopulmonary findings. Electronically Signed   By: Davina Poke D.O.   On: 01/09/2020 08:59    Procedures Procedures (including critical care time)  Medications Ordered in ED Medications    ticagrelor (BRILINTA) tablet 90 mg (90 mg Oral Given 01/09/20 1530)  aspirin EC tablet 81 mg (81 mg Oral Given 01/09/20 1529)  febuxostat (ULORIC) tablet 80 mg (has no administration in time range)  HYDROcodone-acetaminophen (NORCO/VICODIN) 5-325 MG per tablet 1 tablet (has no administration in time range)  amLODipine (NORVASC) tablet 10 mg (10 mg Oral Given 01/09/20 1527)  carvedilol (COREG) tablet 6.25 mg (6.25 mg Oral Given 01/09/20 1530)  furosemide (LASIX) tablet 40 mg (40 mg Oral Given 01/09/20 1529)  nitroGLYCERIN (NITROSTAT) SL tablet 0.4 mg (has no administration in time range)  irbesartan (AVAPRO) tablet 300 mg (has no administration in time range)  rosuvastatin (CRESTOR) tablet 20 mg (20 mg Oral Given 01/09/20 1529)  zolpidem (AMBIEN) tablet 5 mg (has no administration in time range)  insulin glargine (LANTUS) injection 24 Units (has no administration in time range)  pantoprazole (PROTONIX) EC tablet 80 mg (80 mg Oral Given 01/09/20 1528)  Melatonin TABS 6 mg (has no administration in time range)  pregabalin (LYRICA) capsule 75 mg (has no administration in time range)  cholecalciferol (VITAMIN D3) tablet 5,000 Units (5,000 Units Oral Given 01/09/20 1528)  albuterol (PROVENTIL) (2.5 MG/3ML) 0.083% nebulizer solution 3 mL (has no administration in time range)  acetaminophen (TYLENOL) tablet 650 mg (has no administration in time range)  ondansetron (ZOFRAN) injection 4 mg (has no administration in time range)  enoxaparin (LOVENOX) injection 30 mg (has no administration in time range)  sodium chloride flush (NS) 0.9 % injection 3 mL (3 mLs Intravenous Given 01/09/20 1531)  sodium chloride flush (NS) 0.9 % injection 3 mL (has no administration in time range)  0.9 %  sodium chloride infusion (has no administration in time range)  insulin aspart (novoLOG) injection 0-15 Units (has no administration in time range)  insulin aspart (novoLOG) injection 0-5 Units (has no administration in time range)   0.9 %  sodium chloride infusion (has no administration in time range)  0.9 %  sodium chloride infusion (has no administration in time range)  insulin aspart (novoLOG) injection 12 Units (has no administration in time range)  sodium chloride 0.9 % bolus 500 mL (0 mLs Intravenous Stopped 01/09/20 1243)    ED Course  I have reviewed the triage vital signs and the nursing notes.  Pertinent labs & imaging results that were available during my care of the patient were reviewed by me and considered in my medical decision making (see chart for details).  79 year old male presents the ED complaining of intermittent substernal chest pain for the past week with new onset shortness of breath.  Does report he recently took himself off of his Brilinta 1 week ago and changed to Plavix as the Brilinta was causing him to have upset stomach.  Patient with history of recent MI in [place] at [facility] with CABG.  Currently being seen by Dr. Einar Gip cardiology.  She has no active chest pain at this time as he ate was having chest pain this morning and took nitroglycerin with relief.  He states he feels short of breath even at rest.  He is denying any COVID-19 positive exposure.  He does report that his weight has increased 3 pounds and he feels like his shoes are tighter, there is concern congestive heart failure exacerbation at this time.  Will work-up for ACS, congestive heart failure exacerbation, other etiology.  Doubt dissection today.  Patient has equal pulses and blood pressure is normotensive.  Dimer added at this time with shortness of breath and unable to [facility] out.   X-ray negative.  CBC without leukocytosis today.  Hemoglobin is stable compared to baseline.  BMP with creatinine of 3.58 and GFR of 15, patient does report CKD stage IV however most recent creatinine that we have in the system is from December 2019 at 2.22.  Will give 500 cc fluid bolus with concern for worsening acute on chronic renal failure  today.  Initial troponin of 19, will repeat.  BNP mildly elevated at 201.8.  There are no signs of fluid overload on chest x-ray today.  Will hold off on diuresis at this time.  Dimer elevated at 1.41, considering creatinine elevated cannot obtain CTA.  Patient does have heart score of 6 and has significant risk factors for ACS.  I think he would benefit from admission for cardiac work-up at this time with plan for possible VQ scan while in the hospital.  Will consult medicine.  Discussed case with nephrology who will evaluate patient.   Clinical Course as of Jan 09 1604  Thu Jan 09, 2020  0942 B Natriuretic Peptide(!): 201.8 [MV]  [phone] Troponin I (High Sensitivity)(!): 19 [MV]  1020 D-Dimer, Quant(!): 1.41 [MV]  1238 Discussed case with Dr. Evangeline Gula who agrees to evaluate patient for admission. Recommends cardiology and nephrology consults.    [MV]  1308 Discussed case with Dr. Einar Gip cardiologist who will evaluate patient with plan for cath in the morning   [MV]  1332 Discussed case with Dr. Benson Norway gastroenterology who will evaluate patient tomorrow   [MV]    Clinical Course User Index [MV] Eustaquio Maize, PA-C   MDM Rules/Calculators/A&P                       Final Clinical Impression(s) / ED Diagnoses Final diagnoses:  Acute renal failure, unspecified acute renal failure type [facility])  Acute chest pain  Acute dyspnea    Rx / DC Orders ED Discharge Orders    None       Eustaquio Maize, PA-C 01/09/20 1606    Elnora Morrison, MD 01/11/20 [phone]

## 2020-01-09 NOTE — H&P (Signed)
History and Physical    Allen Medina YWV:[id number] DOB: December 07, 1941 DOA: 01/09/2020  PCP: Marton Redwood, MD  Patient coming from: Home  I have personally briefly reviewed patient's old medical records in [place]  Chief Complaint: Chest pains times 1 week  HPI: Allen Medina is a 79 y.o. male with medical history significant of hypertension, hyperlipidemia, chronic kidney disease stage IV, diabetes type 2, coronary artery disease status post CABG x4 with a myocardial infarction in November 2020 (treated in [facility]) who presents the emergency department complaining of sudden onset of intermittent substernal chest pain lasting about a week.  He has had constant shortness of breath associated with it for the past week worsened with exertion.  Had 3 pound weight gain in the past weeks feels a bit tighter in his feet but his hands feel about the same note tense skin.  Chest pain has become more more constant in nature over the past several days.  He woke up today with chest pain.  A recent myocardial infarction in November he received a stent and recently took himself off his Brilinta about a week ago because he felt that his stomach was hurting because of it.  He had a cardiologist to notify them of taking himself off the Brilinta and had not heard back from them.  Does have an appointment with his cardiologist tomorrow as well as an appointment for his Covid vaccine.  Patient describes his substernal chest pain is feeling like he is choking substernal.  Review of his records shows that he had an esophagram in 2018 which showed tertiary contractions.  ED Course: Chest x-ray unremarkable, CBC without leukocytosis, hemoglobin stable, atony markedly elevated last one we have in our system was a 2.2.  Today he is 3.58.  BNP mildly elevated at 201.8.  No evidence of fluid overload.  Dimer mildly elevated at 1.41.  Cannot obtain CTA due to patient in creatinine.  Heart score is 6 with  significant risk factors.  Case discussed with cardiology who plans cardiac catheterization in a.m.  Review of Systems: As per HPI otherwise all other systems reviewed and  negative.   Past Medical History:  Diagnosis Date  . Arthritis    "hands" (04/16/2014)  . Blindness of left eye   . CAD (coronary artery disease) of bypass graft 2000   Occluded grafts --> redo CABG  . CAD, multiple vessel 1991   last cath 2006-patent grafts, last nuc 09/10/09-no ischemia  . Chronic lower back pain   . CKD (chronic kidney disease) stage 4, GFR 15-29 ml/min (HCC)    kidney stones,cmet 02/09/12, 02/13/12 on chart  . Claudication ([place])    LE dopplers 03/17/10, normal ABIs, normal pressures  . Diabetes mellitus type 2 with complications (HCC)    renal insuff, HTN, CAD, PVD  . History of blood transfusion 2010   "after back OR" (04/16/2014)  . HTN (hypertension)    difficult to control  . Hx of myocardial infarction   . Hyperlipidemia LDL goal < 100   . Kidney stones   . Myocardial infarction ([place]) 2000   LOV with EKG Dr Rex Kras 7/12 on chart, eccho 10/03, stress test 9/10 on chart, chest x ray 2/13 EPIC, chest CT 02/13/12 on chart  . Neuropathy in diabetes ([place])   . Peripheral vascular disease ([place])    peripheral neuropathy, claudication  . Pneumonia    hx of  . S/P CABG x 4 2000   SVG-OM1, SVG-OM 2-OM  3, SVG-diagonal, free RIMA-RPDA ( has stent in the vessel from 2003)-  . S/P CABG x 5    only LIMA-LAD still patent  . Spinal stenosis   . Thin skin   . Vitamin B 12 deficiency     Past Surgical History:  Procedure Laterality Date  . ANTERIOR LAT LUMBAR FUSION N/A 04/16/2014   Procedure: Lateral  Lumbar Two-Three/Three-Four Interbody and Fusion with Lateral Pedicle Screw Fixation Lumbar Two-Four;  Surgeon: Melina Schools, MD;  Location: [facility];  Service: Orthopedics;  Laterality: N/A;  . BACK SURGERY  2009, 2010  . CARDIAC CATHETERIZATION  06/04/1990   severe 3 vessel dz  . CARDIAC  CATHETERIZATION  01/13/97   patent grafts, nl LV function  . CARDIAC CATHETERIZATION  01/28/1999   severe native CAD with disease involving all 3 SVG, recommend redo CABG  . CARDIAC CATHETERIZATION  05/16/2002   patent graft except of mod to sever stenosis in the midprotion of the RIMA to the posterior descending artery  . CARDIAC CATHETERIZATION  08/28/2003   nl lv fxn, patent grafts to all vessels with small vessel disease  . CARDIAC CATHETERIZATION  07/07/2005   patent grafts, EF 45-50%  . CARPAL TUNNEL RELEASE Right   . CATARACT EXTRACTION W/ INTRAOCULAR LENS  IMPLANT, BILATERAL    . CHOLECYSTECTOMY    . COLONOSCOPY N/A 03/31/2017   Procedure: COLONOSCOPY;  Surgeon: Carol Ada, MD;  Location: [facility];  Service: Endoscopy;  Laterality: N/A;  . CORONARY ANGIOPLASTY WITH STENT PLACEMENT  05/22/2002   3.0x46mm cypher stent covered the proximal and distal portion of RIMA, final inflation -16x60  . [place] & 2000   only LIMA-LAD patent  . CYSTOSCOPY W/ STONE MANIPULATION  2013  . CYSTOSCOPY/RETROGRADE/URETEROSCOPY/STONE EXTRACTION WITH BASKET  02/16/2012   Procedure: CYSTOSCOPY/RETROGRADE/URETEROSCOPY/STONE EXTRACTION WITH BASKET;  Surgeon: Malka So, MD;  Location: [facility];  Service: Urology;  Laterality: Left;  Left Ureterscopy with Stone Extraction  . DOPPLER ECHOCARDIOGRAPHY  07/09/2013   EF 65-78 (up from 50-55% in 2013), minimally increased transvalvular velocity across the aortic valve suggestive of very mild stenosis to simply sclerosis. Normal diastolic indices  . DOPPLER ECHOCARDIOGRAPHY    . LATERAL FUSION LUMBAR SPINE  04/16/2014  . NM MYOVIEW LTD  02/2013   no evidence of ischemia or infarct, septal hypokinesis/dyskinesis   . REDO - CORONARY ARTERY BYPASS GRAFT  2000    previous LIMA to LAD patent; seqSVG - OM 2, OM 3, SVG-D3, SVG-OM1, fRIMA-rPDA  . TONSILLECTOMY      Social History   Social History Narrative   He is a married father of  three, grandfather of 80, great-grandfather of four. He usually walks   daily about 20 minutes a day - but not recently due to back pain. Does not currently smoke and does not drink.      reports that he has never smoked. He has never used smokeless tobacco. He reports that he does not drink alcohol or use drugs.  No Known Allergies  History reviewed. No pertinent family history.   Prior to Admission medications   Medication Sig Start Date End Date Taking? Authorizing Provider  albuterol (PROAIR HFA) 108 (90 Base) MCG/ACT inhaler Inhale 2 puffs into the lungs every 6 (six) hours as needed for wheezing or shortness of breath.   Yes [provider]  amLODipine (NORVASC) 10 MG tablet Take 10 mg by mouth daily.   Yes [provider]  aspirin EC 81 MG  tablet Take 81 mg by mouth daily.   Yes [provider]  carvedilol (COREG) 6.25 MG tablet Take 6.25 mg by mouth 2 (two) times daily.   Yes [provider]  Cholecalciferol (VITAMIN D3) 5000 units CAPS Take 5,000 Units by mouth daily.   Yes [provider]  clopidogrel (PLAVIX) 75 MG tablet Take 75 mg by mouth daily.   Yes [provider]  cyanocobalamin (,VITAMIN B-12,) 1000 MCG/ML injection Inject 1,000 mcg into the muscle every 30 (thirty) days.    Yes [provider]  DM-Doxylamine-Acetaminophen (NYQUIL HBP COLD & FLU) 15-6.25-325 MG/15ML LIQD Take 15 mLs by mouth every 6 (six) hours as needed (cold symptoms).   Yes [provider]  Dulaglutide (TRULICITY) 1.5 JZ/7.9XT SOPN Inject 1.5 mg into the skin every Saturday.   Yes [provider]  Febuxostat (ULORIC) 80 MG TABS Take 80 mg by mouth daily.   Yes [provider]  furosemide (LASIX) 40 MG tablet Take 40 mg by mouth daily.   Yes [provider]  HYDROcodone-acetaminophen (NORCO/VICODIN) 5-325 MG tablet Take 1 tablet by mouth every 6 (six) hours as needed. 12/01/18  Yes Fredia Sorrow, MD    Insulin Glargine (TOUJEO SOLOSTAR) 300 UNIT/ML SOPN Inject 24-26 Units into the skin daily.    Yes [provider]  insulin lispro (HUMALOG KWIKPEN) 100 UNIT/ML KiwkPen Inject 12 Units into the skin 2 (two) times daily.    Yes [provider]  Melatonin 5 MG SUBL Place 5 mg under the tongue at bedtime.    Yes [provider]  nitroGLYCERIN (NITROSTAT) 0.4 MG SL tablet DISSOLVE 1 TABLET UNDER TONGUE EVERY 5 MINUTES UP TO 15 MIN FOR CHEST PAIN. IF NO RELIEF CALL 911. Patient taking differently: Place 0.4 mg under the tongue every 5 (five) minutes as needed for chest pain.  12/23/19  Yes Adrian Prows, MD  olmesartan (BENICAR) 40 MG tablet Take 20 mg by mouth daily.   Yes [provider]  omeprazole (PRILOSEC) 40 MG capsule Take 40 mg by mouth daily.   Yes [provider]  pregabalin (LYRICA) 75 MG capsule Take 75 mg by mouth 3 (three) times daily.   Yes [provider]  rosuvastatin (CRESTOR) 20 MG tablet Take 1 tablet (20 mg total) by mouth daily. 11/29/19  Yes Adrian Prows, MD  tadalafil (CIALIS) 5 MG tablet Take 5 mg by mouth at bedtime.    Yes [provider]  zolpidem (AMBIEN) 10 MG tablet Take 10 mg by mouth at bedtime.   Yes [provider]  ticagrelor (BRILINTA) 90 MG TABS tablet Take 1 tablet (90 mg total) by mouth 2 (two) times daily. Patient not taking: Reported on 01/09/2020 11/29/19   Adrian Prows, MD    Physical Exam:  Constitutional: NAD, calm, comfortable Vitals:   01/09/20 1450 01/09/20 1452 01/09/20 1523 01/09/20 1823  BP: 130/86  136/62   Pulse:  61 62   Resp:   20   Temp:   98 F (36.7 C)   TempSrc:   Oral   SpO2:  95% 98%   Weight:    93.5 kg  Height:    5\' 6"  (1.676 m)   Eyes: PERRL, lids and conjunctivae normal ENMT: Mucous membranes are moist. Posterior pharynx clear of any exudate or lesions.Normal dentition.  Neck: normal, supple, no masses, no thyromegaly Respiratory: clear to auscultation  bilaterally, no wheezing, no crackles. Normal respiratory effort. No accessory muscle use.  Cardiovascular: Regular rate and  rhythm, no murmurs / rubs / gallops. No extremity edema. 2+ pedal pulses. No carotid bruits.  Abdomen: no tenderness, no masses palpated. No hepatosplenomegaly. Bowel sounds positive.  Musculoskeletal: no clubbing / cyanosis. No joint deformity upper and lower extremities. Good ROM, no contractures. Normal muscle tone.  Skin: no rashes, lesions, ulcers. No induration Neurologic: CN 2-12 grossly intact. Sensation intact, DTR normal. Strength 5/5 in all 4.  Psychiatric: Normal judgment and insight. Alert and oriented x 3. Normal mood.    Labs on Admission: I have personally reviewed following labs and imaging studies  CBC: Recent Labs  Lab 01/09/20 0831  WBC 9.3  HGB 11.8*  HCT 36.5*  MCV 98.6  PLT 119   Basic Metabolic Panel: Recent Labs  Lab 01/09/20 0831  NA 142  K 4.8  CL 108  CO2 22  GLUCOSE 88  BUN 68*  CREATININE 3.58*  CALCIUM 9.0   GFR: Estimated Creatinine Clearance: 18.5 mL/min (A) (by C-G formula based on SCr of 3.58 mg/dL (H)).  HbA1C: Recent Labs    01/09/20 1700  HGBA1C 6.1*   CBG: Recent Labs  Lab 01/09/20 1637  GLUCAP 115*   Urine analysis:    Component Value Date/Time   COLORURINE YELLOW 04/17/2014 1941   APPEARANCEUR CLEAR 04/17/2014 1941   LABSPEC 1.016 04/17/2014 1941   PHURINE 5.5 04/17/2014 1941   GLUCOSEU NEGATIVE 04/17/2014 1941   HGBUR NEGATIVE 04/17/2014 1941   BILIRUBINUR NEGATIVE 04/17/2014 1941   KETONESUR NEGATIVE 04/17/2014 1941   PROTEINUR NEGATIVE 04/17/2014 1941   UROBILINOGEN 0.2 04/17/2014 1941   NITRITE NEGATIVE 04/17/2014 1941   LEUKOCYTESUR NEGATIVE 04/17/2014 1941    Radiological Exams on Admission: DG Chest Port 1 View  Result Date: 01/09/2020 CLINICAL DATA:  Chest pain EXAM: PORTABLE CHEST 1 VIEW COMPARISON:  04/08/2014 FINDINGS: Post CABG changes with multiple fractured median  sternotomy wires, as seen on previous study. Heart size is within normal limits. No focal airspace consolidation, pleural effusion, or pneumothorax. IMPRESSION: No acute cardiopulmonary findings. Electronically Signed   By: Davina Poke D.O.   On: 01/09/2020 08:59    EKG: Independently reviewed.  EKG shows bigeminy is difficult to compare with priors.  Assessment/Plan Principal Problem:   Unstable angina (HCC) Active Problems:   Esophageal dysmotility   CKD (chronic kidney disease) stage 4, GFR 15-29 ml/min (HCC)   CAD, multiple vessel; native LAD and circumflex 100% occluded; distal RCA severely diseased with possible PL or RPDA occluded   Diabetes (HCC)   Peripheral vascular disease (HCC)   HTN (hypertension)   Obesity (BMI 30.0-34.9)   1.  Unstable angina: As likely patient is suffering from unstable angina given that he stopped his Brilinta.  Other confounding factors are that his D-dimer slightly elevated and he may have a pulmonary embolism.  He cannot undergo a CTA of the chest due to his renal function.  I have ordered a VQ scan to further evaluate that.  Possibility also exists that he is suffering from esophageal spasms these were noted on an esophagram a couple of years ago.  Cardiology has been consulted.  Of course the greatest concern is that he stopped his Brilinta after having a recent stent placed.  Plan for cardiac cath in a.m.  2.  Esophageal dysmotility.  GI Dr. Benson Norway has been consulted.  Will evaluate patient in a.m. pending cardiac catheterization.  3.  Acute on chronic kidney disease stage IV: Last labs are not available.  We have requested through care everywhere and  are awaiting results.  Neurology has been consulted.  Will gently hydrate patient.  4.  Coronary artery disease with multiple vessels involved: She has had a CABG in 2000 and has several stents placed since then.  Most recently in November 2020 patient had a stent placed and was on Brilinta.  He  unfortunately stopped this.  Scheduled for cardiac cath in a.m.  5.  Diabetes type 2: Continue home medication management and insulin sliding scale.  Hold Metformin.  6.  Peripheral vascular disease: Noted continue home medication management.  7.  Hypertension: Noted continue home medication management.  8.  Obesity: Noted weight loss recommended.  DVT prophylaxis: Lovenox Code Status: DNR Family Communication: Allen Medina pt wife by phone Disposition Plan: Likely home in 48 hours Consults called: Cardiology Dr. Einar Gip, GI Dr. Almyra Free, nephrology Admission status: observation  It is my clinical opinion that referral for OBSERVATION is reasonable and necessary in this patient based on the above information provided. The aforementioned taken together are felt to place the patient at high risk for further clinical deterioration. However it is anticipated that the patient may be medically stable for discharge from the hospital within 24 to 48 hours.   Lady Deutscher MD FACP [facility] Pager [phone]  How to contact the [facility] Attending or Consulting provider [place] or covering provider during after hours [place], for this patient?  1. Check the care team in [facility] and look for a) attending/consulting [facility] provider listed and b) the [facility] team listed 2. Log into [email or web address] and use [facility] universal password to access. If you do not have the password, please contact the hospital operator. 3. Locate the [facility] provider you are looking for under [facility] and page to a number that you can be directly reached. 4. If you still have difficulty reaching the provider, please page the [facility] (Director on Call) for the Hospitalists listed on amion for assistance.  If 7PM-7AM, please contact night-coverage [email or web address] Password [facility]  01/09/2020, 7:32 PM

## 2020-01-09 NOTE — Consult Note (Signed)
CARDIOLOGY CONSULT NOTE  Patient ID: Allen Medina MRN: [id number] DOB/AGE: 06/16/1941 79 y.o.  Admit date: 01/09/2020 Referring Physician  Randa Spike, MD Primary Physician:  Marton Redwood, MD Reason for Consultation  Unstable angina  Patient ID: Allen Medina, male    DOB: [birth year], 79 y.o.   MRN: [id number]  Chief Complaint  Patient presents with  . Chest Pain   HPI:    Allen Medina  is a 79 y.o. Caucasian male   with type 2 diabetes, stage IV CKD being followed by Dr. Elmarie Shiley, diabetic peripheral neuropathy, diabetic retinopathy, hypertension, hyperlipidemia, Chronic back pain and digital joint disease and peripheral neuropathy, CAD and S/P CABG 1991 followed by redo CABG in 2003 due to occluded bypass grafts except free RIMA to LAD,  MI in 2006 needing stent to free [place] graft to LAD, Again admitted with acute inferior and lateral myocardial infarction on 11/13/2019, was emergently taken to the cardiac catheterization lab and underwent successful stenting to SVG to OM 2 and also to native distal OM 2 with implantation of 2 overlapping DES.   He presented to the emergency room with chest pain and shortness of breath, felt different from his recent MI, but started about 5-6 days ago and due to persistence and mainly due to dyspnea than chest pain presented to the ED. I  had last seen him on 11/29/2019 and I had reviewed his coronary angiograms and he has residual SVG to RCA disease.  He also had low flow phenomena at the stented segment recently after he presented with MI.  Has also been having abdominal discomfort for the past 3 days. No nausea or vomiting.    Past Medical History:  Diagnosis Date  . Arthritis    "hands" (04/16/2014)  . Blindness of left eye   . CAD (coronary artery disease) of bypass graft 2000   Occluded grafts --> redo CABG  . CAD, multiple vessel 1991   last cath 2006-patent grafts, last nuc 09/10/09-no ischemia  . Chronic lower back pain   . CKD  (chronic kidney disease) stage 4, GFR 15-29 ml/min (HCC)    kidney stones,cmet 02/09/12, 02/13/12 on chart  . Claudication ([place])    LE dopplers 03/17/10, normal ABIs, normal pressures  . Diabetes mellitus type 2 with complications (HCC)    renal insuff, HTN, CAD, PVD  . History of blood transfusion 2010   "after back OR" (04/16/2014)  . HTN (hypertension)    difficult to control  . Hx of myocardial infarction   . Hyperlipidemia LDL goal < 100   . Kidney stones   . Myocardial infarction ([place]) 2000   LOV with EKG Dr Rex Kras 7/12 on chart, eccho 10/03, stress test 9/10 on chart, chest x ray 2/13 EPIC, chest CT 02/13/12 on chart  . Neuropathy in diabetes ([place])   . Peripheral vascular disease ([place])    peripheral neuropathy, claudication  . Pneumonia    hx of  . S/P CABG x 4 2000   SVG-OM1, SVG-OM 2-OM 3, SVG-diagonal, free RIMA-RPDA ( has stent in the vessel from 2003)-  . S/P CABG x 5    only LIMA-LAD still patent  . Spinal stenosis   . Thin skin   . Vitamin B 12 deficiency    Past Surgical History:  Procedure Laterality Date  . ANTERIOR LAT LUMBAR FUSION N/A 04/16/2014   Procedure: Lateral  Lumbar Two-Three/Three-Four Interbody and Fusion with Lateral Pedicle Screw Fixation Lumbar Two-Four;  Surgeon: Melina Schools, MD;  Location: [facility];  Service: Orthopedics;  Laterality: N/A;  . BACK SURGERY  2009, 2010  . CARDIAC CATHETERIZATION  06/04/1990   severe 3 vessel dz  . CARDIAC CATHETERIZATION  01/13/97   patent grafts, nl LV function  . CARDIAC CATHETERIZATION  01/28/1999   severe native CAD with disease involving all 3 SVG, recommend redo CABG  . CARDIAC CATHETERIZATION  05/16/2002   patent graft except of mod to sever stenosis in the midprotion of the RIMA to the posterior descending artery  . CARDIAC CATHETERIZATION  08/28/2003   nl lv fxn, patent grafts to all vessels with small vessel disease  . CARDIAC CATHETERIZATION  07/07/2005   patent grafts, EF 45-50%  . CARPAL TUNNEL  RELEASE Right   . CATARACT EXTRACTION W/ INTRAOCULAR LENS  IMPLANT, BILATERAL    . CHOLECYSTECTOMY    . COLONOSCOPY N/A 03/31/2017   Procedure: COLONOSCOPY;  Surgeon: Carol Ada, MD;  Location: [facility];  Service: Endoscopy;  Laterality: N/A;  . CORONARY ANGIOPLASTY WITH STENT PLACEMENT  05/22/2002   3.0x46mm cypher stent covered the proximal and distal portion of RIMA, final inflation -16x60  . [place] & 2000   only LIMA-LAD patent  . CYSTOSCOPY W/ STONE MANIPULATION  2013  . CYSTOSCOPY/RETROGRADE/URETEROSCOPY/STONE EXTRACTION WITH BASKET  02/16/2012   Procedure: CYSTOSCOPY/RETROGRADE/URETEROSCOPY/STONE EXTRACTION WITH BASKET;  Surgeon: Malka So, MD;  Location: [facility];  Service: Urology;  Laterality: Left;  Left Ureterscopy with Stone Extraction  . DOPPLER ECHOCARDIOGRAPHY  07/09/2013   EF 65-78 (up from 50-55% in 2013), minimally increased transvalvular velocity across the aortic valve suggestive of very mild stenosis to simply sclerosis. Normal diastolic indices  . DOPPLER ECHOCARDIOGRAPHY    . LATERAL FUSION LUMBAR SPINE  04/16/2014  . NM MYOVIEW LTD  02/2013   no evidence of ischemia or infarct, septal hypokinesis/dyskinesis   . REDO - CORONARY ARTERY BYPASS GRAFT  2000    previous LIMA to LAD patent; seqSVG - OM 2, OM 3, SVG-D3, SVG-OM1, fRIMA-rPDA  . TONSILLECTOMY     Social History   Socioeconomic History  . Marital status: Married    Spouse name: Not on file  . Number of children: 3  . Years of education: Not on file  . Highest education level: Not on file  Occupational History  . Not on file  Tobacco Use  . Smoking status: Never Smoker  . Smokeless tobacco: Never Used  Substance and Sexual Activity  . Alcohol use: No  . Drug use: No  . Sexual activity: Not Currently  Other Topics Concern  . Not on file  Social History Narrative   He is a married father of three, grandfather of nine, great-grandfather of four. He usually walks    daily about 20 minutes a day - but not recently due to back pain. Does not currently smoke and does not drink.    Social Determinants of Health   Financial Resource Strain:   . Difficulty of Paying Living Expenses: Not on file  Food Insecurity:   . Worried About Charity fundraiser in the Last Year: Not on file  . Ran Out of Food in the Last Year: Not on file  Transportation Needs:   . Lack of Transportation (Medical): Not on file  . Lack of Transportation (Non-Medical): Not on file  Physical Activity:   . Days of Exercise per Week: Not on file  . Minutes of Exercise per Session: Not on file  Stress:   .  Feeling of Stress : Not on file  Social Connections:   . Frequency of Communication with Friends and Family: Not on file  . Frequency of Social Gatherings with Friends and Family: Not on file  . Attends Religious Services: Not on file  . Active Member of Clubs or Organizations: Not on file  . Attends Archivist Meetings: Not on file  . Marital Status: Not on file  Intimate Partner Violence:   . Fear of Current or Ex-Partner: Not on file  . Emotionally Abused: Not on file  . Physically Abused: Not on file  . Sexually Abused: Not on file   ROS  Review of Systems  Constitution: Positive for malaise/fatigue. Negative for weight gain.  Cardiovascular: Positive for chest pain and dyspnea on exertion. Negative for leg swelling and syncope.  Respiratory: Negative for hemoptysis.   Endocrine: Negative for cold intolerance.  Hematologic/Lymphatic: Does not bruise/bleed easily.  Gastrointestinal: Positive for diarrhea (chronic loose stools). Negative for hematochezia and melena.  Neurological: Negative for headaches and light-headedness.   Objective   Vitals with BMI 01/09/2020 01/09/2020 01/09/2020  Height - 5\' 6"  -  Weight - 206 lbs 2 oz -  BMI - 41.32 -  Systolic 440 - 102  Diastolic 79 - 62  Pulse 69 - 62    Blood pressure 120/79, pulse 69, temperature 98.3 F (36.8  C), temperature source Oral, resp. rate 20, height 5\' 6"  (1.676 m), weight 93.5 kg, SpO2 98 %.    Physical Exam  Constitutional:  Mildly obese in no acute distress  Neck: No thyromegaly present.  Cardiovascular: Normal rate, regular rhythm, normal heart sounds and intact distal pulses. Exam reveals no gallop.  No murmur heard. 1-2+  leg edema, no JVD.  Pulmonary/Chest: Effort normal and breath sounds normal.  Abdominal: Soft. Bowel sounds are normal.  Musculoskeletal:     Cervical back: Neck supple.  Skin: Skin is warm and dry.   Laboratory examination:   Recent Labs    01/09/20 0831  NA 142  K 4.8  CL 108  CO2 22  GLUCOSE 88  BUN 68*  CREATININE 3.58*  CALCIUM 9.0  GFRNONAA 15*  GFRAA 18*   estimated creatinine clearance is 18.5 mL/min (A) (by C-G formula based on SCr of 3.58 mg/dL (H)).  CMP Latest Ref Rng & Units 01/09/2020 12/01/2018 12/01/2018  Glucose 70 - 99 mg/dL 88 110(H) 111(H)  BUN 8 - 23 mg/dL 68(H) 37(H) 35(H)  Creatinine 0.61 - 1.24 mg/dL 3.58(H) 2.22(H) 2.50(H)  Sodium 135 - 145 mmol/L 142 139 140  Potassium 3.5 - 5.1 mmol/L 4.8 4.6 5.2(H)  Chloride 98 - 111 mmol/L 108 102 102  CO2 22 - 32 mmol/L 22 29 -  Calcium 8.9 - 10.3 mg/dL 9.0 8.8(L) -  Total Protein 6.0 - 8.3 g/dL - - -  Total Bilirubin 0.3 - 1.2 mg/dL - - -  Alkaline Phos 39 - 117 U/L - - -  AST 0 - 37 U/L - - -  ALT 0 - 53 U/L - - -   CBC Latest Ref Rng & Units 01/09/2020 12/01/2018 04/18/2014  WBC 4.0 - 10.5 K/uL 9.3 - 11.7(H)  Hemoglobin 13.0 - 17.0 g/dL 11.8(L) 12.6(L) 10.4(L)  Hematocrit 39.0 - 52.0 % 36.5(L) 37.0(L) 30.7(L)  Platelets 150 - 400 K/uL 203 - 188   Lipid Panel     Component Value Date/Time   CHOL 96 03/17/2013 0800   TRIG 75 03/17/2013 0800   HDL 51 03/17/2013 0800  CHOLHDL 1.9 03/17/2013 0800   VLDL 15 03/17/2013 0800   LDLCALC 30 03/17/2013 0800   HEMOGLOBIN A1C Lab Results  Component Value Date   HGBA1C 6.1 (H) 01/09/2020   MPG 128.37 01/09/2020   TSH No  results for input(s): TSH in the last 8760 hours. BNP (last 3 results) Recent Labs    01/09/20 0847  BNP 201.8*    Ref Range & Units 10:47 01/09/20  08:31  Troponin I (High Sensitivity) <18 ng/L 20High   19High  CM     Medications and allergies  No Known Allergies   . sodium chloride    . sodium chloride 50 mL/hr at 01/09/20 1647  . [START ON 01/10/2020] sodium chloride    . sodium chloride 100 mL/hr at 01/09/20 2036    Current Outpatient Medications  Medication Instructions  . albuterol (PROAIR HFA) 108 (90 Base) MCG/ACT inhaler 2 puffs, Inhalation, Every 6 hours PRN  . amLODipine (NORVASC) 10 mg, Oral, Daily  . aspirin EC 81 mg, Oral, Daily  . carvedilol (COREG) 6.25 mg, Oral, 2 times daily  . clopidogrel (PLAVIX) 75 mg, Oral, Daily  . cyanocobalamin ((VITAMIN B-12)) 1,000 mcg, Intramuscular, Every 30 days  . DM-Doxylamine-Acetaminophen (NYQUIL HBP COLD & FLU) 15-6.25-325 MG/15ML LIQD 15 mLs, Oral, Every 6 hours PRN  . Dulaglutide (TRULICITY) 1.5 mg, Subcutaneous, Every Sat  . Febuxostat (ULORIC) 80 mg, Oral, Daily  . furosemide (LASIX) 40 mg, Oral, Daily  . HYDROcodone-acetaminophen (NORCO/VICODIN) 5-325 MG tablet 1 tablet, Oral, Every 6 hours PRN  . Insulin Glargine (TOUJEO SOLOSTAR) 24-26 Units, Subcutaneous, Daily  . insulin lispro (HUMALOG KWIKPEN) 12 Units, Subcutaneous, 2 times daily  . Melatonin 5 mg, Sublingual, Daily at bedtime  . nitroGLYCERIN (NITROSTAT) 0.4 MG SL tablet DISSOLVE 1 TABLET UNDER TONGUE EVERY 5 MINUTES UP TO 15 MIN FOR CHEST PAIN. IF NO RELIEF CALL 911.  . olmesartan (BENICAR) 20 mg, Oral, Daily  . omeprazole (PRILOSEC) 40 mg, Oral, Daily  . pregabalin (LYRICA) 75 mg, Oral, 3 times daily  . rosuvastatin (CRESTOR) 20 mg, Oral, Daily  . tadalafil (CIALIS) 5 mg, Oral, Daily at bedtime  . ticagrelor (BRILINTA) 90 mg, Oral, 2 times daily  . Vitamin D3 5,000 Units, Oral, Daily  . zolpidem (AMBIEN) 10 mg, Oral, Daily at bedtime    I/O last 3  completed shifts: In: 3976 [P.O.:720; IV Piggyback:500] Out: 750 [Urine:750] No intake/output data recorded.   Radiology:   Imaging: DG Chest Port 1 View  Result Date: 01/09/2020 CLINICAL DATA:  Chest pain EXAM: PORTABLE CHEST 1 VIEW COMPARISON:  04/08/2014 FINDINGS: Post CABG changes with multiple fractured median sternotomy wires, as seen on previous study. Heart size is within normal limits. No focal airspace consolidation, pleural effusion, or pneumothorax. IMPRESSION: No acute cardiopulmonary findings. Electronically Signed   By: Davina Poke D.O.   On: 01/09/2020 08:59    Cardiac Studies:   ABI 12/29/2017: Unable to obtain due to medial calcinosis.  Bilateral toe brachial indices are normal.  Echocardiogram  11/30/2018: 1. Poor echo window, wall motion with reduced sensitivity. Left ventricle cavity is normal in size. Normal left ventricular shape. Abnormal septal wall motion due to post-operative coronary artery bypass graft. Doppler evidence of grade II (pseudonormal) diastolic dysfunction. Diastolic dysfunction findings suggests elevated LA/LV end diastolic pressure. Calculated EF 56%. 2. Mild (Grade I) mitral regurgitation. 3. Mild to moderate tricuspid regurgitation. Mild pulmonary hypertension. Estimated pulmonary artery systolic pressure 32 mmHg.  Echocardiogram At [facility], SC11/25/2020: Normal LV  systolic function EF 00%, grade 1 diastolic dysfunction.  No significant valvular abnormality.  Coronary angiogram 11/13/2019 at [facility], [place]: Distal left main 80% stenosed, LAD occluded proximally, Free RIMA to LAD patent.  Left circumflex is small.  SVG to OM 2 and 3 has large thrombus burden and is occluded just before OM 3.  S/P thrombectomy followed by 3.5 x 38 mm Synergy and distally 2.25 x 12 mm Synergy DES. RCA is codominant.  There are tandem 80% stenosis in the mid and distal vessel.  Right PDA is chronically  occluded.  SVG to RCA has a 70% proximal stenosis prior to a widely patent stent in the midportion of the graft.  Distal to the stent 80% tandem stenosis with good runoff.  Assessment   1.  Unstable angina pectoris 2.  Acute on chronic stage IV kidney disease, suspect recent contrast exposure and contrast nephropathy.  Presently is being hydrated and ARB is on hold. 3.  History of CABG x3 with Free RIMA to LAD, SVG to OM 2 and SVG to RCA in 1991 4.  Diabetes mellitus type 2 uncontrolled with renal complication. 5. Dyspnea on exertion probably due to acute on chronic diastolic CHF. Renal failure and decreased UOP over the past 1 week could also be reason for volume excess.  EKG 12/31/2019: Normal sinus rhythm at the rate of 74 bpm, normal axis, poor R wave progression, cannot exclude anteroseptal infarct old.  Nonspecific T abnormality.  Frequent PVCs in bigeminal pattern.  Low-voltage complexes.  EKG 11/29/2019: Normal sinus rhythm at rate of 60 bpm, left atrial abnormality, normal axis.  T wave abnormality, lateral ischemia.  Single PVC.  Compared to 11/19/2018, lateral T wave abnormality new.  Recommendations:  Allen Medina  is a 79 y.o. Caucasian male   with type 2 diabetes, stage IV CKD being followed by Dr. 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   Patient is presenting with unstable angina pectoris and I suspect the SVG to RCA which appeared very unstable  could be the etiology but also recent angioplasty to SVG to circumflex was suboptimal.  I reviewed his angiograms done on 11/13/2019 and suspect he probably had low flow or no reflow after intervention.  This may have already completed the infarct.  Serum troponins are negative so far.  I will hydrate him well tonight given normal LVEF, and consider limited angiography specifically to look for SVG to OM and SVG to RCA.  Risk of contrast nephropathy is extremely high and he may end up needing dialysis.  Other option is that as patients native vessels are relatively small, it may not be life-threatening, we could certainly consider medical therapy only. I discussed with the patient that the risk for renal failure and need for dialysis with his presentation is extremely high. Patient prefers medical therapy for now. We could consider angiogram once his renal function is stable. Echo images not uploaded yet.   C/O abdominal discomfort. ? Etiology ?  Diabetic autonomic insufficiency as he has chronic diarrhea?    Adrian Prows, MD, [facility] 01/09/2020, 9:51 PM [facility] [place] Pager: [phone] Office: [phone]

## 2020-01-10 ENCOUNTER — Ambulatory Visit: Payer: Medicare Other | Admitting: Cardiology

## 2020-01-10 ENCOUNTER — Observation Stay (HOSPITAL_COMMUNITY): Payer: Medicare PPO

## 2020-01-10 ENCOUNTER — Ambulatory Visit: Payer: Medicare Other

## 2020-01-10 ENCOUNTER — Encounter (HOSPITAL_COMMUNITY)
Admission: EM | Disposition: A | Discharge status: Home / Self Care | Payer: Self-pay | Source: Home / Self Care | Attending: Family Medicine

## 2020-01-10 DIAGNOSIS — I25110 Atherosclerotic heart disease of native coronary artery with unstable angina pectoris: Secondary | ICD-10-CM | POA: Diagnosis present

## 2020-01-10 DIAGNOSIS — E669 Obesity, unspecified: Secondary | ICD-10-CM | POA: Diagnosis present

## 2020-01-10 DIAGNOSIS — Z6833 Body mass index (BMI) 33.0-33.9, adult: Secondary | ICD-10-CM | POA: Diagnosis not present

## 2020-01-10 DIAGNOSIS — E1122 Type 2 diabetes mellitus with diabetic chronic kidney disease: Secondary | ICD-10-CM | POA: Diagnosis present

## 2020-01-10 DIAGNOSIS — H5462 Unqualified visual loss, left eye, normal vision right eye: Secondary | ICD-10-CM | POA: Diagnosis present

## 2020-01-10 DIAGNOSIS — E785 Hyperlipidemia, unspecified: Secondary | ICD-10-CM | POA: Diagnosis present

## 2020-01-10 DIAGNOSIS — I5033 Acute on chronic diastolic (congestive) heart failure: Secondary | ICD-10-CM | POA: Diagnosis present

## 2020-01-10 DIAGNOSIS — Z7982 Long term (current) use of aspirin: Secondary | ICD-10-CM | POA: Diagnosis not present

## 2020-01-10 DIAGNOSIS — Z66 Do not resuscitate: Secondary | ICD-10-CM | POA: Diagnosis present

## 2020-01-10 DIAGNOSIS — E11649 Type 2 diabetes mellitus with hypoglycemia without coma: Secondary | ICD-10-CM | POA: Diagnosis not present

## 2020-01-10 DIAGNOSIS — E1151 Type 2 diabetes mellitus with diabetic peripheral angiopathy without gangrene: Secondary | ICD-10-CM | POA: Diagnosis present

## 2020-01-10 DIAGNOSIS — I2510 Atherosclerotic heart disease of native coronary artery without angina pectoris: Secondary | ICD-10-CM | POA: Diagnosis not present

## 2020-01-10 DIAGNOSIS — R0789 Other chest pain: Secondary | ICD-10-CM | POA: Diagnosis present

## 2020-01-10 DIAGNOSIS — E1142 Type 2 diabetes mellitus with diabetic polyneuropathy: Secondary | ICD-10-CM | POA: Diagnosis present

## 2020-01-10 DIAGNOSIS — I130 Hypertensive heart and chronic kidney disease with heart failure and stage 1 through stage 4 chronic kidney disease, or unspecified chronic kidney disease: Secondary | ICD-10-CM | POA: Diagnosis present

## 2020-01-10 DIAGNOSIS — K224 Dyskinesia of esophagus: Secondary | ICD-10-CM | POA: Diagnosis present

## 2020-01-10 DIAGNOSIS — I25810 Atherosclerosis of coronary artery bypass graft(s) without angina pectoris: Secondary | ICD-10-CM | POA: Diagnosis present

## 2020-01-10 DIAGNOSIS — Z794 Long term (current) use of insulin: Secondary | ICD-10-CM | POA: Diagnosis not present

## 2020-01-10 DIAGNOSIS — N184 Chronic kidney disease, stage 4 (severe): Secondary | ICD-10-CM | POA: Diagnosis present

## 2020-01-10 DIAGNOSIS — G8929 Other chronic pain: Secondary | ICD-10-CM | POA: Diagnosis present

## 2020-01-10 DIAGNOSIS — I252 Old myocardial infarction: Secondary | ICD-10-CM | POA: Diagnosis not present

## 2020-01-10 DIAGNOSIS — Z7902 Long term (current) use of antithrombotics/antiplatelets: Secondary | ICD-10-CM | POA: Diagnosis not present

## 2020-01-10 DIAGNOSIS — I200 Unstable angina: Secondary | ICD-10-CM | POA: Diagnosis not present

## 2020-01-10 DIAGNOSIS — Z20822 Contact with and (suspected) exposure to covid-19: Secondary | ICD-10-CM | POA: Diagnosis present

## 2020-01-10 DIAGNOSIS — M545 Low back pain: Secondary | ICD-10-CM | POA: Diagnosis present

## 2020-01-10 DIAGNOSIS — I129 Hypertensive chronic kidney disease with stage 1 through stage 4 chronic kidney disease, or unspecified chronic kidney disease: Secondary | ICD-10-CM | POA: Diagnosis not present

## 2020-01-10 DIAGNOSIS — N179 Acute kidney failure, unspecified: Secondary | ICD-10-CM | POA: Diagnosis present

## 2020-01-10 DIAGNOSIS — Z981 Arthrodesis status: Secondary | ICD-10-CM | POA: Diagnosis not present

## 2020-01-10 LAB — LIPID PANEL
Cholesterol: 88 mg/dL (ref 0–200)
HDL: 33 mg/dL — ABNORMAL LOW (ref >40)
LDL Cholesterol: 32 mg/dL (ref 0–99)
Total CHOL/HDL Ratio: 2.7 RATIO
Triglycerides: 113 mg/dL (ref <150)
VLDL: 23 mg/dL (ref 0–40)

## 2020-01-10 LAB — CBC
HCT: 32.8 % — ABNORMAL LOW (ref 39.0–52.0)
Hemoglobin: 10.8 g/dL — ABNORMAL LOW (ref 13.0–17.0)
MCH: 32 pg (ref 26.0–34.0)
MCHC: 32.9 g/dL (ref 30.0–36.0)
MCV: 97 fL (ref 80.0–100.0)
Platelets: 173 10*3/uL (ref 150–400)
RBC: 3.38 MIL/uL — ABNORMAL LOW (ref 4.22–5.81)
RDW: 12.6 % (ref 11.5–15.5)
WBC: 8.2 10*3/uL (ref 4.0–10.5)
nRBC: 0 % (ref 0.0–0.2)

## 2020-01-10 LAB — ECHOCARDIOGRAM COMPLETE
Height: 66 in
Weight: 3278.4 oz

## 2020-01-10 LAB — BASIC METABOLIC PANEL
Anion gap: 11 (ref 5–15)
BUN: 64 mg/dL — ABNORMAL HIGH (ref 8–23)
CO2: 22 mmol/L (ref 22–32)
Calcium: 8.4 mg/dL — ABNORMAL LOW (ref 8.9–10.3)
Chloride: 105 mmol/L (ref 98–111)
Creatinine, Ser: 3.24 mg/dL — ABNORMAL HIGH (ref 0.61–1.24)
GFR calc Af Amer: 20 mL/min — ABNORMAL LOW (ref >60)
GFR calc non Af Amer: 17 mL/min — ABNORMAL LOW (ref >60)
Glucose, Bld: 129 mg/dL — ABNORMAL HIGH (ref 70–99)
Potassium: 4.4 mmol/L (ref 3.5–5.1)
Sodium: 138 mmol/L (ref 135–145)

## 2020-01-10 LAB — GLUCOSE, CAPILLARY
Glucose-Capillary: 41 mg/dL — CL (ref 70–99)
Glucose-Capillary: 72 mg/dL (ref 70–99)
Glucose-Capillary: 113 mg/dL — ABNORMAL HIGH (ref 70–99)
Glucose-Capillary: 74 mg/dL (ref 70–99)
Glucose-Capillary: 140 mg/dL — ABNORMAL HIGH (ref 70–99)
Glucose-Capillary: 135 mg/dL — ABNORMAL HIGH (ref 70–99)

## 2020-01-10 LAB — MAGNESIUM: Magnesium: 1.7 mg/dL (ref 1.7–2.4)

## 2020-01-10 SURGERY — LEFT HEART CATH AND CORS/GRAFTS ANGIOGRAPHY
Anesthesia: LOCAL

## 2020-01-10 MED ORDER — INSULIN ASPART 100 UNIT/ML ~~LOC~~ SOLN
5.0000 [IU] | Freq: Three times a day (TID) | SUBCUTANEOUS | Status: DC
  Administered 2020-01-10 – 2020-01-11 (×2): 5 [IU] via SUBCUTANEOUS

## 2020-01-10 MED ORDER — TECHNETIUM TO 99M ALBUMIN AGGREGATED
1.6200 | Freq: Once | INTRAVENOUS | Status: AC | PRN
  Administered 2020-01-10: 1.62 via INTRAVENOUS

## 2020-01-10 MED ORDER — INSULIN GLARGINE 100 UNIT/ML ~~LOC~~ SOLN
12.0000 [IU] | Freq: Every day | SUBCUTANEOUS | Status: DC
  Administered 2020-01-11: 12 [IU] via SUBCUTANEOUS
  Filled 2020-01-10: qty 0.12

## 2020-01-10 MED ORDER — ENOXAPARIN SODIUM 30 MG/0.3ML ~~LOC~~ SOLN
30.0000 mg | SUBCUTANEOUS | Status: DC
  Administered 2020-01-10: 30 mg via SUBCUTANEOUS
  Filled 2020-01-10: qty 0.3

## 2020-01-10 NOTE — Progress Notes (Signed)
Inpatient Diabetes Program Recommendations  AACE/ADA: New Consensus Statement on Inpatient Glycemic Control (2015)  Target Ranges:  Prepandial:   less than 140 mg/dL      Peak postprandial:   less than 180 mg/dL (1-2 hours)      Critically ill patients:  140 - 180 mg/dL   Lab Results  Component Value Date   GLUCAP 113 (H) 01/10/2020   HGBA1C 6.1 (H) 01/09/2020    Review of Glycemic Control Results for LYON, DUMONT (MRN [id number]) as of 01/10/2020 10:05  Ref. Range 01/09/2020 20:36 01/10/2020 02:50 01/10/2020 03:08 01/10/2020 06:27  Glucose-Capillary Latest Ref Range: 70 - 99 mg/dL 71 41 (LL) 72 113 (H)   Diabetes history: Type 2 DM Outpatient Diabetes medications: Trulicity 1.5 mg Q week, Toujeo 24-26 units QD, Humalog 12 units BID Current orders for Inpatient glycemic control: Lantus 24 units QD, Novolog 12 units BID, Novolog 0-15 units TID, Novolog 0-5 units QHS  Inpatient Diabetes Program Recommendations:    Noted hypoglycemia of 41 mg/dL following basal insulin administration.   Consider decreasing Lantus to 12 units QD and Novolog 6 units TID.   Thanks, Bronson Curb, MSN, RNC-OB Diabetes Coordinator [phone] (8a-5p)

## 2020-01-10 NOTE — Plan of Care (Signed)

## 2020-01-10 NOTE — Progress Notes (Addendum)
Patient called to the front desk to let staff know he felt his CBG was low. Another RN checked it=43. RN says she gave crackers and juice. Will recheck.  Patients CBG now 72

## 2020-01-10 NOTE — Progress Notes (Signed)
Admit: 01/09/2020 LOS: 0  13M CKD3 (BL SCr around 2.0) with [place], sig CAD and STEMI 10/2019; hxo CABG + Redo  Subjective:  . Decision made for medical management of [place] at current time . Creatinine improved from 3.58-3.2 overnight . Denies chest pain or dyspnea at the current time . Patient provides labs from 11/26 from [facility] showing a creatinine of 1.9. . No significant edema . Now off IV fluids . Excellent UOP . Renal ultrasound with normal-sized kidneys with increased cortical echogenicity and thinning, no obstruction or hydronephrosis  01/21 0701 - 01/22 0700 In: 2478.1 [P.O.:1200; I.V.:778.1; IV Piggyback:500] Out: 1950 [SWNIO:2703]  Filed Weights   01/09/20 1823 01/10/20 0329  Weight: 93.5 kg 92.9 kg    Scheduled Meds: . amLODipine  10 mg Oral Daily  . aspirin EC  81 mg Oral Daily  . carvedilol  6.25 mg Oral BID  . cholecalciferol  5,000 Units Oral Daily  . enoxaparin (LOVENOX) injection  30 mg Subcutaneous Q24H  . febuxostat  80 mg Oral Daily  . insulin aspart  0-15 Units Subcutaneous TID WC  . insulin aspart  0-5 Units Subcutaneous QHS  . insulin aspart  5 Units Subcutaneous TID WC  . [START ON 01/11/2020] insulin glargine  12 Units Subcutaneous Daily  . Melatonin  6 mg Sublingual QHS  . pantoprazole  80 mg Oral Daily  . pregabalin  75 mg Oral BID  . rosuvastatin  10 mg Oral Daily  . sodium chloride flush  3 mL Intravenous Q12H  . ticagrelor  90 mg Oral BID  . zolpidem  5 mg Oral QHS   Continuous Infusions: . sodium chloride    . sodium chloride    . sodium chloride 100 mL/hr at 01/10/20 0418   PRN Meds:.sodium chloride, acetaminophen, albuterol, HYDROcodone-acetaminophen, nitroGLYCERIN, ondansetron (ZOFRAN) IV, sodium chloride flush  Current Labs: reviewed  11/14/19 SCR 1.9    Physical Exam:  Blood pressure 133/74, pulse 67, temperature (!) 97.5 F (36.4 C), temperature source Oral, resp. rate 16, height 5\' 6"  (1.676 m), weight  92.9 kg, SpO2 99 %. NAD, lying flat in the bed, normal work of breathing Regular, normal S1 and S2 Bibasilar faint crackles, otherwise clear Trace lower extremity edema NCAT EOMI AAOx3  A 1. AoCKD3, BL SCr 1.9 10/2019; Sees Patel at [facility] 2.  [place], sig hx/o CAD/CABG and STEMI 10/2019; per CV 3. HTN, holding ARB, BP stable 4. DM2  P . Agree with holding IV fluids given current plan is not to pursue cardiac catheterization . Would not restart ARB at this time . Continue supportive care, I do not think he needs Lasix today . Daily weights, Daily Renal Panel, Strict I/Os, Avoid nephrotoxins (NSAIDs, judicious IV Contrast)   Pearson Grippe MD 01/10/2020, 1:21 PM  Recent Labs  Lab 01/09/20 0831 01/10/20 0519  NA 142 138  K 4.8 4.4  CL 108 105  CO2 22 22  GLUCOSE 88 129*  BUN 68* 64*  CREATININE 3.58* 3.24*  CALCIUM 9.0 8.4*   Recent Labs  Lab 01/09/20 0831 01/10/20 0519  WBC 9.3 8.2  HGB 11.8* 10.8*  HCT 36.5* 32.8*  MCV 98.6 97.0  PLT 203 173

## 2020-01-10 NOTE — Progress Notes (Signed)
  Echocardiogram 2D Echocardiogram has been performed.  Allen Medina 01/10/2020, 9:47 AM

## 2020-01-10 NOTE — Plan of Care (Signed)
  Problem: Education: Goal: Knowledge of General Education information will improve Description: Including pain rating scale, medication(s)/side effects and non-pharmacologic comfort measures Outcome: Progressing   Problem: Health Behavior/Discharge Planning: Goal: Ability to manage health-related needs will improve Outcome: Progressing   Problem: Pain Managment: Goal: General experience of comfort will improve Outcome: Progressing   

## 2020-01-10 NOTE — Progress Notes (Signed)
Subjective:  Feels much improved with regards to dyspnea. No specific complaints  Intake/Output from previous day:  I/O last 3 completed shifts: In: 2478.1 [P.O.:1200; I.V.:778.1; IV Piggyback:500] Out: 1950 [Urine:1950] Total I/O In: 240 [P.O.:240] Out: 300 [Urine:300]  Blood pressure 133/74, pulse 67, temperature (!) 97.5 F (36.4 C), temperature source Oral, resp. rate 16, height 5' 6"  (1.676 m), weight 92.9 kg, SpO2 99 %.  Vitals with BMI 01/10/2020 01/10/2020 01/10/2020  Height - - -  Weight - - -  BMI - - -  Systolic 798 921 194  Diastolic 63 74 74  Pulse 58 67 -    Physical Exam  Constitutional: He appears well-developed. No distress.  Mildly obese  HENT:  Head: Atraumatic.  Eyes: Conjunctivae are normal.  Neck: No thyromegaly present.  Cardiovascular: Normal rate, regular rhythm, normal heart sounds and intact distal pulses. Exam reveals no gallop.  No murmur heard. No edema.  No JVD  Pulmonary/Chest: Effort normal and breath sounds normal.  Abdominal: Soft. Bowel sounds are normal.  Musculoskeletal:        General: Normal range of motion.     Cervical back: Neck supple.  Neurological: He is alert.  Skin: Skin is warm and dry.  Psychiatric: He has a normal mood and affect.   Lab Results: BMP BNP (last 3 results) Recent Labs    01/09/20 0847  BNP 201.8*    ProBNP (last 3 results) No results for input(s): PROBNP in the last 8760 hours. BMP Latest Ref Rng & Units 01/10/2020 01/09/2020 12/01/2018  Glucose 70 - 99 mg/dL 129(H) 88 110(H)  BUN 8 - 23 mg/dL 64(H) 68(H) 37(H)  Creatinine 0.61 - 1.24 mg/dL 3.24(H) 3.58(H) 2.22(H)  Sodium 135 - 145 mmol/L 138 142 139  Potassium 3.5 - 5.1 mmol/L 4.4 4.8 4.6  Chloride 98 - 111 mmol/L 105 108 102  CO2 22 - 32 mmol/L 22 22 29   Calcium 8.9 - 10.3 mg/dL 8.4(L) 9.0 8.8(L)   Hepatic Function Latest Ref Rng & Units 03/17/2013 07/26/2010 10/15/2009  Total Protein 6.0 - 8.3 g/dL 7.2 7.3 7.9  Albumin 3.5 - 5.2 g/dL 3.8 4.3  4.5  AST 0 - 37 U/L 19 25 26   ALT 0 - 53 U/L 20 24 28   Alk Phosphatase 39 - 117 U/L 86 60 54  Total Bilirubin 0.3 - 1.2 mg/dL 0.4 0.8 0.8   CBC Latest Ref Rng & Units 01/10/2020 01/09/2020 12/01/2018  WBC 4.0 - 10.5 K/uL 8.2 9.3 -  Hemoglobin 13.0 - 17.0 g/dL 10.8(L) 11.8(L) 12.6(L)  Hematocrit 39.0 - 52.0 % 32.8(L) 36.5(L) 37.0(L)  Platelets 150 - 400 K/uL 173 203 -   Lipid Panel     Component Value Date/Time   CHOL 88 01/10/2020 0519   TRIG 113 01/10/2020 0519   HDL 33 (L) 01/10/2020 0519   CHOLHDL 2.7 01/10/2020 0519   VLDL 23 01/10/2020 0519   LDLCALC 32 01/10/2020 0519   Cardiac Panel (last 3 results) No results for input(s): CKTOTAL, CKMB, TROPONINI, RELINDX in the last 72 hours.  HEMOGLOBIN A1C Lab Results  Component Value Date   HGBA1C 6.1 (H) 01/09/2020   MPG 128.37 01/09/2020   TSH No results for input(s): TSH in the last 8760 hours. 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 Bilateral toe brachial indices are normal.  Echocardiogram  11/30/2018: 1. Poor echo window, wall motion with reduced sensitivity. Left ventricle cavity is normal in size.  Normal left ventricular shape. Abnormal septal wall motion due to post-operative coronary artery bypass graft. Doppler evidence of grade II (pseudonormal) diastolic dysfunction. Diastolic dysfunction findings suggests elevated LA/LV end diastolic pressure. Calculated EF 56%. 2. Mild (Grade I) mitral regurgitation. 3. Mild to moderate tricuspid regurgitation. Mild pulmonary hypertension. Estimated pulmonary artery systolic pressure 32 mmHg.   [facility] [place]: Distal left main 80% stenosed, LAD occluded proximally, Free RIMA to LAD patent. Left circumflex is small. SVG to OM 2 and 3 has large thrombus burden and is occluded just before OM 3. S/P thrombectomy followed by  3.5 x 38 mm Synergy and distally 2.25 x 12 mm Synergy DES. RCA is codominant. There are tandem 80% stenosis in the mid and distal vessel. Right PDA is chronically occluded. SVG to RCA has a 70% proximal stenosis prior to a widely patent stent in the midportion of the graft. Distal to the stent 80% tandem stenosis with good runoff.  Echocardiogram 01/10/2020:   The left ventricle has normal function. There is no left ventricular hypertrophy. Mild hypokinesis of the left ventricular, entire inferolateral wall. Elevated left atrial and left ventricular end-diastolic pressures. Left ventricular diastolic parameters are consistent with Grade II diastolic dysfunction (pseudonormalization). Left ventricular ejection fraction by PLAX is is 54 %. Left ventricular ejection fraction, by visual estimation, is 50 to 55%. Left atrial size was moderately dilated.The mitral valve is normal in structure. Mild mitral valve regurgitation.The aortic valve is tricuspid. Aortic valve regurgitation is not visualized. Mild aortic valve sclerosis without stenosis. No significant change from  [facility]  EKG 12/31/2019: Normal sinus rhythm at the rate of 74 bpm, normal axis, poor R wave progression, cannot exclude anteroseptal infarct old.  Nonspecific T abnormality.  Frequent PVCs in bigeminal pattern.  Low-voltage complexes.  EKG 11/29/2019: Normal sinus rhythm at rate of 60 bpm, left atrial abnormality, normal axis. T wave abnormality, lateral ischemia. Single PVC. Compared to 11/19/2018, lateral T wave abnormality new.  Scheduled Meds: . amLODipine  10 mg Oral Daily  . aspirin EC  81 mg Oral Daily  . carvedilol  6.25 mg Oral BID  . cholecalciferol  5,000 Units Oral Daily  . febuxostat  80 mg Oral Daily  . insulin aspart  0-15 Units Subcutaneous TID WC  . insulin aspart  0-5 Units Subcutaneous QHS  . insulin aspart  5 Units Subcutaneous TID WC  . [START ON  01/11/2020] insulin glargine  12 Units Subcutaneous Daily  . Melatonin  6 mg Sublingual QHS  . pantoprazole  80 mg Oral Daily  . pregabalin  75 mg Oral BID  . rosuvastatin  10 mg Oral Daily  . sodium chloride flush  3 mL Intravenous Q12H  . ticagrelor  90 mg Oral BID  . zolpidem  5 mg Oral QHS   Continuous Infusions: . sodium chloride    . sodium chloride    . sodium chloride 100 mL/hr at 01/10/20 0418   PRN Meds:.sodium chloride, acetaminophen, albuterol, HYDROcodone-acetaminophen, nitroGLYCERIN, ondansetron (ZOFRAN) IV, sodium chloride flush  Assessment/Plan:  1. Acute on chronic diastolic CHF 2. Unstable angina 3. 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   Rec: Medical management of CAD and no plans on cardiac cath. Met his wife and updated. D/C Plavix and restart Brilinta as his symptoms were unrelated to the medication. Will see in the office. Stable cardiac status. Echo reviewed.  BP is controlled.  On statins and BB and lipids  are at goal.  Renal function now coming back to baseline.    Adrian Prows, M.D. 01/10/2020, 11:00 AM [facility]

## 2020-01-10 NOTE — Progress Notes (Signed)
PROGRESS NOTE    CLAUDIO MONDRY  QQP:[id number] DOB: 1941-09-15 DOA: 01/09/2020 PCP: Marton Redwood, MD   Brief Narrative:  HPI: Allen Medina is a 79 y.o. male with medical history significant of hypertension, hyperlipidemia, chronic kidney disease stage IV, diabetes type 2, coronary artery disease status post CABG x4 with a myocardial infarction in November 2020 (treated in [facility]) who presents the emergency department complaining of sudden onset of intermittent substernal chest pain lasting about a week.  He has had constant shortness of breath associated with it for the past week worsened with exertion.  Had 3 pound weight gain in the past weeks feels a bit tighter in his feet but his hands feel about the same note tense skin.  Chest pain has become more more constant in nature over the past several days.  He woke up today with chest pain.  A recent myocardial infarction in November he received a stent and recently took himself off his Brilinta about a week ago because he felt that his stomach was hurting because of it.  He had a cardiologist to notify them of taking himself off the Brilinta and had not heard back from them.  Does have an appointment with his cardiologist tomorrow as well as an appointment for his Covid vaccine.  Patient describes his substernal chest pain is feeling like he is choking substernal.  Review of his records shows that he had an esophagram in 2018 which showed tertiary contractions.  ED Course: Chest x-ray unremarkable, CBC without leukocytosis, hemoglobin stable, atony markedly elevated last one we have in our system was a 2.2.  Today he is 3.58.  BNP mildly elevated at 201.8.  No evidence of fluid overload.  Dimer mildly elevated at 1.41.  Cannot obtain CTA due to patient in creatinine.  Heart score is 6 with significant risk factors.  Case discussed with cardiology who plans cardiac catheterization in a.m.  Assessment & Plan:   Principal Problem:  Unstable angina (HCC) Active Problems:   Diabetes (HCC)   CKD (chronic kidney disease) stage 4, GFR 15-29 ml/min (HCC)   CAD, multiple vessel; native LAD and circumflex 100% occluded; distal RCA severely diseased with possible PL or RPDA occluded   Peripheral vascular disease (HCC)   HTN (hypertension)   Obesity (BMI 30.0-34.9)   Esophageal dysmotility   Unstable angina with history of significant CAD status post CABG x2: Patient is chest pain-free.  Getting echo now.  Troponin very slightly elevated but flat.  Cardiology on board.  Plan for continuing medical management for now and possible cardiac catheterization once his renal function allows.  He is on Brilinta.  Will defer management to cardiology.  Appreciate their help.  Esophageal dysmotility: GI on board.  Awaiting their recommendations.  Acute on chronic kidney disease stage IV: Sees Dr. Graylon Gunning as outpatient.  Based on chart review, his baseline creatinine seems to be around 2.0 with the last 1 checked in December 2019 was 2.22.  He came in with 3.58.  Improving.  3.24 today.  Nephrology on board.  He is on gentle IV hydration.  Appreciate nephrology help and defer management to them.  Diabetes type 2: Had hypoglycemia this morning.  Will reduce Lantus to 12 units and premeal NovoLog to only 5 units 3 times daily.  Peripheral vascular disease: Noted continue home medication management.  Essential hypertension: Controlled.  Continue home medications.  Obesity: Noted weight loss recommended.  DVT prophylaxis: We will order Lovenox Code Status: Full  code Family Communication:  None present at bedside.  Plan of care discussed with patient in length and he verbalized understanding and agreed with it. Disposition Plan: We will likely be discharged home once medically improved and cleared by cardiology and nephrology.  Unable to predict day of discharge.  Estimated body mass index is 33.07 kg/m as calculated from the  following:   Height as of this encounter: 5\' 6"  (1.676 m).   Weight as of this encounter: 92.9 kg.      Nutritional status:               Consultants:   Nephrology and cardiology  Procedures:   None  Antimicrobials:   None   Subjective: Seen and examined.  Feels better than yesterday.  No more chest pain at this point in time.  No shortness of breath.  No other complaint.  Objective: Vitals:   01/10/20 0329 01/10/20 0331 01/10/20 0937 01/10/20 0945  BP:  (!) 116/57 133/74 133/74  Pulse:  60  67  Resp:  16    Temp:  (!) 97.5 F (36.4 C)    TempSrc:  Oral    SpO2:  100%  99%  Weight: 92.9 kg     Height:        Intake/Output Summary (Last 24 hours) at 01/10/2020 1118 Last data filed at 01/10/2020 0930 Gross per 24 hour  Intake 2718.11 ml  Output 2250 ml  Net 468.11 ml   Filed Weights   01/09/20 1823 01/10/20 0329  Weight: 93.5 kg 92.9 kg    Examination:  General exam: Appears calm and comfortable  Respiratory system: Clear to auscultation. Respiratory effort normal. Cardiovascular system: S1 & S2 heard, RRR. No JVD, murmurs, rubs, gallops or clicks.  Trace pedal edema bilateral lower extremity Gastrointestinal system: Abdomen is nondistended, soft and nontender. No organomegaly or masses felt. Normal bowel sounds heard. Central nervous system: Alert and oriented. No focal neurological deficits. Extremities: Symmetric 5 x 5 power. Skin: No rashes, lesions or ulcers Psychiatry: Judgement and insight appear normal. Mood & affect appropriate.    Data Reviewed: I have personally reviewed following labs and imaging studies  CBC: Recent Labs  Lab 01/09/20 0831 01/10/20 0519  WBC 9.3 8.2  HGB 11.8* 10.8*  HCT 36.5* 32.8*  MCV 98.6 97.0  PLT 203 791   Basic Metabolic Panel: Recent Labs  Lab 01/09/20 0831 01/10/20 0519  NA 142 138  K 4.8 4.4  CL 108 105  CO2 22 22  GLUCOSE 88 129*  BUN 68* 64*  CREATININE 3.58* 3.24*  CALCIUM 9.0 8.4*   MG  --  1.7   GFR: Estimated Creatinine Clearance: 20.4 mL/min (A) (by C-G formula based on SCr of 3.24 mg/dL (H)). Liver Function Tests: No results for input(s): AST, ALT, ALKPHOS, BILITOT, PROT, ALBUMIN in the last 168 hours. No results for input(s): LIPASE, AMYLASE in the last 168 hours. No results for input(s): AMMONIA in the last 168 hours. Coagulation Profile: No results for input(s): INR, PROTIME in the last 168 hours. Cardiac Enzymes: No results for input(s): CKTOTAL, CKMB, CKMBINDEX, TROPONINI in the last 168 hours. BNP (last 3 results) No results for input(s): PROBNP in the last 8760 hours. HbA1C: Recent Labs    01/09/20 1700  HGBA1C 6.1*   CBG: Recent Labs  Lab 01/09/20 1637 01/09/20 2036 01/10/20 0250 01/10/20 0308 01/10/20 0627  GLUCAP 115* 71 41* 72 113*   Lipid Profile: Recent Labs    01/10/20 0519  CHOL  88  HDL 33*  LDLCALC 32  TRIG 113  CHOLHDL 2.7   Thyroid Function Tests: No results for input(s): TSH, T4TOTAL, FREET4, T3FREE, THYROIDAB in the last 72 hours. Anemia Panel: No results for input(s): VITAMINB12, FOLATE, FERRITIN, TIBC, IRON, RETICCTPCT in the last 72 hours. Sepsis Labs: No results for input(s): PROCALCITON, LATICACIDVEN in the last 168 hours.  Recent Results (from the past 240 hour(s))  SARS CORONAVIRUS 2 (TAT 6-24 HRS) Nasopharyngeal Nasopharyngeal Swab     Status: None   Collection Time: 01/09/20 11:35 AM   Specimen: Nasopharyngeal Swab  Result Value Ref Range Status   SARS Coronavirus 2 NEGATIVE NEGATIVE Final    Comment: (NOTE) SARS-CoV-2 target nucleic acids are NOT DETECTED. The SARS-CoV-2 RNA is generally detectable in upper and lower respiratory specimens during the acute phase of infection. Negative results do not preclude SARS-CoV-2 infection, do not rule out co-infections with other pathogens, and should not be used as the sole basis for treatment or other patient management decisions. Negative results must be  combined with clinical observations, patient history, and epidemiological information. The expected result is Negative. Fact Sheet for Patients: SugarRoll.be Fact Sheet for Healthcare Providers: https://www.woods-mathews.com/ This test is not yet approved or cleared by the [place] FDA and  has been authorized for detection and/or diagnosis of SARS-CoV-2 by FDA under an Emergency Use Authorization (EUA). This EUA will remain  in effect (meaning this test can be used) for the duration of the COVID-19 declaration under Section 56 4(b)(1) of the Act, 21 U.S.C. section 360bbb-3(b)(1), unless the authorization is terminated or revoked sooner. Performed at [facility], [place] [address] [place], [place] [address]       Radiology Studies: NM Pulmonary Perfusion  Result Date: 01/10/2020 CLINICAL DATA:  Chest pain. EXAM: NUCLEAR MEDICINE PERFUSION LUNG SCAN TECHNIQUE: Perfusion images were obtained in multiple projections after intravenous injection of radiopharmaceutical. Ventilation scans intentionally deferred if perfusion scan and chest x-ray adequate for interpretation during COVID 19 epidemic. RADIOPHARMACEUTICALS:  1.62 mCi Tc-37m MAA IV COMPARISON:  Chest x-ray 01/09/2020. FINDINGS: No perfusion defects noted.  No evidence of pulmonary embolus. IMPRESSION: Negative exam.  No evidence of pulmonary embolus. Electronically Signed   By: Marcello Moores  Register   On: 01/10/2020 10:55   DG Chest Port 1 View  Result Date: 01/09/2020 CLINICAL DATA:  Chest pain EXAM: PORTABLE CHEST 1 VIEW COMPARISON:  04/08/2014 FINDINGS: Post CABG changes with multiple fractured median sternotomy wires, as seen on previous study. Heart size is within normal limits. No focal airspace consolidation, pleural effusion, or pneumothorax. IMPRESSION: No acute cardiopulmonary findings. Electronically Signed   By: Davina Poke D.O.   On: 01/09/2020 08:59    Scheduled  Meds: . amLODipine  10 mg Oral Daily  . aspirin EC  81 mg Oral Daily  . carvedilol  6.25 mg Oral BID  . cholecalciferol  5,000 Units Oral Daily  . febuxostat  80 mg Oral Daily  . insulin aspart  0-15 Units Subcutaneous TID WC  . insulin aspart  0-5 Units Subcutaneous QHS  . insulin aspart  5 Units Subcutaneous TID WC  . [START ON 01/11/2020] insulin glargine  12 Units Subcutaneous Daily  . Melatonin  6 mg Sublingual QHS  . pantoprazole  80 mg Oral Daily  . pregabalin  75 mg Oral BID  . rosuvastatin  10 mg Oral Daily  . sodium chloride flush  3 mL Intravenous Q12H  . ticagrelor  90 mg Oral BID  . zolpidem  5 mg Oral QHS   Continuous Infusions: . sodium chloride    . sodium chloride    . sodium chloride 100 mL/hr at 01/10/20 0418     LOS: 0 days   Time spent: 32 minutes   Darliss Cheney, MD [facility]  01/10/2020, 11:18 AM   To contact the attending provider between 7A-7P or the covering provider during after hours 7P-7A, please log into the web site [email or web address].

## 2020-01-11 LAB — BASIC METABOLIC PANEL
Anion gap: 9 (ref 5–15)
BUN: 51 mg/dL — ABNORMAL HIGH (ref 8–23)
CO2: 22 mmol/L (ref 22–32)
Calcium: 9 mg/dL (ref 8.9–10.3)
Chloride: 108 mmol/L (ref 98–111)
Creatinine, Ser: 3.2 mg/dL — ABNORMAL HIGH (ref 0.61–1.24)
GFR calc Af Amer: 21 mL/min — ABNORMAL LOW (ref >60)
GFR calc non Af Amer: 18 mL/min — ABNORMAL LOW (ref >60)
Glucose, Bld: 83 mg/dL (ref 70–99)
Potassium: 4.8 mmol/L (ref 3.5–5.1)
Sodium: 139 mmol/L (ref 135–145)

## 2020-01-11 LAB — CBC
HCT: 33.2 % — ABNORMAL LOW (ref 39.0–52.0)
Hemoglobin: 11 g/dL — ABNORMAL LOW (ref 13.0–17.0)
MCH: 31.6 pg (ref 26.0–34.0)
MCHC: 33.1 g/dL (ref 30.0–36.0)
MCV: 95.4 fL (ref 80.0–100.0)
Platelets: 175 10*3/uL (ref 150–400)
RBC: 3.48 MIL/uL — ABNORMAL LOW (ref 4.22–5.81)
RDW: 12.5 % (ref 11.5–15.5)
WBC: 7.1 10*3/uL (ref 4.0–10.5)
nRBC: 0 % (ref 0.0–0.2)

## 2020-01-11 LAB — GLUCOSE, CAPILLARY
Glucose-Capillary: 87 mg/dL (ref 70–99)
Glucose-Capillary: 83 mg/dL (ref 70–99)

## 2020-01-11 NOTE — Discharge Instructions (Signed)
Acute Kidney Injury, Adult  Acute kidney injury is a sudden worsening of kidney function. The kidneys are organs that have several jobs. They filter the blood to remove waste products and extra fluid. They also maintain a healthy balance of minerals and hormones in the body, which helps control blood pressure and keep bones strong. With this condition, your kidneys do not do their jobs as well as they should. This condition ranges from mild to severe. Over time it may develop into long-lasting (chronic) kidney disease. Early detection and treatment may prevent acute kidney injury from developing into a chronic condition. What are the causes? Common causes of this condition include:  A problem with blood flow to the kidneys. This may be caused by: ? Low blood pressure (hypotension) or shock. ? Blood loss. ? Heart and blood vessel (cardiovascular) disease. ? Severe burns. ? Liver disease.  Direct damage to the kidneys. This may be caused by: ? Certain medicines. ? A kidney infection. ? Poisoning. ? Being around or in contact with toxic substances. ? A surgical wound. ? A hard, direct hit to the kidney area.  A sudden blockage of urine flow. This may be caused by: ? Cancer. ? Kidney stones. ? An enlarged prostate in males. What are the signs or symptoms? Symptoms of this condition may not be obvious until the condition becomes severe. Symptoms of this condition can include:  Tiredness (lethargy), or difficulty staying awake.  Nausea or vomiting.  Swelling (edema) of the face, legs, ankles, or feet.  Problems with urination, such as: ? Abdominal pain, or pain along the side of your stomach (flank). ? Decreased urine production. ? Decrease in the force of urine flow.  Muscle twitches and cramps, especially in the legs.  Confusion or trouble concentrating.  Loss of appetite.  Fever. How is this diagnosed? This condition may be diagnosed with tests, including:  Blood  tests.  Urine tests.  Imaging tests.  A test in which a sample of tissue is removed from the kidneys to be examined under a microscope (kidney biopsy). How is this treated? Treatment for this condition depends on the cause and how severe the condition is. In mild cases, treatment may not be needed. The kidneys may heal on their own. In more severe cases, treatment will involve:  Treating the cause of the kidney injury. This may involve changing any medicines you are taking or adjusting your dosage.  Fluids. You may need specialized IV fluids to balance your body's needs.  Having a catheter placed to drain urine and prevent blockages.  Preventing problems from occurring. This may mean avoiding certain medicines or procedures that can cause further injury to the kidneys. In some cases treatment may also require:  A procedure to remove toxic wastes from the body (dialysis or continuous renal replacement therapy - CRRT).  Surgery. This may be done to repair a torn kidney, or to remove the blockage from the urinary system. Follow these instructions at home: Medicines  Take over-the-counter and prescription medicines only as told by your health care provider.  Do not take any new medicines without your health care provider's approval. Many medicines can worsen your kidney damage.  Do not take any vitamin and mineral supplements without your health care provider's approval. Many nutritional supplements can worsen your kidney damage. Lifestyle  If your health care provider prescribed changes to your diet, follow them. You may need to decrease the amount of protein you eat.  Achieve and maintain a  healthy weight. If you need help with this, ask your health care provider.  Start or continue an exercise plan. Try to exercise at least 30 minutes a day, 5 days a week.  Do not use any tobacco products, such as cigarettes, chewing tobacco, and e-cigarettes. If you need help quitting, ask your  health care provider. General instructions  Keep track of your blood pressure. Report changes in your blood pressure as told by your health care provider.  Stay up to date with immunizations. Ask your health care provider which immunizations you need.  Keep all follow-up visits as told by your health care provider. This is important. Where to find more information  American Association of Kidney Patients: BombTimer.gl  National Kidney Foundation: www.kidney.[place]: https://mathis.com/  [facility]: ? www.lifeoptions.org ? www.kidneyschool.org Contact a health care provider if:  Your symptoms get worse.  You develop new symptoms. Get help right away if:  You develop symptoms of worsening kidney disease, which include: ? Headaches. ? Abnormally dark or light skin. ? Easy bruising. ? Frequent hiccups. ? Chest pain. ? Shortness of breath. ? End of menstruation in women. ? Seizures. ? Confusion or altered mental status. ? Abdominal or back pain. ? Itchiness.  You have a fever.  Your body is producing less urine.  You have pain or bleeding when you urinate. Summary  Acute kidney injury is a sudden worsening of kidney function.  Acute kidney injury can be caused by problems with blood flow to the kidneys, direct damage to the kidneys, and sudden blockage of urine flow.  Symptoms of this condition may not be obvious until it becomes severe. Symptoms may include edema, lethargy, confusion, nausea or vomiting, and problems passing urine.  This condition can usually be diagnosed with blood tests, urine tests, and imaging tests. Sometimes a kidney biopsy is done to diagnose this condition.  Treatment for this condition often involves treating the underlying cause. It is treated with fluids, medicines, dialysis, diet changes, or surgery. This information is not intended to replace advice given to you by your health care provider. Make  sure you discuss any questions you have with your health care provider. Document Revised: 11/17/2017 Document Reviewed: 11/25/2016 Elsevier Patient Education  [place]. Angina  Angina is very bad discomfort or pain in the chest, neck, arm, jaw, or back. The discomfort is caused by a lack of blood in the middle layer of the heart wall (myocardium). What are the causes? This condition is caused by a buildup of fat and cholesterol (plaque) in your arteries (atherosclerosis). This buildup narrows the arteries and makes it hard for blood to flow. What increases the risk? You are more likely to develop this condition if:  You have high levels of cholesterol in your blood.  You have high blood pressure (hypertension).  You have diabetes.  You have a family history of heart disease.  You are not active, or you do not exercise enough.  You feel sad (depressed).  You have been treated with high energy rays (radiation) on the left side of your chest. Other risk factors are:  Using tobacco.  Being very overweight (obese).  Eating a diet high in unhealthy fats (saturated fats).  Having stress, or being exposed to things that cause stress.  Using drugs, such as cocaine. Women have a greater risk for angina if:  They are older than 59.  They have stopped having their period (are in postmenopause). What are the  signs or symptoms? Common symptoms of this condition in both men and women may include:  Chest pain, which may: ? Feel like a crushing or squeezing in the chest. ? Feel like a tightness, pressure, fullness, or heaviness in the chest. ? Last for more than a few minutes at a time. ? Stop and come back (recur) after a few minutes.  Pain in the neck, arm, jaw, or back.  Heartburn or upset stomach (indigestion) for no reason.  Being short of breath.  Feeling sick to your stomach (nauseous).  Sudden cold sweats. Women and people with diabetes may have other  symptoms that are not usual, such as feeling:  Tired (fatigue).  Worried or nervous (anxious) for no reason.  Weak for no reason.  Dizzy or passing out (fainting). How is this treated? This condition may be treated with:  Medicines. These are given to: ? Prevent blood clots. ? Prevent heart attack. ? Relax blood vessels and improve blood flow to the heart (nitrates). ? Reduce blood pressure. ? Improve the pumping action of the heart. ? Reduce fat and cholesterol in the blood.  A procedure to widen a narrowed or blocked artery in the heart (angioplasty).  Surgery to allow blood to go around a blocked artery (coronary artery bypass surgery). Follow these instructions at home: Medicines  Take over-the-counter and prescription medicines only as told by your doctor.  Do not take these medicines unless your doctor says that you can: ? NSAIDs. These include:  Ibuprofen.  Naproxen. ? Vitamin supplements that have vitamin A, vitamin E, or both. ? Hormone therapy that contains estrogen with or without progestin. Eating and drinking   Eat a heart-healthy diet that includes: ? Lots of fresh fruits and vegetables. ? Whole grains. ? Low-fat (lean) protein. ? Low-fat dairy products.  Follow instructions from your doctor about what you cannot eat or drink. Activity  Follow an exercise program that your doctor tells you.  Talk with your doctor about joining a program to help improve the health of your heart (cardiac rehab).  When you feel tired, take a break. Plan breaks if you know you are going to feel tired. Lifestyle   Do not use any products that contain nicotine or tobacco. This includes cigarettes, e-cigarettes, and chewing tobacco. If you need help quitting, ask your doctor.  If your doctor says you can drink alcohol: ? Limit how much you use to:  0-1 drink a day for women who are not pregnant.  0-2 drinks a day for men. ? Be aware of how much alcohol is in your  drink. In the [place] one drink equals:  One 12 oz bottle of beer (355 mL).  One 5 oz glass of wine (148 mL).  One 1 oz glass of hard liquor (44 mL). General instructions  Stay at a healthy weight. If your doctor tells you to do so, work with him or her to lose weight.  Learn to deal with stress. If you need help, ask your doctor.  Keep your vaccines up to date. Get a flu shot every year.  Talk with your doctor if you feel sad. Take a screening test to see if you are at risk for depression.  Work with your doctor to manage any other health problems that you have. These may include diabetes or high blood pressure.  Keep all follow-up visits as told by your doctor. This is important. Get help right away if:  You have pain in your chest, neck,  arm, jaw, or back, and the pain: ? Lasts more than a few minutes. ? Comes back. ? Does not get better after you take medicine under your tongue (sublingual nitroglycerin). ? Keeps getting worse. ? Comes more often.  You have any of these problems for no reason: ? Sweating a lot. ? Heartburn or upset stomach. ? Shortness of breath. ? Trouble breathing. ? Feeling sick to your stomach. ? Throwing up (vomiting). ? Feeling more tired than normal. ? Feeling nervous or worrying more than normal. ? Weakness.  You are suddenly dizzy or light-headed.  You pass out. These symptoms may be an emergency. Do not wait to see if the symptoms will go away. Get medical help right away. Call your local emergency services (911 in the [place] Do not drive yourself to the hospital. Summary  Angina is very bad discomfort or pain in the chest, neck, arm, neck, or back.  Symptoms include chest pain, heartburn or upset stomach for no reason, and shortness of breath.  Women or people with diabetes may have symptoms that are not usual, such as feeling nervous or worried for no reason, weak for no reason, or tired.  Take all medicines only as told by your  doctor.  You should eat a heart-healthy diet and follow an exercise program. This information is not intended to replace advice given to you by your health care provider. Make sure you discuss any questions you have with your health care provider. Document Revised: 07/23/2018 Document Reviewed: 07/23/2018 Elsevier Patient Education  [place].

## 2020-01-11 NOTE — Plan of Care (Signed)

## 2020-01-11 NOTE — Progress Notes (Signed)
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 Feels good, no dyspnea . VQ neg for PE . SCR down to 3.2, UOP 0.9L  01/22 0701 - 01/23 0700 In: 1537.8 [P.O.:973; I.V.:489.8] Out: 900 [Urine:900]  Filed Weights   01/09/20 1823 01/10/20 0329 01/11/20 0539  Weight: 93.5 kg 92.9 kg 93 kg    Scheduled Meds: . amLODipine  10 mg Oral Daily  . aspirin EC  81 mg Oral Daily  . carvedilol  6.25 mg Oral BID  . cholecalciferol  5,000 Units Oral Daily  . enoxaparin (LOVENOX) injection  30 mg Subcutaneous Q24H  . febuxostat  80 mg Oral Daily  . insulin aspart  0-15 Units Subcutaneous TID WC  . insulin aspart  0-5 Units Subcutaneous QHS  . insulin aspart  5 Units Subcutaneous TID WC  . insulin glargine  12 Units Subcutaneous Daily  . Melatonin  6 mg Sublingual QHS  . pantoprazole  80 mg Oral Daily  . pregabalin  75 mg Oral BID  . rosuvastatin  10 mg Oral Daily  . sodium chloride flush  3 mL Intravenous Q12H  . ticagrelor  90 mg Oral BID  . zolpidem  5 mg Oral QHS   Continuous Infusions: . sodium chloride    . sodium chloride 50 mL/hr at 01/10/20 2236   PRN Meds:.sodium chloride, acetaminophen, albuterol, HYDROcodone-acetaminophen, nitroGLYCERIN, ondansetron (ZOFRAN) IV, sodium chloride flush  Current Labs: reviewed  11/14/19 SCR 1.9    Physical Exam:  Blood pressure 113/68, pulse (!) 59, temperature (!) 97.5 F (36.4 C), temperature source Oral, resp. rate 20, height 5\' 6"  (1.676 m), weight 93 kg, SpO2 96 %. NAD, lying flat in the bed, normal work of breathing Regular, normal S1 and S2 Bibasilar faint crackles, otherwise clear Trace lower extremity edema NCAT EOMI AAOx3  A 1. AoCKD3, BL SCr 1.9 10/2019; Sees [facility]el at [facility] 2.  [place], sig hx/o CAD/CABG and STEMI 10/2019; per CV; med management 3. HTN, holding ARB, BP stable 4. DM2  P . OK for DC . Resume outpt lasix at DC . Do not resume ARB . He  already has f/u with Dr Posey Pronto next week, keep that appt   Pearson Grippe MD 01/11/2020, 12:34 PM  Recent Labs  Lab 01/09/20 0831 01/10/20 0519 01/11/20 0609  NA 142 138 139  K 4.8 4.4 4.8  CL 108 105 108  CO2 22 22 22   GLUCOSE 88 129* 83  BUN 68* 64* 51*  CREATININE 3.58* 3.24* 3.20*  CALCIUM 9.0 8.4* 9.0   Recent Labs  Lab 01/09/20 0831 01/10/20 0519 01/11/20 0609  WBC 9.3 8.2 7.1  HGB 11.8* 10.8* 11.0*  HCT 36.5* 32.8* 33.2*  MCV 98.6 97.0 95.4  PLT 203 173 175

## 2020-01-11 NOTE — Discharge Summary (Signed)
Physician Discharge Summary  Allen Medina QQP:[id number] DOB: 05-May-1941 DOA: 01/09/2020  PCP: Marton Redwood, MD  Admit date: 01/09/2020 Discharge date: 01/11/2020  Admitted From: Home Disposition: Home  Recommendations for Outpatient Follow-up:  1. Follow up with PCP in 1-2 weeks 2. Close follow-up with nephrology next week.  Nephrology to arrange that. 3. Please obtain BMP/CBC in one week 4. Please follow up on the following pending results:  Home Health: None Equipment/Devices: None  Discharge Condition: Stable CODE STATUS: Full code Diet recommendation: Cardiac/renal  Subjective: Seen and examined.  Wife at the bedside.  No chest pain shortness of breath or any other complaint.  Brief/Interim Summary: Allen Medina Collinsis a 79 y.o.malewith medical history significant ofhypertension, hyperlipidemia, chronic kidney disease stage IV, diabetes type 2, coronary artery disease status post CABG x4 with a myocardial infarction in November 2020 (treated in [facility]) who presented the emergency department complaining of sudden onset of intermittent substernal chest pain lasting about a week. He has had constant shortness of breath associated with it for the past week worsened with exertion. Had 3 pound weight gain in the past weeks feels a bit tighter in his feet but his hands feel about the same note tense skin. Chest pain has become more more constant in nature over the past several days. He woke up today with chest pain. A recent myocardial infarction in November he received a stent and recently took himself off his Brilinta about a week ago because he felt that his stomach was hurting because of it.  He was admitted under hospital service due to AKI on CKD stage III since his creatinine was 3.5 at this time which was all the way up from his baseline of 2.2.  He was also admitted for unstable angina.  Cardiology and nephrology were consulted.  Due to his rising creatinine, cardiac  catheterization was not an option.  Cardiology discussed this with the patient and patient shows to go with medical management at this point in time and defer further cardiac catheterization.  His Benicar and Lasix were held.  He was given IV fluids for brief period of time.  His kidney function improved little bit and has been trending down although it is still up than his baseline and he has good urine output.  Cardiology has cleared him to go home with medical management.  They also recommended that he should resume his Brilinta along with aspirin.  Nephrology has also cleared him and he is feeling better with no symptoms so he is going to be discharged in a stable condition and will follow up with cardiology next week and nephrology will also follow up with him closely next week.  Per nephrology recommendations, his Lasix has been resumed however his Benicar is being stopped.   Discharge Diagnoses:  Principal Problem:   Unstable angina (HCC) Active Problems:   Diabetes ([place])   CKD (chronic kidney disease) stage 4, GFR 15-29 ml/min (HCC)   CAD, multiple vessel; native LAD and circumflex 100% occluded; distal RCA severely diseased with possible PL or RPDA occluded   Peripheral vascular disease (HCC)   HTN (hypertension)   Obesity (BMI 30.0-34.9)   Esophageal dysmotility   Acute renal failure [facility])    Discharge Instructions  Discharge Instructions    Discharge patient   Complete by: As directed    Discharge disposition: 01-Home or Self Care   Discharge patient date: 01/11/2020     Allergies as of 01/11/2020   No Known Allergies  Medication List    STOP taking these medications   clopidogrel 75 MG tablet Commonly known as: PLAVIX   olmesartan 40 MG tablet Commonly known as: BENICAR     TAKE these medications   amLODipine 10 MG tablet Commonly known as: NORVASC Take 10 mg by mouth daily.   aspirin EC 81 MG tablet Take 81 mg by mouth daily.   carvedilol 6.25 MG  tablet Commonly known as: COREG Take 6.25 mg by mouth 2 (two) times daily.   cyanocobalamin 1000 MCG/ML injection Commonly known as: (VITAMIN B-12) Inject 1,000 mcg into the muscle every 30 (thirty) days.   furosemide 40 MG tablet Commonly known as: LASIX Take 40 mg by mouth daily.   HumaLOG KwikPen 100 UNIT/ML KiwkPen Generic drug: insulin lispro Inject 12 Units into the skin 2 (two) times daily.   HYDROcodone-acetaminophen 5-325 MG tablet Commonly known as: NORCO/VICODIN Take 1 tablet by mouth every 6 (six) hours as needed.   Melatonin 5 MG Subl Place 5 mg under the tongue at bedtime.   nitroGLYCERIN 0.4 MG SL tablet Commonly known as: NITROSTAT DISSOLVE 1 TABLET UNDER TONGUE EVERY 5 MINUTES UP TO 15 MIN FOR CHEST PAIN. IF NO RELIEF CALL 911. What changed: See the new instructions.   NyQuil HBP Cold & Flu 15-6.25-325 MG/15ML Liqd Generic drug: DM-Doxylamine-Acetaminophen Take 15 mLs by mouth every 6 (six) hours as needed (cold symptoms).   omeprazole 40 MG capsule Commonly known as: PRILOSEC Take 40 mg by mouth daily.   pregabalin 75 MG capsule Commonly known as: LYRICA Take 75 mg by mouth 3 (three) times daily.   ProAir HFA 108 (90 Base) MCG/ACT inhaler Generic drug: albuterol Inhale 2 puffs into the lungs every 6 (six) hours as needed for wheezing or shortness of breath.   rosuvastatin 20 MG tablet Commonly known as: CRESTOR Take 1 tablet (20 mg total) by mouth daily.   tadalafil 5 MG tablet Commonly known as: CIALIS Take 5 mg by mouth at bedtime.   ticagrelor 90 MG Tabs tablet Commonly known as: Brilinta Take 1 tablet (90 mg total) by mouth 2 (two) times daily.   Toujeo SoloStar 300 UNIT/ML Sopn Generic drug: Insulin Glargine Inject 24-26 Units into the skin daily.   Trulicity 1.5 JX/9.1YN Sopn Generic drug: Dulaglutide Inject 1.5 mg into the skin every Saturday.   Uloric 80 MG Tabs Generic drug: Febuxostat Take 80 mg by mouth daily.   Vitamin  D3 125 MCG (5000 UT) Caps Take 5,000 Units by mouth daily.   zolpidem 10 MG tablet Commonly known as: AMBIEN Take 10 mg by mouth at bedtime.      Follow-up Information    Marton Redwood, MD Follow up in 1 week(s).   Specialty: Internal Medicine Contact information: [address] [place] [address] [phone]          No Known Allergies  Consultations: Nephrology and cardiology   Procedures/Studies: NM Pulmonary Perfusion  Result Date: 01/10/2020 CLINICAL DATA:  Chest pain. EXAM: NUCLEAR MEDICINE PERFUSION LUNG SCAN TECHNIQUE: Perfusion images were obtained in multiple projections after intravenous injection of radiopharmaceutical. Ventilation scans intentionally deferred if perfusion scan and chest x-ray adequate for interpretation during COVID 19 epidemic. RADIOPHARMACEUTICALS:  1.62 mCi Tc-25m MAA IV COMPARISON:  Chest x-ray 01/09/2020. FINDINGS: No perfusion defects noted.  No evidence of pulmonary embolus. IMPRESSION: Negative exam.  No evidence of pulmonary embolus. Electronically Signed   By: Marcello Moores  Register   On: 01/10/2020 10:55   DG Chest Port 1 [address]  Result Date: 01/09/2020 CLINICAL DATA:  Chest pain EXAM: PORTABLE CHEST 1 VIEW COMPARISON:  04/08/2014 FINDINGS: Post CABG changes with multiple fractured median sternotomy wires, as seen on previous study. Heart size is within normal limits. No focal airspace consolidation, pleural effusion, or pneumothorax. IMPRESSION: No acute cardiopulmonary findings. Electronically Signed   By: Davina Poke D.O.   On: 01/09/2020 08:59   ECHOCARDIOGRAM COMPLETE  Result Date: 01/10/2020   ECHOCARDIOGRAM REPORT   Patient Name:   Allen Medina Date of Exam: 01/10/2020 Medical Rec #:  [id number]        Height:       66.0 in Accession #:    [id number]       Weight:       204.9 lb Date of Birth:  01/16/1941        BSA:          2.02 m Patient Age:    62 years         BP:           116/57 mmHg Patient Gender: M                HR:            62 bpm. Exam Location:  Inpatient Procedure: 2D Echo, Cardiac Doppler and Color Doppler Indications:     R07.9* Chest pain, unspecified  History:         Patient has prior history of Echocardiogram examinations, most                  recent 07/09/2013. CAD and Previous Myocardial Infarction, Prior                  CABG; Risk Factors:Hypertension, Diabetes and Dyslipidemia.                  CKD.  Sonographer:     Jonelle Sidle Dance Referring Phys:  [id number] Lady Deutscher Diagnosing Phys: Adrian Prows MD IMPRESSIONS  1. The left ventricle has normal function. There is no left ventricular hypertrophy. Mild hypokinesis of the left ventricular, entire inferolateral wall. Elevated left atrial and left ventricular end-diastolic pressures. Left ventricular diastolic parameters are consistent with Grade II diastolic dysfunction (pseudonormalization). Left ventricular ejection fraction by PLAX is is 54 %. Left ventricular ejection fraction, by visual estimation, is 50 to 55%.  2. Left atrial size was moderately dilated.  3. The mitral valve is normal in structure. Mild mitral valve regurgitation.  4. The aortic valve is tricuspid. Aortic valve regurgitation is not visualized. Mild aortic valve sclerosis without stenosis. FINDINGS  Left Ventricle: Left ventricular ejection fraction, by visual estimation, is 50 to 55%. Left ventricular ejection fraction by PLAX is is 54 % The left ventricle has normal function. Mild hypokinesis of the left ventricular, entire inferolateral wall. The left ventricular internal cavity size was the LV cavity size is small. There is no left ventricular hypertrophy. Left ventricular diastolic parameters are consistent with Grade II diastolic dysfunction (pseudonormalization). Elevated left atrial and left ventricular end-diastolic pressures. Right Ventricle: The right ventricular size is normal. No increase in right ventricular wall thickness. Global RV systolic function is has normal systolic  function. Left Atrium: Left atrial size was moderately dilated. Right Atrium: Right atrial size was normal in size Pericardium: There is no evidence of pericardial effusion. Mitral Valve: The mitral valve is normal in structure. Mild mitral valve regurgitation. Tricuspid Valve: The tricuspid valve is normal in structure. Tricuspid valve regurgitation is  trivial. Aortic Valve: The aortic valve is tricuspid. Aortic valve regurgitation is not visualized. Mild aortic valve sclerosis is present, with no evidence of aortic valve stenosis. Pulmonic Valve: The pulmonic valve was grossly normal. Pulmonic valve regurgitation is trivial. Pulmonic regurgitation is trivial. Aorta: The aortic root is normal in size and structure. Venous: The inferior vena cava is normal in size with greater than 50% respiratory variability, suggesting right atrial pressure of 3 mmHg. IAS/Shunts: No atrial level shunt detected by color flow Doppler.  LEFT VENTRICLE PLAX 2D LV EF:         Left            Diastology                ventricular     LV e' lateral:   6.85 cm/s                ejection        LV E/e' lateral: 20.7                fraction by     LV e' medial:    5.00 cm/s                PLAX is is      LV E/e' medial:  28.4                54 % LVIDd:         4.79 cm LVIDs:         3.44 cm LV PW:         1.16 cm LV IVS:        1.30 cm LVOT diam:     1.80 cm LV SV:         58 ml LV SV Index:   27.53 LVOT Area:     2.54 cm  RIGHT VENTRICLE RV Basal diam:  2.90 cm RV S prime:     10.80 cm/s TAPSE (M-mode): 2.6 cm LEFT ATRIUM             Index       RIGHT ATRIUM           Index LA diam:        4.60 cm 2.28 cm/m  RA Area:     10.60 cm LA Vol (A2C):   52.8 ml 26.13 ml/m RA Volume:   23.00 ml  11.38 ml/m LA Vol (A4C):   73.9 ml 36.58 ml/m LA Biplane Vol: 65.2 ml 32.27 ml/m  AORTIC VALVE LVOT Vmax:   96.05 cm/s LVOT Vmean:  60.200 cm/s LVOT VTI:    0.222 m  AORTA Ao Root diam: 3.20 cm Ao Asc diam:  3.50 cm MITRAL VALVE MV Area (PHT): 3.60  cm              SHUNTS MV PHT:        61.19 msec            Systemic VTI:  0.22 m MV Decel Time: 211 msec              Systemic Diam: 1.80 cm MV E velocity: 142.00 cm/s 103 cm/s MV A velocity: 96.10 cm/s  70.3 cm/s MV E/A ratio:  1.48        1.5  Adrian Prows MD Electronically signed by Adrian Prows MD Signature Date/Time: 01/10/2020/4:56:06 PM    Final       Discharge Exam: Vitals:   01/11/20 7517 01/11/20 0017  BP: 123/67 112/73  Pulse: 66 64  Resp: 18   Temp: 98 F (36.7 C)   SpO2: 96%    Vitals:   01/10/20 1322 01/10/20 2231 01/11/20 0539 01/11/20 0943  BP: 111/63 122/65 123/67 112/73  Pulse: (!) 58 62 66 64  Resp: 18 17 18    Temp: (!) 97.4 F (36.3 C) 97.6 F (36.4 C) 98 F (36.7 C)   TempSrc: Oral Oral Oral   SpO2: 97% 94% 96%   Weight:   93 kg   Height:        General: Pt is alert, awake, not in acute distress Cardiovascular: RRR, S1/S2 +, no rubs, no gallops Respiratory: CTA bilaterally, no wheezing, no rhonchi Abdominal: Soft, NT, ND, bowel sounds + Extremities: Trace pitting edema bilateral lower extremity, no cyanosis    The results of significant diagnostics from this hospitalization (including imaging, microbiology, ancillary and laboratory) are listed below for reference.     Microbiology: Recent Results (from the past 240 hour(s))  SARS CORONAVIRUS 2 (TAT 6-24 HRS) Nasopharyngeal Nasopharyngeal Swab     Status: None   Collection Time: 01/09/20 11:35 AM   Specimen: Nasopharyngeal Swab  Result Value Ref Range Status   SARS Coronavirus 2 NEGATIVE NEGATIVE Final    Comment: (NOTE) SARS-CoV-2 target nucleic acids are NOT DETECTED. The SARS-CoV-2 RNA is generally detectable in upper and lower respiratory specimens during the acute phase of infection. Negative results do not preclude SARS-CoV-2 infection, do not rule out co-infections with other pathogens, and should not be used as the sole basis for treatment or other patient management decisions. Negative  results must be combined with clinical observations, patient history, and epidemiological information. The expected result is Negative. Fact Sheet for Patients: SugarRoll.be Fact Sheet for Healthcare Providers: https://www.woods-mathews.com/ This test is not yet approved or cleared by the [place] FDA and  has been authorized for detection and/or diagnosis of SARS-CoV-2 by FDA under an Emergency Use Authorization (EUA). This EUA will remain  in effect (meaning this test can be used) for the duration of the COVID-19 declaration under Section 56 4(b)(1) of the Act, 21 U.S.C. section 360bbb-3(b)(1), unless the authorization is terminated or revoked sooner. Performed at [facility], [place] [address], [place], [place] [address]      Labs: BNP (last 3 results) Recent Labs    01/09/20 0847  BNP 235.3*   Basic Metabolic Panel: Recent Labs  Lab 01/09/20 0831 01/10/20 0519 01/11/20 0609  NA 142 138 139  K 4.8 4.4 4.8  CL 108 105 108  CO2 22 22 22   GLUCOSE 88 129* 83  BUN 68* 64* 51*  CREATININE 3.58* 3.24* 3.20*  CALCIUM 9.0 8.4* 9.0  MG  --  1.7  --    Liver Function Tests: No results for input(s): AST, ALT, ALKPHOS, BILITOT, PROT, ALBUMIN in the last 168 hours. No results for input(s): LIPASE, AMYLASE in the last 168 hours. No results for input(s): AMMONIA in the last 168 hours. CBC: Recent Labs  Lab 01/09/20 0831 01/10/20 0519 01/11/20 0609  WBC 9.3 8.2 7.1  HGB 11.8* 10.8* 11.0*  HCT 36.5* 32.8* 33.2*  MCV 98.6 97.0 95.4  PLT 203 173 175   Cardiac Enzymes: No results for input(s): CKTOTAL, CKMB, CKMBINDEX, TROPONINI in the last 168 hours. BNP: Invalid input(s): POCBNP CBG: Recent Labs  Lab 01/10/20 0627 01/10/20 1319 01/10/20 1628 01/10/20 2130 01/11/20 0618  GLUCAP 113* 74 140* 135* 87   D-Dimer Recent Labs  01/09/20 0847  DDIMER 1.41*   Hgb A1c Recent Labs    01/09/20 1700  HGBA1C 6.1*    Lipid Profile Recent Labs    01/10/20 0519  CHOL 88  HDL 33*  LDLCALC 32  TRIG 113  CHOLHDL 2.7   Thyroid function studies No results for input(s): TSH, T4TOTAL, T3FREE, THYROIDAB in the last 72 hours.  Invalid input(s): FREET3 Anemia work up No results for input(s): VITAMINB12, FOLATE, FERRITIN, TIBC, IRON, RETICCTPCT in the last 72 hours. Urinalysis    Component Value Date/Time   COLORURINE YELLOW 04/17/2014 1941   APPEARANCEUR CLEAR 04/17/2014 1941   LABSPEC 1.016 04/17/2014 1941   PHURINE 5.5 04/17/2014 1941   GLUCOSEU NEGATIVE 04/17/2014 1941   HGBUR NEGATIVE 04/17/2014 1941   BILIRUBINUR NEGATIVE 04/17/2014 1941   KETONESUR NEGATIVE 04/17/2014 1941   PROTEINUR NEGATIVE 04/17/2014 1941   UROBILINOGEN 0.2 04/17/2014 1941   NITRITE NEGATIVE 04/17/2014 1941   LEUKOCYTESUR NEGATIVE 04/17/2014 1941   Sepsis Labs Invalid input(s): PROCALCITONIN,  WBC,  LACTICIDVEN Microbiology Recent Results (from the past 240 hour(s))  SARS CORONAVIRUS 2 (TAT 6-24 HRS) Nasopharyngeal Nasopharyngeal Swab     Status: None   Collection Time: 01/09/20 11:35 AM   Specimen: Nasopharyngeal Swab  Result Value Ref Range Status   SARS Coronavirus 2 NEGATIVE NEGATIVE Final    Comment: (NOTE) SARS-CoV-2 target nucleic acids are NOT DETECTED. The SARS-CoV-2 RNA is generally detectable in upper and lower respiratory specimens during the acute phase of infection. Negative results do not preclude SARS-CoV-2 infection, do not rule out co-infections with other pathogens, and should not be used as the sole basis for treatment or other patient management decisions. Negative results must be combined with clinical observations, patient history, and epidemiological information. The expected result is Negative. Fact Sheet for Patients: SugarRoll.be Fact Sheet for Healthcare Providers: https://www.woods-mathews.com/ This test is not yet approved or cleared by  the [place] FDA and  has been authorized for detection and/or diagnosis of SARS-CoV-2 by FDA under an Emergency Use Authorization (EUA). This EUA will remain  in effect (meaning this test can be used) for the duration of the COVID-19 declaration under Section 56 4(b)(1) of the Act, 21 U.S.C. section 360bbb-3(b)(1), unless the authorization is terminated or revoked sooner. Performed at [facility], [place] [address] [place], [place] [address]      Time coordinating discharge: Over 30 minutes  SIGNED:   Darliss Cheney, MD  [facility] 01/11/2020, 11:30 AM  If 7PM-7AM, please contact night-coverage [email or web address] Password [email or web address]

## 2020-01-14 ENCOUNTER — Telehealth: Payer: Self-pay

## 2020-01-14 NOTE — Telephone Encounter (Signed)
Location of hospitalization: [facility] Reason for hospitalization: Acute renal failure Date of discharge: 01/11/2020 Date of first communication with patient: today Person contacting patient: Marga Gramajo RMA Current symptoms: None, pt says that he feels fine  Do you understand why you were in the Hospital: Yes Questions regarding discharge instructions: None Where were you discharged to: Home Medications reviewed: Yes Allergies reviewed: Yes Dietary changes reviewed: Yes. Discussed low fat and low salt diet.  Referals reviewed: NA Activities of Daily Living: Able to with mild limitations Any transportation issues/concerns: None Any patient concerns: None Confirmed importance & date/time of Follow up appt: Yes Confirmed with patient if condition begins to worsen call. Pt was given the office number and encouraged to call back with questions or concerns: Yes

## 2020-01-14 NOTE — Telephone Encounter (Signed)
Awaiting call back to do TOC

## 2020-01-17 ENCOUNTER — Other Ambulatory Visit: Payer: Self-pay | Admitting: Cardiology

## 2020-01-20 ENCOUNTER — Encounter: Payer: Self-pay | Admitting: Cardiology

## 2020-01-20 ENCOUNTER — Ambulatory Visit: Payer: Medicare PPO | Admitting: Cardiology

## 2020-01-20 VITALS — BP 134/68 | HR 62 | Temp 97.7°F | Ht 66.0 in | Wt 205.1 lb

## 2020-01-20 DIAGNOSIS — I129 Hypertensive chronic kidney disease with stage 1 through stage 4 chronic kidney disease, or unspecified chronic kidney disease: Secondary | ICD-10-CM

## 2020-01-20 DIAGNOSIS — I25118 Atherosclerotic heart disease of native coronary artery with other forms of angina pectoris: Secondary | ICD-10-CM

## 2020-01-20 DIAGNOSIS — N184 Chronic kidney disease, stage 4 (severe): Secondary | ICD-10-CM | POA: Diagnosis not present

## 2020-01-20 DIAGNOSIS — I10 Essential (primary) hypertension: Secondary | ICD-10-CM

## 2020-01-20 DIAGNOSIS — I25708 Atherosclerosis of coronary artery bypass graft(s), unspecified, with other forms of angina pectoris: Secondary | ICD-10-CM

## 2020-01-20 NOTE — Progress Notes (Signed)
Primary Physician/Referring:  Marton Redwood, MD  Patient ID: Allen Medina, male    DOB: 1941/10/09, 79 y.o.   MRN: [id number]  Subjective   Chief Complaint  Patient presents with  . Hospitalization Follow-up  . Chest Pain  . Congestive Heart Failure   HPI: Allen Medina  is a 79 y.o. Caucasian male   with type 2 diabetes, stage IV CKD being followed by Dr. Elmarie Shiley, diabetic peripheral neuropathy, diabetic retinopathy, hypertension, hyperlipidemia, chronic back pain and digital joint disease and peripheral neuropathy, CAD and S/P CABG 1991 followed by redo CABG in 2003 due to occluded bypass grafts except LIMA to LAD,  MI in 2006 needing stent to SVG to RCA. Again admitted with acute inferior and lateral myocardial infarction on 11/13/2019, and had stenting to SVG to OM 2 and also to native distal OM 2 with implantation of 2 overlapping DES in [place].    Patient admitted to the hospital on 01/09/2020 and discharged 2 days later with acute diastolic heart failure, unstable angina pectoris, recommended medical therapy in view of acute on chronic stage IV kidney disease.  He now presents here for follow-up.  He is presently doing well, states that he has gone back to doing all his activities of daily living and his routine activities without any limitations.  He has not used any sublingual nitroglycerin, no dyspnea, no leg edema.  Past Medical History:  Diagnosis Date  . Arthritis    "hands" (04/16/2014)  . Blindness of left eye   . CAD (coronary artery disease) of bypass graft 2000   Occluded grafts --> redo CABG  . CAD, multiple vessel 1991   last cath 2006-patent grafts, last nuc 09/10/09-no ischemia  . Chronic lower back pain   . CKD (chronic kidney disease) stage 4, GFR 15-29 ml/min (HCC)    kidney stones,cmet 02/09/12, 02/13/12 on chart  . Claudication ([place])    LE dopplers 03/17/10, normal ABIs, normal pressures  . Diabetes mellitus type 2 with complications (HCC)      renal insuff, HTN, CAD, PVD  . History of blood transfusion 2010   "after back OR" (04/16/2014)  . HTN (hypertension)    difficult to control  . Hx of myocardial infarction   . Hyperlipidemia LDL goal < 100   . Kidney stones   . Myocardial infarction ([place]) 2000   LOV with EKG Dr Rex Kras 7/12 on chart, eccho 10/03, stress test 9/10 on chart, chest x ray 2/13 EPIC, chest CT 02/13/12 on chart  . Neuropathy in diabetes ([place])   . Peripheral vascular disease ([place])    peripheral neuropathy, claudication  . Pneumonia    hx of  . S/P CABG x 4 2000   SVG-OM1, SVG-OM 2-OM 3, SVG-diagonal, free RIMA-RPDA ( has stent in the vessel from 2003)-  . S/P CABG x 5    only LIMA-LAD still patent  . Spinal stenosis   . Thin skin   . Vitamin B 12 deficiency     Past Surgical History:  Procedure Laterality Date  . ANTERIOR LAT LUMBAR FUSION N/A 04/16/2014   Procedure: Lateral  Lumbar Two-Three/Three-Four Interbody and Fusion with Lateral Pedicle Screw Fixation Lumbar Two-Four;  Surgeon: Melina Schools, MD;  Location: [facility];  Service: Orthopedics;  Laterality: N/A;  . BACK SURGERY  2009, 2010  . CARDIAC CATHETERIZATION  06/04/1990   severe 3 vessel dz  . CARDIAC CATHETERIZATION  01/13/97   patent grafts, nl LV function  . CARDIAC  CATHETERIZATION  01/28/1999   severe native CAD with disease involving all 3 SVG, recommend redo CABG  . CARDIAC CATHETERIZATION  05/16/2002   patent graft except of mod to sever stenosis in the midprotion of the RIMA to the posterior descending artery  . CARDIAC CATHETERIZATION  08/28/2003   nl lv fxn, patent grafts to all vessels with small vessel disease  . CARDIAC CATHETERIZATION  07/07/2005   patent grafts, EF 45-50%  . CARPAL TUNNEL RELEASE Right   . CATARACT EXTRACTION W/ INTRAOCULAR LENS  IMPLANT, BILATERAL    . CHOLECYSTECTOMY    . COLONOSCOPY N/A 03/31/2017   Procedure: COLONOSCOPY;  Surgeon: Carol Ada, MD;  Location: [facility];  Service: Endoscopy;   Laterality: N/A;  . CORONARY ANGIOPLASTY WITH STENT PLACEMENT  05/22/2002   3.0x69m cypher stent covered the proximal and distal portion of RIMA, final inflation -16x60  . [place]& 2000   only LIMA-LAD patent  . CYSTOSCOPY W/ STONE MANIPULATION  2013  . CYSTOSCOPY/RETROGRADE/URETEROSCOPY/STONE EXTRACTION WITH BASKET  02/16/2012   Procedure: CYSTOSCOPY/RETROGRADE/URETEROSCOPY/STONE EXTRACTION WITH BASKET;  Surgeon: JMalka So MD;  Location: [facility];  Service: Urology;  Laterality: Left;  Left Ureterscopy with Stone Extraction  . DOPPLER ECHOCARDIOGRAPHY  07/09/2013   EF 65-78 (up from 50-55% in 2013), minimally increased transvalvular velocity across the aortic valve suggestive of very mild stenosis to simply sclerosis. Normal diastolic indices  . DOPPLER ECHOCARDIOGRAPHY    . LATERAL FUSION LUMBAR SPINE  04/16/2014  . NM MYOVIEW LTD  02/2013   no evidence of ischemia or infarct, septal hypokinesis/dyskinesis   . REDO - CORONARY ARTERY BYPASS GRAFT  2000    previous LIMA to LAD patent; seqSVG - OM 2, OM 3, SVG-D3, SVG-OM1, fRIMA-rPDA  . TONSILLECTOMY      Social History   Socioeconomic History  . Marital status: Married    Spouse name: Not on file  . Number of children: 3  . Years of education: Not on file  . Highest education level: Not on file  Occupational History  . Not on file  Tobacco Use  . Smoking status: Never Smoker  . Smokeless tobacco: Never Used  Substance and Sexual Activity  . Alcohol use: No  . Drug use: No  . Sexual activity: Not Currently  Other Topics Concern  . Not on file  Social History Narrative   He is a married father of three, grandfather of nine, great-grandfather of four. He usually walks   daily about 20 minutes a day - but not recently due to back pain. Does not currently smoke and does not drink.    Social Determinants of Health   Financial Resource Strain:   . Difficulty of Paying Living Expenses: Not on file    Food Insecurity:   . Worried About RCharity fundraiserin the Last Year: Not on file  . Ran Out of Food in the Last Year: Not on file  Transportation Needs:   . Lack of Transportation (Medical): Not on file  . Lack of Transportation (Non-Medical): Not on file  Physical Activity:   . Days of Exercise per Week: Not on file  . Minutes of Exercise per Session: Not on file  Stress:   . Feeling of Stress : Not on file  Social Connections:   . Frequency of Communication with Friends and Family: Not on file  . Frequency of Social Gatherings with Friends and Family: Not on file  . Attends Religious Services:  Not on file  . Active Member of Clubs or Organizations: Not on file  . Attends Archivist Meetings: Not on file  . Marital Status: Not on file  Intimate Partner Violence:   . Fear of Current or Ex-Partner: Not on file  . Emotionally Abused: Not on file  . Physically Abused: Not on file  . Sexually Abused: Not on file   Review of Systems  Constitution: Negative for chills, decreased appetite, malaise/fatigue and weight gain.  Cardiovascular: Positive for claudication (from back pain). Negative for chest pain, dyspnea on exertion, leg swelling and syncope.  Endocrine: Negative for cold intolerance.  Hematologic/Lymphatic: Does not bruise/bleed easily.  Musculoskeletal: Positive for back pain. Negative for joint swelling.  Gastrointestinal: Negative for abdominal pain, anorexia, change in bowel habit, hematochezia and melena.  Neurological: Negative for headaches and light-headedness.  Psychiatric/Behavioral: Negative for depression and substance abuse.  All other systems reviewed and are negative.  Objective  Temperature 97.7 F (36.5 C), height 5' 6"  (1.676 m), weight 205 lb 1.6 oz (93 kg). Body mass index is 33.1 kg/m.  Physical Exam  Constitutional: He appears well-developed. No distress.  Mildly obese  HENT:  Head: Atraumatic.  Eyes: Conjunctivae are normal.   Neck: No JVD present. No thyromegaly present.  Cardiovascular: Normal rate, regular rhythm, normal heart sounds and intact distal pulses. Exam reveals no gallop.  No murmur heard. No edema.   Pulmonary/Chest: Effort normal and breath sounds normal.  Abdominal: Soft. Bowel sounds are normal.  Musculoskeletal:        General: Normal range of motion.     Cervical back: Neck supple.  Neurological: He is alert.  Skin: Skin is warm and dry.  Psychiatric: He has a normal mood and affect.   Radiology: No results found.  Laboratory examination:    CMP Latest Ref Rng & Units 01/11/2020 01/10/2020 01/09/2020  Glucose 70 - 99 mg/dL 83 129(H) 88  BUN 8 - 23 mg/dL 51(H) 64(H) 68(H)  Creatinine 0.61 - 1.24 mg/dL 3.20(H) 3.24(H) 3.58(H)  Sodium 135 - 145 mmol/L 139 138 142  Potassium 3.5 - 5.1 mmol/L 4.8 4.4 4.8  Chloride 98 - 111 mmol/L 108 105 108  CO2 22 - 32 mmol/L 22 22 22   Calcium 8.9 - 10.3 mg/dL 9.0 8.4(L) 9.0  Total Protein 6.0 - 8.3 g/dL - - -  Total Bilirubin 0.3 - 1.2 mg/dL - - -  Alkaline Phos 39 - 117 U/L - - -  AST 0 - 37 U/L - - -  ALT 0 - 53 U/L - - -   CBC Latest Ref Rng & Units 01/11/2020 01/10/2020 01/09/2020  WBC 4.0 - 10.5 K/uL 7.1 8.2 9.3  Hemoglobin 13.0 - 17.0 g/dL 11.0(L) 10.8(L) 11.8(L)  Hematocrit 39.0 - 52.0 % 33.2(L) 32.8(L) 36.5(L)  Platelets 150 - 400 K/uL 175 173 203   Lipid Panel     Component Value Date/Time   CHOL 88 01/10/2020 0519   TRIG 113 01/10/2020 0519   HDL 33 (L) 01/10/2020 0519   CHOLHDL 2.7 01/10/2020 0519   VLDL 23 01/10/2020 0519   LDLCALC 32 01/10/2020 0519   HEMOGLOBIN A1C Lab Results  Component Value Date   HGBA1C 6.1 (H) 01/09/2020   MPG 128.37 01/09/2020   TSH No results for input(s): TSH in the last 8760 hours.   Labs 01/15/2020: Serum glucose 158 mg, BUN 65, creatinine 3.4, EGFR 17.7 mL.  Potassium 4.9.  Hb 12.5/HCT 38.6, platelets 205.  Normal indicis.  Labs  04/16/2019: Vitamin B12 2000.  Serum glucose 1 1 6  mg, BUN 40,  creatinine 2.6, EGFR 24 mL, potassium 4.6, CMP otherwise normal.  Hb 11.8/HCT 35.8, platelets 180.  Total cholesterol 103, triglycerides 114, HDL 36, LDL 44.  Non-HDL cholesterol 67.  TSH normal.  Medications   Current Outpatient Medications  Medication Instructions  . albuterol (PROAIR HFA) 108 (90 Base) MCG/ACT inhaler 2 puffs, Inhalation, Every 6 hours PRN  . amLODipine (NORVASC) 10 MG tablet TAKE (1) TABLET BY MOUTH ONCE DAILY.  Marland Kitchen aspirin EC 81 mg, Oral, Daily  . carvedilol (COREG) 6.25 mg, Oral, 2 times daily  . cyanocobalamin ((VITAMIN B-12)) 1,000 mcg, Intramuscular, Every 30 days  . DM-Doxylamine-Acetaminophen (NYQUIL HBP COLD & FLU) 15-6.25-325 MG/15ML LIQD 15 mLs, Oral, Every 6 hours PRN  . Dulaglutide (TRULICITY) 1.5 mg, Subcutaneous, Every Sat  . Febuxostat (ULORIC) 80 mg, Oral, Daily  . furosemide (LASIX) 40 mg, Oral, Daily  . HYDROcodone-acetaminophen (NORCO/VICODIN) 5-325 MG tablet 1 tablet, Oral, Every 6 hours PRN  . Insulin Glargine (TOUJEO SOLOSTAR) 24-26 Units, Subcutaneous, Daily  . insulin lispro (HUMALOG KWIKPEN) 12 Units, Subcutaneous, 2 times daily  . Melatonin 5 mg, Sublingual, Daily at bedtime  . nitroGLYCERIN (NITROSTAT) 0.4 MG SL tablet DISSOLVE 1 TABLET UNDER TONGUE EVERY 5 MINUTES UP TO 15 MIN FOR CHEST PAIN. IF NO RELIEF CALL 911.  . omeprazole (PRILOSEC) 40 mg, Oral, Daily  . pregabalin (LYRICA) 75 mg, Oral, 3 times daily  . rosuvastatin (CRESTOR) 20 mg, Oral, Daily  . tadalafil (CIALIS) 5 mg, Oral, Daily at bedtime  . ticagrelor (BRILINTA) 90 mg, Oral, 2 times daily  . Vitamin D3 5,000 Units, Oral, Daily  . zolpidem (AMBIEN) 10 mg, Oral, Daily at bedtime    Cardiac Studies:   ABI 12/29/2017: Unable to obtain due to medial calcinosis.  Bilateral toe brachial indices are normal.  Echocardiogram  11/30/2018: 1. Poor echo window, wall motion with reduced sensitivity. Left ventricle cavity is normal in size. Normal left ventricular shape. Abnormal septal  wall motion due to post-operative coronary artery bypass graft. Doppler evidence of grade II (pseudonormal) diastolic dysfunction. Diastolic dysfunction findings suggests elevated LA/LV end diastolic pressure. Calculated EF 56%. 2. Mild (Grade I) mitral regurgitation. 3. Mild to moderate tricuspid regurgitation. Mild pulmonary hypertension. Estimated pulmonary artery systolic pressure 32 mmHg.  E[facility]ogram At [facility] [place]: Normal LV systolic function EF 13%, grade 1 diastolic dysfunction.  No significant valvular abnormality.  Coronary angiogram 11/13/2019 at [facility] [place]: Distal left main 80% stenosed, LAD occluded proximally, LIMA to LAD patent.  Left circumflex is small.  SVG to OM 2-3 has large thrombus burden and is occluded just before OM 3.  S/P thrombectomy followed by 3.5 x 38 mm Synergy and distally 2.25 x 12 mm Synergy DES. RCA is codominant.  There are tandem 80% stenosis in the mid and distal vessel.  Right PDA is chronically occluded.  SVG to RCA has a 70% proximal stenosis prior to a widely patent stent in the midportion of the graft.  Distal to the stent 80% tandem stenosis with good runoff.  Echocardiogram 01/10/2020:   The left ventricle has normal function. There is no left ventricular hypertrophy. Mild hypokinesis of the left ventricular, entire inferolateral wall. Elevated left atrial and left ventricular end-diastolic pressures. Left ventricular diastolic parameters are consistent with Grade II diastolic dysfunction (pseudonormalization). Left ventricular ejection fraction by PLAX is is 54 %. Left ventricular ejection fraction, by visual estimation,  is 50 to 55%. Left atrial size was moderately dilated.The mitral valve is normal in structure. Mild mitral valve regurgitation.The aortic valve is tricuspid. Aortic valve regurgitation is not visualized. Mild aortic valve sclerosis without stenosis. No significant  change from  [facility]  Assessment     ICD-10-CM   1. Coronary artery disease of native artery of native heart with stable angina pectoris ([place])  I25.118   2. Coronary artery disease of bypass graft of native heart with stable angina pectoris (HCC)  I25.708   3. Essential hypertension  I10   4. CKD (chronic kidney disease) stage 4, GFR 15-29 ml/min (HCC)  N18.4     EKG 12/31/2019: Normal sinus rhythm at the rate of 74 bpm, normal axis, poor R wave progression, cannot exclude anteroseptal infarct old. Nonspecific T abnormality. Frequent PVCs in bigeminal pattern. Low-voltage complexes.  EKG 11/29/2019: Normal sinus rhythm at rate of 60 bpm, left atrial abnormality, normal axis.  T wave abnormality, lateral ischemia.  Single PVC.  Compared to 11/19/2018, lateral T wave abnormality new.  Recommendations:   Allen Medina  is a 79 y.o. Caucasian male   with type 2 diabetes, stage IV CKD being followed by Dr. Elmarie Shiley, diabetic peripheral neuropathy, diabetic retinopathy, hypertension, hyperlipidemia, chronic back pain and digital joint disease and peripheral neuropathy, CAD and S/P CABG 1991 followed by redo CABG in 2003 due to occluded bypass grafts except LIMA to LAD,  MI in 2006 needing stent to SVG to RCA. Again admitted with acute inferior and lateral myocardial infarction on 11/13/2019, and had stenting to SVG to OM 2 and also to native distal OM 2 with implantation of 2 overlapping DES in [place].    Patient admitted to the hospital on 01/09/2020 and discharged 2 days later with acute diastolic heart failure, unstable angina pectoris, recommended medical therapy in view of acute on chronic stage IV kidney disease.  He now presents here for follow-up.  Recently  Fortunately he has not had any recurrence of angina pectoris, no clinical evidence of congestive heart failure, I reviewed his labs performed by his PCP, appears that his  new baseline serum creatinine is around 3.4.  In view of precarious renal function, absence of anginal symptoms, continue Brilinta along with aspirin for now and I will see him back in 3 months for follow-up.  Lipids are well controlled.  Blood pressure is also well controlled.    Adrian Prows, MD, [facility] 01/20/2020, 11:50 AM [facility] [place] Pager: [phone] Office: [phone]

## 2020-01-23 ENCOUNTER — Ambulatory Visit: Payer: Medicare Other | Admitting: Cardiology

## 2020-02-19 DIAGNOSIS — M545 Low back pain: Secondary | ICD-10-CM | POA: Diagnosis not present

## 2020-02-19 DIAGNOSIS — G894 Chronic pain syndrome: Secondary | ICD-10-CM | POA: Diagnosis not present

## 2020-03-10 DIAGNOSIS — N184 Chronic kidney disease, stage 4 (severe): Secondary | ICD-10-CM | POA: Diagnosis not present

## 2020-03-16 DIAGNOSIS — D631 Anemia in chronic kidney disease: Secondary | ICD-10-CM | POA: Diagnosis not present

## 2020-03-16 DIAGNOSIS — N2581 Secondary hyperparathyroidism of renal origin: Secondary | ICD-10-CM | POA: Diagnosis not present

## 2020-03-16 DIAGNOSIS — N184 Chronic kidney disease, stage 4 (severe): Secondary | ICD-10-CM | POA: Diagnosis not present

## 2020-03-16 DIAGNOSIS — I129 Hypertensive chronic kidney disease with stage 1 through stage 4 chronic kidney disease, or unspecified chronic kidney disease: Secondary | ICD-10-CM | POA: Diagnosis not present

## 2020-04-01 DIAGNOSIS — R600 Localized edema: Secondary | ICD-10-CM | POA: Diagnosis not present

## 2020-04-01 DIAGNOSIS — N184 Chronic kidney disease, stage 4 (severe): Secondary | ICD-10-CM | POA: Diagnosis not present

## 2020-04-01 DIAGNOSIS — L259 Unspecified contact dermatitis, unspecified cause: Secondary | ICD-10-CM | POA: Diagnosis not present

## 2020-04-01 DIAGNOSIS — I129 Hypertensive chronic kidney disease with stage 1 through stage 4 chronic kidney disease, or unspecified chronic kidney disease: Secondary | ICD-10-CM | POA: Diagnosis not present

## 2020-04-21 NOTE — Progress Notes (Signed)
Primary Physician/Referring:  Marton Redwood, MD  Patient ID: Allen Medina, male    DOB: April 02, 1941, 79 y.o.   MRN: [id number]   No chief complaint on file.   HPI   Allen Medina  is a 79 y.o. Caucasian male with type 2 diabetes, stage IV CKD being followed by Dr. Elmarie Shiley, diabetic peripheral neuropathy, diabetic retinopathy, hypertension, hyperlipidemia, chronic back pain and digital joint disease and peripheral neuropathy, CAD and S/P CABG 1991 followed by redo CABG in 2003 due to occluded bypass grafts except LIMA to LAD,  MI in 2006 needing stent to SVG to RCA. Again admitted with acute inferior and lateral myocardial infarction on 11/13/2019, and had stenting to SVG to OM 2 and also to native distal OM 2 with implantation of 2 overlapping DES in [place].    Since recent hospitalization on 01/09/2020 with unstable angina and acute diastolic heart failure, he has done well and has not had any recurrence of angina pectoris or heart failure.  In the past 3 months has used 2 S/L NTG.    Past Medical History:  Diagnosis Date  . Arthritis    "hands" (04/16/2014)  . Blindness of left eye   . CAD (coronary artery disease) of bypass graft 2000   Occluded grafts --> redo CABG  . CAD, multiple vessel 1991   last cath 2006-patent grafts, last nuc 09/10/09-no ischemia  . Chronic lower back pain   . CKD (chronic kidney disease) stage 4, GFR 15-29 ml/min (HCC)    kidney stones,cmet 02/09/12, 02/13/12 on chart  . Claudication ([place])    LE dopplers 03/17/10, normal ABIs, normal pressures  . Diabetes mellitus type 2 with complications (HCC)    renal insuff, HTN, CAD, PVD  . History of blood transfusion 2010   "after back OR" (04/16/2014)  . HTN (hypertension)    difficult to control  . Hx of myocardial infarction   . Hyperlipidemia LDL goal < 100   . Kidney stones   . Myocardial infarction ([place]) 2000   LOV with EKG Dr Rex Kras 7/12 on chart, eccho 10/03, stress test 9/10 on chart,  chest x ray 2/13 EPIC, chest CT 02/13/12 on chart  . Neuropathy in diabetes ([place])   . Peripheral vascular disease ([place])    peripheral neuropathy, claudication  . Pneumonia    hx of  . S/P CABG x 4 2000   SVG-OM1, SVG-OM 2-OM 3, SVG-diagonal, free RIMA-RPDA ( has stent in the vessel from 2003)-  . S/P CABG x 5    only LIMA-LAD still patent  . Spinal stenosis   . Thin skin   . Vitamin B 12 deficiency    Past Surgical History:  Procedure Laterality Date  . ANTERIOR LAT LUMBAR FUSION N/A 04/16/2014   Procedure: Lateral  Lumbar Two-Three/Three-Four Interbody and Fusion with Lateral Pedicle Screw Fixation Lumbar Two-Four;  Surgeon: Melina Schools, MD;  Location: [facility];  Service: Orthopedics;  Laterality: N/A;  . BACK SURGERY  2009, 2010  . CARDIAC CATHETERIZATION  06/04/1990   severe 3 vessel dz  . CARDIAC CATHETERIZATION  01/13/97   patent grafts, nl LV function  . CARDIAC CATHETERIZATION  01/28/1999   severe native CAD with disease involving all 3 SVG, recommend redo CABG  . CARDIAC CATHETERIZATION  05/16/2002   patent graft except of mod to sever stenosis in the midprotion of the RIMA to the posterior descending artery  . CARDIAC CATHETERIZATION  08/28/2003   nl lv fxn, patent  grafts to all vessels with small vessel disease  . CARDIAC CATHETERIZATION  07/07/2005   patent grafts, EF 45-50%  . CARPAL TUNNEL RELEASE Right   . CATARACT EXTRACTION W/ INTRAOCULAR LENS  IMPLANT, BILATERAL    . CHOLECYSTECTOMY    . COLONOSCOPY N/A 03/31/2017   Procedure: COLONOSCOPY;  Surgeon: Carol Ada, MD;  Location: [facility];  Service: Endoscopy;  Laterality: N/A;  . CORONARY ANGIOPLASTY WITH STENT PLACEMENT  05/22/2002   3.0x46m cypher stent covered the proximal and distal portion of RIMA, final inflation -16x60  . CDecker& 2000   only LIMA-LAD patent  . CYSTOSCOPY W/ STONE MANIPULATION  2013  . CYSTOSCOPY/RETROGRADE/URETEROSCOPY/STONE EXTRACTION WITH BASKET   02/16/2012   Procedure: CYSTOSCOPY/RETROGRADE/URETEROSCOPY/STONE EXTRACTION WITH BASKET;  Surgeon: JMalka So MD;  Location: [facility];  Service: Urology;  Laterality: Left;  Left Ureterscopy with Stone Extraction  . DOPPLER ECHOCARDIOGRAPHY  07/09/2013   EF 65-78 (up from 50-55% in 2013), minimally increased transvalvular velocity across the aortic valve suggestive of very mild stenosis to simply sclerosis. Normal diastolic indices  . DOPPLER ECHOCARDIOGRAPHY    . LATERAL FUSION LUMBAR SPINE  04/16/2014  . NM MYOVIEW LTD  02/2013   no evidence of ischemia or infarct, septal hypokinesis/dyskinesis   . REDO - CORONARY ARTERY BYPASS GRAFT  2000    previous LIMA to LAD patent; seqSVG - OM 2, OM 3, SVG-D3, SVG-OM1, fRIMA-rPDA  . TONSILLECTOMY     History reviewed. No pertinent family history.  Social History   Tobacco Use  . Smoking status: Never Smoker  . Smokeless tobacco: Never Used  Substance Use Topics  . Alcohol use: No   Marital Status: Married  ROS:   Review of Systems  Cardiovascular: Positive for chest pain, dyspnea on exertion (chronic) and leg swelling (mild and improved with decreasing amlodipine). Negative for claudication.  Musculoskeletal: Positive for back pain.  Gastrointestinal: Negative for melena.   Objective  Blood pressure (!) 129/56, pulse (!) 53, temperature 98.1 F (36.7 C), temperature source Temporal, resp. rate 17, height 5' 6"  (1.676 m), weight 199 lb (90.3 kg), SpO2 94 %. Body mass index is 32.12 kg/m.   Vitals with BMI 04/22/2020 01/20/2020 01/11/2020  Height 5' 6"  5' 6"  -  Weight 199 lbs 205 lbs 2 oz -  BMI 319.14378.29-  Systolic [id number] Diastolic 56 68 68  Pulse 53 62 59     Physical Exam  Constitutional: He appears well-developed. No distress.  Mildly obese  Neck: No JVD present.  Cardiovascular: Normal rate, regular rhythm, normal heart sounds and intact distal pulses. Exam reveals no gallop.  No murmur heard. Pulses:      Carotid  pulses are on the right side with bruit and on the left side with bruit.      Femoral pulses are 2+ on the right side and 2+ on the left side.      Popliteal pulses are 2+ on the right side and 2+ on the left side.       Dorsalis pedis pulses are 0 on the right side and 0 on the left side.       Posterior tibial pulses are 0 on the right side and 0 on the left side.  2+ bilateral pitting edema.  No JVD.   Pulmonary/Chest: Effort normal and breath sounds normal.  Abdominal: Soft. Bowel sounds are normal.   Laboratory examination:   CrCl cannot be calculated (Patient's most recent  lab result is older than the maximum 21 days allowed.).  CMP Latest Ref Rng & Units 01/11/2020 01/10/2020 01/09/2020  Glucose 70 - 99 mg/dL 83 129(H) 88  BUN 8 - 23 mg/dL 51(H) 64(H) 68(H)  Creatinine 0.61 - 1.24 mg/dL 3.20(H) 3.24(H) 3.58(H)  Sodium 135 - 145 mmol/L 139 138 142  Potassium 3.5 - 5.1 mmol/L 4.8 4.4 4.8  Chloride 98 - 111 mmol/L 108 105 108  CO2 22 - 32 mmol/L 22 22 22   Calcium 8.9 - 10.3 mg/dL 9.0 8.4(L) 9.0  Total Protein 6.0 - 8.3 g/dL - - -  Total Bilirubin 0.3 - 1.2 mg/dL - - -  Alkaline Phos 39 - 117 U/L - - -  AST 0 - 37 U/L - - -  ALT 0 - 53 U/L - - -   CBC Latest Ref Rng & Units 01/11/2020 01/10/2020 01/09/2020  WBC 4.0 - 10.5 K/uL 7.1 8.2 9.3  Hemoglobin 13.0 - 17.0 g/dL 11.0(L) 10.8(L) 11.8(L)  Hematocrit 39.0 - 52.0 % 33.2(L) 32.8(L) 36.5(L)  Platelets 150 - 400 K/uL 175 173 203   Lipid Panel     Component Value Date/Time   CHOL 88 01/10/2020 0519   TRIG 113 01/10/2020 0519   HDL 33 (L) 01/10/2020 0519   CHOLHDL 2.7 01/10/2020 0519   VLDL 23 01/10/2020 0519   LDLCALC 32 01/10/2020 0519   HEMOGLOBIN A1C Lab Results  Component Value Date   HGBA1C 6.1 (H) 01/09/2020   MPG 128.37 01/09/2020   TSH No results for input(s): TSH in the last 8760 hours.   External Labs:  Labs 03/11/2020: Vitamin D 59.  Serum glucose 121 mg, BUN 47, creatinine 2.62, EGFR 23 mL.  Sodium  143, potassium 4.8.  CMP otherwise normal.  Magnesium 2.0.  Hb 12.1/HCT 35.4, platelets 272.  Normal indicis.  01/15/2020: Serum glucose 158 mg, BUN 65, creatinine 3.4, EGFR 17.7 mL.  Potassium 4.9.  Hb 12.5/HCT 38.6, platelets 205.  Normal indicis.  04/16/2019: Vitamin B12 2000.  Serum glucose 1 1 6  mg, BUN 40, creatinine 2.6, EGFR 24 mL, potassium 4.6, CMP otherwise normal.  Hb 11.8/HCT 35.8, platelets 180.  Total cholesterol 103, triglycerides 114, HDL 36, LDL 44.  Non-HDL cholesterol 67.  TSH normal.  Medication and allergies:   No Known Allergies  Current Outpatient Medications  Medication Instructions  . albuterol (PROAIR HFA) 108 (90 Base) MCG/ACT inhaler 2 puffs, Inhalation, Every 6 hours PRN  . amLODipine (NORVASC) 10 MG tablet TAKE (1) TABLET BY MOUTH ONCE DAILY.  Marland Kitchen aspirin EC 81 mg, Oral, Daily  . carvedilol (COREG) 6.25 mg, Oral, 2 times daily  . cyanocobalamin ((VITAMIN B-12)) 1,000 mcg, Intramuscular, Every 30 days  . DM-Doxylamine-Acetaminophen (NYQUIL HBP COLD & FLU) 15-6.25-325 MG/15ML LIQD 15 mLs, Oral, Every 6 hours PRN  . Dulaglutide (TRULICITY) 1.5 mg, Subcutaneous, Every Sat  . Febuxostat (ULORIC) 80 mg, Oral, Daily  . furosemide (LASIX) 40 mg, Oral, Daily  . HYDROcodone-acetaminophen (NORCO/VICODIN) 5-325 MG tablet 1 tablet, Oral, Every 6 hours PRN  . Insulin Glargine (TOUJEO SOLOSTAR) 24-26 Units, Subcutaneous, Daily  . insulin lispro (HUMALOG KWIKPEN) 12 Units, Subcutaneous, 2 times daily  . Melatonin 5 mg, Sublingual, Daily at bedtime  . nitroGLYCERIN (NITROSTAT) 0.4 MG SL tablet DISSOLVE 1 TABLET UNDER TONGUE EVERY 5 MINUTES UP TO 15 MIN FOR CHEST PAIN. IF NO RELIEF CALL 911.  . omeprazole (PRILOSEC) 40 mg, Oral, Daily  . pregabalin (LYRICA) 75 mg, Oral, 3 times daily  . rosuvastatin (CRESTOR) 20  mg, Oral, Daily  . tadalafil (CIALIS) 5 mg, Oral, Daily at bedtime  . ticagrelor (BRILINTA) 90 mg, Oral, 2 times daily  . Vitamin D3 5,000 Units, Oral, Daily  .  zolpidem (AMBIEN) 10 mg, Oral, Daily at bedtime    Radiology:  No results found.  Cardiac Studies:   ABI 12/29/2017: Unable to obtain due to medial calcinosis.  Bilateral toe brachial indices are normal.  Echocardiogram 11/30/2018: 1. Poor echo window, wall motion with reduced sensitivity. Left ventricle cavity is normal in size. Normal left ventricular shape. Abnormal septal wall motion due to post-operative coronary artery bypass graft. Doppler evidence of grade II (pseudonormal) diastolic dysfunction. Diastolic dysfunction findings suggests elevated LA/LV end diastolic pressure. Calculated EF 56%. 2. Mild (Grade I) mitral regurgitation. 3. Mild to moderate tricuspid regurgitation. Mild pulmonary hypertension. Estimated pulmonary artery systolic pressure 32 mmHg.  Echocardiogram At [facility] [place]: Normal LV systolic function EF 02%, grade 1 diastolic dysfunction.  No significant valvular abnormality.  Coronary angiogram 11/13/2019 at [facility], [place]: Distal left main 80% stenosed, LAD occluded proximally, LIMA to LAD patent.  Left circumflex is small.  SVG to OM 2-3 has large thrombus burden and is occluded just before OM 3.  S/P thrombectomy followed by 3.5 x 38 mm Synergy and distally 2.25 x 12 mm Synergy DES. RCA is codominant.  There are tandem 80% stenosis in the mid and distal vessel.  Right PDA is chronically occluded.  SVG to RCA has a 70% proximal stenosis prior to a widely patent stent in the midportion of the graft.  Distal to the stent 80% tandem stenosis with good runoff.  Echocardiogram 01/10/2020:   The left ventricle has normal function. There is no left ventricular hypertrophy. Mild hypokinesis of the left ventricular, entire inferolateral wall. Elevated left atrial and left ventricular end-diastolic pressures. Left ventricular diastolic parameters are consistent with Grade II diastolic dysfunction  (pseudonormalization). Left ventricular ejection fraction by PLAX is is 54 %. Left ventricular ejection fraction, by visual estimation, is 50 to 55%. Left atrial size was moderately dilated.The mitral valve is normal in structure. Mild mitral valve regurgitation.The aortic valve is tricuspid. Aortic valve regurgitation is not visualized. Mild aortic valve sclerosis without stenosis. No significant change from  [facility]   Carotid artery duplex  05/01/2019: Right Carotid: Velocities in the right ICA are consistent with a 40-59%   stenosis.  Left Carotid: Velocities in the left ICA are consistent with a 1-39% stenosis.  Vertebrals: Right vertebral artery demonstrates antegrade flow. Left  vertebral  artery demonstrates retrograde flow, limited visualization..  Subclavians: Normal flow hemodynamics were seen in bilateral subclavian arteries.  EKG  12/31/2019: Normal sinus rhythm at the rate of 74 bpm, normal axis, poor R wave progression, cannot exclude anteroseptal infarct old. Nonspecific T abnormality. Frequent PVCs in bigeminal pattern. Low-voltage complexes.  11/29/2019: Normal sinus rhythm at rate of 60 bpm, left atrial abnormality, normal axis.  T wave abnormality, lateral ischemia.  Single PVC.  Compared to 11/19/2018, lateral T wave abnormality new.  Assessment     ICD-10-CM   1. Coronary artery disease of native artery of native heart with stable angina pectoris ([place])  I25.118   2. Coronary artery disease of bypass graft of native heart with stable angina pectoris (HCC)  I25.708   3. Essential hypertension  I10   4. CKD (chronic kidney disease) stage 4, GFR 15-29 ml/min (HCC)  N18.4   5. Asymptomatic  bilateral carotid artery stenosis  I65.23 PCV CAROTID DUPLEX (BILATERAL)    No orders of the defined types were placed in this encounter.  There are no discontinued medications.  Recommendations:   Allen Medina  is a 79 y.o.  Caucasian male with type 2 diabetes, stage IV CKD being followed by Dr. Elmarie Shiley, diabetic peripheral neuropathy, diabetic retinopathy, hypertension, hyperlipidemia, chronic back pain and digital joint disease and peripheral neuropathy, CAD and S/P CABG 1991 followed by redo CABG in 2003 due to occluded bypass grafts except LIMA to LAD,  MI in 2006 needing stent to SVG to RCA. Again admitted with acute inferior and lateral myocardial infarction on 11/13/2019, and had stenting to SVG to OM 2 and also to native distal OM 2 with implantation of 2 overlapping DES in [place].    Since recent hospitalization on 01/09/2020 with unstable angina and acute diastolic heart failure, he has done well and has not had any recurrence of angina pectoris or heart failure.  In view of stage IV chronic kidney disease we will continue conservative therapy only without angiography unless symptoms get worse. In the past 3 months has used 2 S/L NTG.  He is presently on beta-blockers, calcium channel blocker and also dual antiplatelet therapy.  Continue the same for now.  Dyspnea has remained stable.  No clinical evidence of acute decompensated heart failure.  With regard to carotid artery disease, he needs repeat duplex for surveillance.  Labs reviewed, renal function gradually worsening.  He is being closely followed by nephrology.  Lipids are well controlled, diabetes is also well controlled.  As he remains well compensated, continue medical therapy, OV in 6 months. External labs reviewed.   Adrian Prows, MD, [facility] 04/22/2020, 12:13 PM [facility] Pager: [phone] Office: [phone]

## 2020-04-22 ENCOUNTER — Encounter: Payer: Self-pay | Admitting: Cardiology

## 2020-04-22 ENCOUNTER — Ambulatory Visit: Payer: Medicare PPO | Admitting: Cardiology

## 2020-04-22 ENCOUNTER — Other Ambulatory Visit: Payer: Self-pay

## 2020-04-22 VITALS — BP 129/56 | HR 53 | Temp 98.1°F | Resp 17 | Ht 66.0 in | Wt 199.0 lb

## 2020-04-22 DIAGNOSIS — N184 Chronic kidney disease, stage 4 (severe): Secondary | ICD-10-CM | POA: Diagnosis not present

## 2020-04-22 DIAGNOSIS — I25118 Atherosclerotic heart disease of native coronary artery with other forms of angina pectoris: Secondary | ICD-10-CM

## 2020-04-22 DIAGNOSIS — I25708 Atherosclerosis of coronary artery bypass graft(s), unspecified, with other forms of angina pectoris: Secondary | ICD-10-CM

## 2020-04-22 DIAGNOSIS — I10 Essential (primary) hypertension: Secondary | ICD-10-CM | POA: Diagnosis not present

## 2020-04-22 DIAGNOSIS — E7849 Other hyperlipidemia: Secondary | ICD-10-CM | POA: Diagnosis not present

## 2020-04-22 DIAGNOSIS — Z0000 Encounter for general adult medical examination without abnormal findings: Secondary | ICD-10-CM | POA: Diagnosis not present

## 2020-04-22 DIAGNOSIS — I6523 Occlusion and stenosis of bilateral carotid arteries: Secondary | ICD-10-CM | POA: Diagnosis not present

## 2020-04-22 DIAGNOSIS — E538 Deficiency of other specified B group vitamins: Secondary | ICD-10-CM | POA: Diagnosis not present

## 2020-04-22 DIAGNOSIS — M109 Gout, unspecified: Secondary | ICD-10-CM | POA: Diagnosis not present

## 2020-04-22 DIAGNOSIS — E1159 Type 2 diabetes mellitus with other circulatory complications: Secondary | ICD-10-CM | POA: Diagnosis not present

## 2020-04-22 DIAGNOSIS — Z125 Encounter for screening for malignant neoplasm of prostate: Secondary | ICD-10-CM | POA: Diagnosis not present

## 2020-04-29 ENCOUNTER — Other Ambulatory Visit: Payer: Self-pay

## 2020-04-29 ENCOUNTER — Ambulatory Visit: Payer: Medicare PPO

## 2020-04-29 DIAGNOSIS — R82998 Other abnormal findings in urine: Secondary | ICD-10-CM | POA: Diagnosis not present

## 2020-04-29 DIAGNOSIS — D692 Other nonthrombocytopenic purpura: Secondary | ICD-10-CM | POA: Diagnosis not present

## 2020-04-29 DIAGNOSIS — I129 Hypertensive chronic kidney disease with stage 1 through stage 4 chronic kidney disease, or unspecified chronic kidney disease: Secondary | ICD-10-CM | POA: Diagnosis not present

## 2020-04-29 DIAGNOSIS — E1139 Type 2 diabetes mellitus with other diabetic ophthalmic complication: Secondary | ICD-10-CM | POA: Diagnosis not present

## 2020-04-29 DIAGNOSIS — E1159 Type 2 diabetes mellitus with other circulatory complications: Secondary | ICD-10-CM | POA: Diagnosis not present

## 2020-04-29 DIAGNOSIS — Z1331 Encounter for screening for depression: Secondary | ICD-10-CM | POA: Diagnosis not present

## 2020-04-29 DIAGNOSIS — I25810 Atherosclerosis of coronary artery bypass graft(s) without angina pectoris: Secondary | ICD-10-CM | POA: Diagnosis not present

## 2020-04-29 DIAGNOSIS — E1129 Type 2 diabetes mellitus with other diabetic kidney complication: Secondary | ICD-10-CM | POA: Diagnosis not present

## 2020-04-29 DIAGNOSIS — Z0000 Encounter for general adult medical examination without abnormal findings: Secondary | ICD-10-CM | POA: Diagnosis not present

## 2020-04-29 DIAGNOSIS — I6523 Occlusion and stenosis of bilateral carotid arteries: Secondary | ICD-10-CM | POA: Diagnosis not present

## 2020-04-29 DIAGNOSIS — E1149 Type 2 diabetes mellitus with other diabetic neurological complication: Secondary | ICD-10-CM | POA: Diagnosis not present

## 2020-04-29 DIAGNOSIS — I10 Essential (primary) hypertension: Secondary | ICD-10-CM | POA: Diagnosis not present

## 2020-04-29 DIAGNOSIS — J45909 Unspecified asthma, uncomplicated: Secondary | ICD-10-CM | POA: Diagnosis not present

## 2020-05-05 ENCOUNTER — Other Ambulatory Visit: Payer: Self-pay | Admitting: Cardiology

## 2020-05-05 DIAGNOSIS — I6523 Occlusion and stenosis of bilateral carotid arteries: Secondary | ICD-10-CM

## 2020-05-08 DIAGNOSIS — E1142 Type 2 diabetes mellitus with diabetic polyneuropathy: Secondary | ICD-10-CM | POA: Diagnosis not present

## 2020-05-08 DIAGNOSIS — I129 Hypertensive chronic kidney disease with stage 1 through stage 4 chronic kidney disease, or unspecified chronic kidney disease: Secondary | ICD-10-CM | POA: Diagnosis not present

## 2020-05-08 DIAGNOSIS — Z79891 Long term (current) use of opiate analgesic: Secondary | ICD-10-CM | POA: Diagnosis not present

## 2020-05-08 DIAGNOSIS — N529 Male erectile dysfunction, unspecified: Secondary | ICD-10-CM | POA: Diagnosis not present

## 2020-05-08 DIAGNOSIS — R609 Edema, unspecified: Secondary | ICD-10-CM | POA: Diagnosis not present

## 2020-05-08 DIAGNOSIS — Z794 Long term (current) use of insulin: Secondary | ICD-10-CM | POA: Diagnosis not present

## 2020-05-08 DIAGNOSIS — M109 Gout, unspecified: Secondary | ICD-10-CM | POA: Diagnosis not present

## 2020-05-08 DIAGNOSIS — Z7982 Long term (current) use of aspirin: Secondary | ICD-10-CM | POA: Diagnosis not present

## 2020-05-08 DIAGNOSIS — Z79899 Other long term (current) drug therapy: Secondary | ICD-10-CM | POA: Diagnosis not present

## 2020-05-08 DIAGNOSIS — E1122 Type 2 diabetes mellitus with diabetic chronic kidney disease: Secondary | ICD-10-CM | POA: Diagnosis not present

## 2020-05-08 DIAGNOSIS — G4700 Insomnia, unspecified: Secondary | ICD-10-CM | POA: Diagnosis not present

## 2020-05-08 DIAGNOSIS — E785 Hyperlipidemia, unspecified: Secondary | ICD-10-CM | POA: Diagnosis not present

## 2020-05-08 DIAGNOSIS — Z6831 Body mass index (BMI) 31.0-31.9, adult: Secondary | ICD-10-CM | POA: Diagnosis not present

## 2020-05-08 DIAGNOSIS — J449 Chronic obstructive pulmonary disease, unspecified: Secondary | ICD-10-CM | POA: Diagnosis not present

## 2020-05-08 DIAGNOSIS — Z7902 Long term (current) use of antithrombotics/antiplatelets: Secondary | ICD-10-CM | POA: Diagnosis not present

## 2020-05-08 DIAGNOSIS — N189 Chronic kidney disease, unspecified: Secondary | ICD-10-CM | POA: Diagnosis not present

## 2020-05-08 DIAGNOSIS — E669 Obesity, unspecified: Secondary | ICD-10-CM | POA: Diagnosis not present

## 2020-05-08 DIAGNOSIS — G8929 Other chronic pain: Secondary | ICD-10-CM | POA: Diagnosis not present

## 2020-05-25 DIAGNOSIS — N184 Chronic kidney disease, stage 4 (severe): Secondary | ICD-10-CM | POA: Diagnosis not present

## 2020-06-03 DIAGNOSIS — D631 Anemia in chronic kidney disease: Secondary | ICD-10-CM | POA: Diagnosis not present

## 2020-06-03 DIAGNOSIS — N184 Chronic kidney disease, stage 4 (severe): Secondary | ICD-10-CM | POA: Diagnosis not present

## 2020-06-03 DIAGNOSIS — N2581 Secondary hyperparathyroidism of renal origin: Secondary | ICD-10-CM | POA: Diagnosis not present

## 2020-06-03 DIAGNOSIS — I129 Hypertensive chronic kidney disease with stage 1 through stage 4 chronic kidney disease, or unspecified chronic kidney disease: Secondary | ICD-10-CM | POA: Diagnosis not present

## 2020-06-09 ENCOUNTER — Encounter (INDEPENDENT_AMBULATORY_CARE_PROVIDER_SITE_OTHER): Payer: Medicare PPO | Admitting: Ophthalmology

## 2020-06-09 ENCOUNTER — Other Ambulatory Visit: Payer: Self-pay

## 2020-06-09 DIAGNOSIS — H35033 Hypertensive retinopathy, bilateral: Secondary | ICD-10-CM | POA: Diagnosis not present

## 2020-06-09 DIAGNOSIS — E113593 Type 2 diabetes mellitus with proliferative diabetic retinopathy without macular edema, bilateral: Secondary | ICD-10-CM

## 2020-06-09 DIAGNOSIS — E11319 Type 2 diabetes mellitus with unspecified diabetic retinopathy without macular edema: Secondary | ICD-10-CM

## 2020-06-09 DIAGNOSIS — I10 Essential (primary) hypertension: Secondary | ICD-10-CM | POA: Diagnosis not present

## 2020-08-13 ENCOUNTER — Other Ambulatory Visit: Payer: Self-pay | Admitting: Cardiology

## 2020-08-13 DIAGNOSIS — I2121 ST elevation (STEMI) myocardial infarction involving left circumflex coronary artery: Secondary | ICD-10-CM

## 2020-10-16 ENCOUNTER — Ambulatory Visit: Payer: Medicare PPO | Admitting: Cardiology

## 2020-10-16 ENCOUNTER — Other Ambulatory Visit: Payer: Self-pay

## 2020-10-16 ENCOUNTER — Encounter: Payer: Self-pay | Admitting: Cardiology

## 2020-10-16 VITALS — BP 150/60 | HR 54 | Ht 66.0 in | Wt 194.0 lb

## 2020-10-16 DIAGNOSIS — I6523 Occlusion and stenosis of bilateral carotid arteries: Secondary | ICD-10-CM | POA: Diagnosis not present

## 2020-10-16 DIAGNOSIS — I493 Ventricular premature depolarization: Secondary | ICD-10-CM

## 2020-10-16 DIAGNOSIS — N184 Chronic kidney disease, stage 4 (severe): Secondary | ICD-10-CM | POA: Diagnosis not present

## 2020-10-16 DIAGNOSIS — I25708 Atherosclerosis of coronary artery bypass graft(s), unspecified, with other forms of angina pectoris: Secondary | ICD-10-CM

## 2020-10-16 DIAGNOSIS — I25118 Atherosclerotic heart disease of native coronary artery with other forms of angina pectoris: Secondary | ICD-10-CM | POA: Diagnosis not present

## 2020-10-16 DIAGNOSIS — I10 Essential (primary) hypertension: Secondary | ICD-10-CM

## 2020-10-16 MED ORDER — ASPIRIN 81 MG PO CHEW: 81.0000 mg | CHEWABLE_TABLET | Freq: Every day | ORAL | Status: DC

## 2020-10-16 MED ORDER — AMLODIPINE BESYLATE 10 MG PO TABS: 10.0000 mg | ORAL_TABLET | Freq: Every day | ORAL | 3 refills | Status: DC

## 2020-10-16 NOTE — Progress Notes (Signed)
Primary Physician/Referring:  Marton Redwood, MD  Patient ID: Allen Medina, male    DOB: [birth year], 79 y.o.   MRN: [id number]   Chief Complaint  Patient presents with  . Coronary Artery Disease  . Follow-up    HPI   Allen Medina  is a 79 y.o. Caucasian male with type 2 diabetes, stage IV CKD being followed by Dr. Elmarie Shiley, diabetic peripheral neuropathy, diabetic retinopathy, hypertension, hyperlipidemia, chronic back pain and digital joint disease and peripheral neuropathy, CAD and S/P CABG 1991 followed by redo CABG in 2003 due to occluded bypass grafts except LIMA to LAD,  MI in 2006 needing stent to SVG to RCA. Again admitted with acute inferior and lateral myocardial infarction on 11/13/2019, and had stenting to SVG to OM 2 and also to native distal OM 2 with implantation of 2 overlapping DES in [place].    Since recent hospitalization on 01/09/2020 with unstable angina and acute diastolic heart failure, he has occasional exertional angina and used nitroglycerin about 3 weeks ago with immediate relief.  Continues to have mild dyspnea on exertion but no PND or orthopnea.  Continues to have problems with insomnia.  Past Medical History:  Diagnosis Date  . Arthritis    "hands" (04/16/2014)  . Blindness of left eye   . CAD (coronary artery disease) of bypass graft 2000   Occluded grafts --> redo CABG  . CAD, multiple vessel 1991   last cath 2006-patent grafts, last nuc 09/10/09-no ischemia  . Chronic lower back pain   . CKD (chronic kidney disease) stage 4, GFR 15-29 ml/min (HCC)    kidney stones,cmet 02/09/12, 02/13/12 on chart  . Claudication ([place])    LE dopplers 03/17/10, normal ABIs, normal pressures  . Diabetes mellitus type 2 with complications (HCC)    renal insuff, HTN, CAD, PVD  . History of blood transfusion 2010   "after back OR" (04/16/2014)  . HTN (hypertension)    difficult to control  . Hx of myocardial infarction   . Hyperlipidemia LDL goal < 100    . Kidney stones   . Myocardial infarction ([place]) 2000   LOV with EKG Dr Rex Kras 7/12 on chart, eccho 10/03, stress test 9/10 on chart, chest x ray 2/13 EPIC, chest CT 02/13/12 on chart  . Neuropathy in diabetes ([place])   . Peripheral vascular disease ([place])    peripheral neuropathy, claudication  . Pneumonia    hx of  . S/P CABG x 4 2000   SVG-OM1, SVG-OM 2-OM 3, SVG-diagonal, free RIMA-RPDA ( has stent in the vessel from 2003)-  . S/P CABG x 5    only LIMA-LAD still patent  . Spinal stenosis   . Thin skin   . Vitamin B 12 deficiency    Past Surgical History:  Procedure Laterality Date  . ANTERIOR LAT LUMBAR FUSION N/A 04/16/2014   Procedure: Lateral  Lumbar Two-Three/Three-Four Interbody and Fusion with Lateral Pedicle Screw Fixation Lumbar Two-Four;  Surgeon: Melina Schools, MD;  Location: [facility];  Service: Orthopedics;  Laterality: N/A;  . BACK SURGERY  2009, 2010  . CARDIAC CATHETERIZATION  06/04/1990   severe 3 vessel dz  . CARDIAC CATHETERIZATION  01/13/97   patent grafts, nl LV function  . CARDIAC CATHETERIZATION  01/28/1999   severe native CAD with disease involving all 3 SVG, recommend redo CABG  . CARDIAC CATHETERIZATION  05/16/2002   patent graft except of mod to sever stenosis in the midprotion of the RIMA to  the posterior descending artery  . CARDIAC CATHETERIZATION  08/28/2003   nl lv fxn, patent grafts to all vessels with small vessel disease  . CARDIAC CATHETERIZATION  07/07/2005   patent grafts, EF 45-50%  . CARPAL TUNNEL RELEASE Right   . CATARACT EXTRACTION W/ INTRAOCULAR LENS  IMPLANT, BILATERAL    . CHOLECYSTECTOMY    . COLONOSCOPY N/A 03/31/2017   Procedure: COLONOSCOPY;  Surgeon: Carol Ada, MD;  Location: [facility];  Service: Endoscopy;  Laterality: N/A;  . CORONARY ANGIOPLASTY WITH STENT PLACEMENT  05/22/2002   3.0x54m cypher stent covered the proximal and distal portion of RIMA, final inflation -16x60  . [place]   only  LIMA-LAD patent  . CYSTOSCOPY W/ STONE MANIPULATION  2013  . CYSTOSCOPY/RETROGRADE/URETEROSCOPY/STONE EXTRACTION WITH BASKET  02/16/2012   Procedure: CYSTOSCOPY/RETROGRADE/URETEROSCOPY/STONE EXTRACTION WITH BASKET;  Surgeon: JMalka So MD;  Location: [facility];  Service: Urology;  Laterality: Left;  Left Ureterscopy with Stone Extraction  . DOPPLER ECHOCARDIOGRAPHY  07/09/2013   EF 65-78 (up from 50-55% in 2013), minimally increased transvalvular velocity across the aortic valve suggestive of very mild stenosis to simply sclerosis. Normal diastolic indices  . DOPPLER ECHOCARDIOGRAPHY    . LATERAL FUSION LUMBAR SPINE  04/16/2014  . NM MYOVIEW LTD  02/2013   no evidence of ischemia or infarct, septal hypokinesis/dyskinesis   . REDO - CORONARY ARTERY BYPASS GRAFT  2000    previous LIMA to LAD patent; seqSVG - OM 2, OM 3, SVG-D3, SVG-OM1, fRIMA-rPDA  . TONSILLECTOMY     History reviewed. No pertinent family history.  Social History   Tobacco Use  . Smoking status: Never Smoker  . Smokeless tobacco: Never Used  Substance Use Topics  . Alcohol use: No   Marital Status: Married  ROS:   Review of Systems  Cardiovascular: Positive for chest pain, dyspnea on exertion (chronic) and leg swelling (mild). Negative for claudication.  Musculoskeletal: Positive for back pain.  Gastrointestinal: Negative for melena.   Objective  Blood pressure (!) 150/60, pulse (!) 54, height 5' 6"  (1.676 m), weight 194 lb (88 kg), SpO2 95 %. Body mass index is 31.31 kg/m.   Vitals with BMI 10/16/2020 04/22/2020 01/20/2020  Height 5' 6"  5' 6"  5' 6"   Weight 194 lbs 199 lbs 205 lbs 2 oz  BMI 31.33 [phone] Systolic [id number] Diastolic 60 56 68  Pulse 54 53 62     Physical Exam Constitutional:      General: He is not in acute distress.    Appearance: He is well-developed.     Comments: Mildly obese  Neck:     Vascular: No JVD.  Cardiovascular:     Rate and Rhythm: Normal rate and regular rhythm.      Pulses: Intact distal pulses.          Carotid pulses are on the right side with bruit and on the left side with bruit.      Femoral pulses are 2+ on the right side and 2+ on the left side.      Popliteal pulses are 2+ on the right side and 2+ on the left side.       Dorsalis pedis pulses are 0 on the right side and 0 on the left side.       Posterior tibial pulses are 0 on the right side and 0 on the left side.     Heart sounds: Normal heart sounds. No  murmur heard.  No gallop.      Comments: No pedal edema.  No JVD.  Pulmonary:     Effort: Pulmonary effort is normal.     Breath sounds: Normal breath sounds.  Abdominal:     General: Bowel sounds are normal.     Palpations: Abdomen is soft.    Laboratory examination:   CrCl cannot be calculated (Patient's most recent lab result is older than the maximum 21 days allowed.).  CMP Latest Ref Rng & Units 01/11/2020 01/10/2020 01/09/2020  Glucose 70 - 99 mg/dL 83 129(H) 88  BUN 8 - 23 mg/dL 51(H) 64(H) 68(H)  Creatinine 0.61 - 1.24 mg/dL 3.20(H) 3.24(H) 3.58(H)  Sodium 135 - 145 mmol/L 139 138 142  Potassium 3.5 - 5.1 mmol/L 4.8 4.4 4.8  Chloride 98 - 111 mmol/L 108 105 108  CO2 22 - 32 mmol/L 22 22 22   Calcium 8.9 - 10.3 mg/dL 9.0 8.4(L) 9.0  Total Protein 6.0 - 8.3 g/dL - - -  Total Bilirubin 0.3 - 1.2 mg/dL - - -  Alkaline Phos 39 - 117 U/L - - -  AST 0 - 37 U/L - - -  ALT 0 - 53 U/L - - -   CBC Latest Ref Rng & Units 01/11/2020 01/10/2020 01/09/2020  WBC 4.0 - 10.5 K/uL 7.1 8.2 9.3  Hemoglobin 13.0 - 17.0 g/dL 11.0(L) 10.8(L) 11.8(L)  Hematocrit 39 - 52 % 33.2(L) 32.8(L) 36.5(L)  Platelets 150 - 400 K/uL 175 173 203   Lipid Panel Recent Labs    01/10/20 0519  CHOL 88  TRIG 113  LDLCALC 32  VLDL 23  HDL 33*  CHOLHDL 2.7    HEMOGLOBIN A1C Lab Results  Component Value Date   HGBA1C 6.1 (H) 01/09/2020   MPG 128.37 01/09/2020   TSH No results for input(s): TSH in the last 8760 hours.   External Labs:  Cholesterol,  total 112.000 m 04/22/2020 HDL 41 MG/DL 04/22/2020 LDL 48.000 mg 04/22/2020 Triglycerides 114.000 04/22/2020  A1C 6.100 % 04/22/2020  Hemoglobin 12.400 G/ 05/25/2020 Platelets 258.000 X1 05/25/2020    Creatinine, Serum 2.480 MG/ 05/25/2020 Potassium 4.800 mm 01/11/2020 Magnesium 1.800 MG/ 09/05/2016 ALT (SGPT) 13.000 uni 04/22/2020  TSH 1.450 04/22/2020  Labs 03/11/2020: Vitamin D 59.  Serum glucose 121 mg, BUN 47, creatinine 2.62, EGFR 23 mL.  Sodium 143, potassium 4.8.  CMP otherwise normal.  Magnesium 2.0.  Hb 12.1/HCT 35.4, platelets 272.  Normal indicis.  01/15/2020: Serum glucose 158 mg, BUN 65, creatinine 3.4, EGFR 17.7 mL.  Potassium 4.9.  Hb 12.5/HCT 38.6, platelets 205.  Normal indicis.  04/16/2019: Vitamin B12 2000.  Serum glucose 1 1 6  mg, BUN 40, creatinine 2.6, EGFR 24 mL, potassium 4.6, CMP otherwise normal.  Hb 11.8/HCT 35.8, platelets 180.  Total cholesterol 103, triglycerides 114, HDL 36, LDL 44.  Non-HDL cholesterol 67.  TSH normal.  Medication and allergies:   No Known Allergies  Current Outpatient Medications on File Prior to Visit  Medication Sig Dispense Refill  . albuterol (PROAIR HFA) 108 (90 Base) MCG/ACT inhaler Inhale 2 puffs into the lungs every 6 (six) hours as needed for wheezing or shortness of breath.    . carvedilol (COREG) 6.25 MG tablet Take 6.25 mg by mouth 2 (two) times daily.    . Cholecalciferol (VITAMIN D3) 5000 units CAPS Take 5,000 Units by mouth daily.    . cyanocobalamin (,VITAMIN B-12,) 1000 MCG/ML injection Inject 1,000 mcg into the muscle every 30 (thirty) days.     Marland Kitchen  Dulaglutide (TRULICITY) 1.5 ZO/1.0RU SOPN Inject 1.5 mg into the skin every Saturday.    . Febuxostat (ULORIC) 80 MG TABS Take 80 mg by mouth daily.    . furosemide (LASIX) 40 MG tablet Take 40 mg by mouth daily.    Marland Kitchen HYDROcodone-acetaminophen (NORCO/VICODIN) 5-325 MG tablet Take 1 tablet by mouth every 6 (six) hours as needed. 14 tablet 0  . Insulin Aspart FlexPen 100 UNIT/ML SOPN  Inject 15 mLs as directed in the morning, at noon, and at bedtime.    . Insulin Glargine (TOUJEO SOLOSTAR) 300 UNIT/ML SOPN Inject 24-26 Units into the skin daily.     . insulin lispro (HUMALOG KWIKPEN) 100 UNIT/ML KiwkPen Inject 12 Units into the skin 2 (two) times daily.     . Melatonin 5 MG SUBL Place 5 mg under the tongue at bedtime.     . nitroGLYCERIN (NITROSTAT) 0.4 MG SL tablet DISSOLVE 1 TABLET UNDER TONGUE EVERY 5 MINUTES UP TO 15 MIN FOR CHEST PAIN. IF NO RELIEF CALL 911. (Patient taking differently: Place 0.4 mg under the tongue every 5 (five) minutes as needed for chest pain. ) 25 tablet 0  . omeprazole (PRILOSEC) 40 MG capsule Take 40 mg by mouth daily.    . pregabalin (LYRICA) 75 MG capsule Take 75 mg by mouth 3 (three) times daily.    . rosuvastatin (CRESTOR) 20 MG tablet Take 1 tablet (20 mg total) by mouth daily. (Patient taking differently: Take 10 mg by mouth daily. ) 90 tablet 3  . tadalafil (CIALIS) 5 MG tablet Take 5 mg by mouth at bedtime.     Marland Kitchen zolpidem (AMBIEN) 10 MG tablet Take 10 mg by mouth at bedtime.     No current facility-administered medications on file prior to visit.    Radiology:  No results found.  Cardiac Studies:   ABI 12/29/2017: Unable to obtain due to medial calcinosis.  Bilateral toe brachial indices are normal.  Echocardiogram 11/30/2018: 1. Poor echo window, wall motion with reduced sensitivity. Left ventricle cavity is normal in size. Normal left ventricular shape. Abnormal septal wall motion due to post-operative coronary artery bypass graft. Doppler evidence of grade II (pseudonormal) diastolic dysfunction. Diastolic dysfunction findings suggests elevated LA/LV end diastolic pressure. Calculated EF 56%. 2. Mild (Grade I) mitral regurgitation. 3. Mild to moderate tricuspid regurgitation. Mild pulmonary hypertension. Estimated pulmonary artery systolic pressure 32 mmHg.  [facility]ogram [facility] [place]: Normal LV  systolic function EF 04%, grade 1 diastolic dysfunction.  No significant valvular abnormality.  Coronary angiogram 11/13/2019 at [facility], [place]: Distal left main 80% stenosed, LAD occluded proximally, LIMA to LAD patent.  Left circumflex is small.  SVG to OM 2-3 has large thrombus burden and is occluded just before OM 3.  S/P thrombectomy followed by 3.5 x 38 mm Synergy and distally 2.25 x 12 mm Synergy DES. RCA is codominant.  There are tandem 80% stenosis in the mid and distal vessel.  Right PDA is chronically occluded.  SVG to RCA has a 70% proximal stenosis prior to a widely patent stent in the midportion of the graft.  Distal to the stent 80% tandem stenosis with good runoff.  Echocardiogram 01/10/2020:   The left ventricle has normal function. There is no left ventricular hypertrophy. Mild hypokinesis of the left ventricular, entire inferolateral wall. Elevated left atrial and left ventricular end-diastolic pressures. Left ventricular diastolic parameters are consistent with Grade II diastolic dysfunction (pseudonormalization). Left ventricular ejection fraction by  PLAX is is 54 %. Left ventricular ejection fraction, by visual estimation, is 50 to 55%. Left atrial size was moderately dilated.The mitral valve is normal in structure. Mild mitral valve regurgitation.The aortic valve is tricuspid. Aortic valve regurgitation is not visualized. Mild aortic valve sclerosis without stenosis. No significant change from  [facility]   Carotid artery duplex  04/29/2020: Stenosis in the right internal carotid artery (16-49%). Stenosis in the right external carotid artery (<50%). Stenosis in the left internal carotid artery (50-69%). Stenosis in the left external carotid artery (<50%). Antegrade right vertebral artery flow. Left vertebral artery flow is not visualized. Follow up in six months is appropriate if clinically  indicated.  Compared to report from 05/01/2019, there is progression of left ICA stenosis from less than 40%.  EKG   EKG 10/16/2020: Normal sinus rhythm at rate of 63 bpm, normal axis, frequent PVCs in a bigeminal pattern.  Nonspecific T abnormality.  No significant change from 12/31/2019.  Assessment     ICD-10-CM   1. Coronary artery disease of native artery of native heart with stable angina pectoris (HCC)  I25.118 EKG 12-Lead    aspirin (ASPIRIN CHILDRENS) 81 MG chewable tablet    CANCELED: EKG 12-Lead  2. Coronary artery disease of bypass graft of native heart with stable angina pectoris (HCC)  I25.708   3. Asymptomatic bilateral carotid artery stenosis  I65.23   4. CKD (chronic kidney disease) stage 4, GFR 15-29 ml/min (HCC)  N18.4   5. Essential hypertension  I10 amLODipine (NORVASC) 10 MG tablet  6. Frequent PVCs  I49.3     Meds ordered this encounter  Medications  . amLODipine (NORVASC) 10 MG tablet    Sig: Take 1 tablet (10 mg total) by mouth daily.    Dispense:  90 tablet    Refill:  3  . aspirin (ASPIRIN CHILDRENS) 81 MG chewable tablet    Sig: Chew 1 tablet (81 mg total) by mouth daily.   Medications Discontinued During This Encounter  Medication Reason  . aspirin EC 81 MG tablet Completed Course  . DM-Doxylamine-Acetaminophen (NYQUIL HBP COLD & FLU) 15-6.25-325 MG/15ML LIQD Completed Course  . amLODipine (NORVASC) 5 MG tablet Dose change  . amLODipine (NORVASC) 10 MG tablet   . BRILINTA 90 MG TABS tablet Change in therapy    Recommendations:   Allen Medina  is a 79 y.o. Caucasian male with type 2 diabetes, stage IV CKD being followed by Dr. Elmarie Shiley, diabetic peripheral neuropathy, diabetic retinopathy, hypertension, hyperlipidemia, chronic back pain and DJD and asymptomatic bilateral carotid stenosis, CAD and S/P CABG 1991 followed by redo CABG in 2003 due to occluded bypass grafts except LIMA to LAD,  MI in 2006 needing stent to SVG to RCA, acute inferior and  lateral myocardial infarction on 11/13/2019, and had stenting to SVG to OM 2 and also to native distal OM 2 with implantation of 2 overlapping DES in [place].    Since recent hospitalization on 01/09/2020 with unstable angina and acute diastolic heart failure, he has done well.  He has chronic stable angina and also chronic dyspnea but without clinical evidence of heart failure.  Blood pressure is elevated today, he was on amlodipine 10 mg which was reduced to 5 mg due to well-controlled blood pressure and also occasional episodes of low blood pressure but completely asymptomatic.  Also previously on 10 mg amlodipine he had leg edema, he will report to me if he has recurrence of  leg edema and I will decrease it back to 5 mg of amlodipine daily.  Hence I increased it back to 10 mg daily.  This is his six month OV. He is presently on beta-blockers, calcium channel blocker and also dual antiplatelet therapy.  Continue the same for now.  Dyspnea has remained stable.  No clinical evidence of acute decompensated heart failure.  On the EKG he does have frequent PVCs but essentially unchanged from prior EKG.  There is no dizziness or syncope.  With regard to carotid artery disease, he needs repeat duplex for surveillance. Lipids under excellent control and renal function by recent BMP improved with S Cr around 2.4 compared to prior >3.0. He is being closely followed by nephrology.    As he remains well compensated, continue medical therapy, OV in 6 months. External labs reviewed.    Adrian Prows, MD, [facility] 10/16/2020, 9:16 AM Office: [phone] Pager: [phone]

## 2020-10-19 DIAGNOSIS — N184 Chronic kidney disease, stage 4 (severe): Secondary | ICD-10-CM | POA: Diagnosis not present

## 2020-10-26 ENCOUNTER — Ambulatory Visit: Payer: Medicare PPO | Admitting: Student

## 2020-10-26 ENCOUNTER — Ambulatory Visit: Payer: Medicare PPO | Admitting: Cardiology

## 2020-10-27 ENCOUNTER — Ambulatory Visit: Payer: Medicare PPO

## 2020-10-27 ENCOUNTER — Other Ambulatory Visit: Payer: Self-pay

## 2020-10-27 DIAGNOSIS — I6523 Occlusion and stenosis of bilateral carotid arteries: Secondary | ICD-10-CM | POA: Diagnosis not present

## 2020-10-28 ENCOUNTER — Other Ambulatory Visit: Payer: Self-pay | Admitting: Cardiology

## 2020-10-28 DIAGNOSIS — I6523 Occlusion and stenosis of bilateral carotid arteries: Secondary | ICD-10-CM

## 2020-10-29 DIAGNOSIS — N2581 Secondary hyperparathyroidism of renal origin: Secondary | ICD-10-CM | POA: Diagnosis not present

## 2020-10-29 DIAGNOSIS — I129 Hypertensive chronic kidney disease with stage 1 through stage 4 chronic kidney disease, or unspecified chronic kidney disease: Secondary | ICD-10-CM | POA: Diagnosis not present

## 2020-10-29 DIAGNOSIS — I25810 Atherosclerosis of coronary artery bypass graft(s) without angina pectoris: Secondary | ICD-10-CM | POA: Diagnosis not present

## 2020-10-29 DIAGNOSIS — I6523 Occlusion and stenosis of bilateral carotid arteries: Secondary | ICD-10-CM | POA: Diagnosis not present

## 2020-10-29 DIAGNOSIS — E1159 Type 2 diabetes mellitus with other circulatory complications: Secondary | ICD-10-CM | POA: Diagnosis not present

## 2020-10-29 DIAGNOSIS — E785 Hyperlipidemia, unspecified: Secondary | ICD-10-CM | POA: Diagnosis not present

## 2020-10-29 DIAGNOSIS — N184 Chronic kidney disease, stage 4 (severe): Secondary | ICD-10-CM | POA: Diagnosis not present

## 2020-10-29 DIAGNOSIS — E11319 Type 2 diabetes mellitus with unspecified diabetic retinopathy without macular edema: Secondary | ICD-10-CM | POA: Diagnosis not present

## 2020-10-29 DIAGNOSIS — E1142 Type 2 diabetes mellitus with diabetic polyneuropathy: Secondary | ICD-10-CM | POA: Diagnosis not present

## 2020-11-06 DIAGNOSIS — N184 Chronic kidney disease, stage 4 (severe): Secondary | ICD-10-CM | POA: Diagnosis not present

## 2020-11-06 DIAGNOSIS — I129 Hypertensive chronic kidney disease with stage 1 through stage 4 chronic kidney disease, or unspecified chronic kidney disease: Secondary | ICD-10-CM | POA: Diagnosis not present

## 2020-11-06 DIAGNOSIS — N2581 Secondary hyperparathyroidism of renal origin: Secondary | ICD-10-CM | POA: Diagnosis not present

## 2020-11-06 DIAGNOSIS — D631 Anemia in chronic kidney disease: Secondary | ICD-10-CM | POA: Diagnosis not present

## 2020-11-27 DIAGNOSIS — Z79899 Other long term (current) drug therapy: Secondary | ICD-10-CM | POA: Diagnosis not present

## 2020-11-27 DIAGNOSIS — E113591 Type 2 diabetes mellitus with proliferative diabetic retinopathy without macular edema, right eye: Secondary | ICD-10-CM | POA: Diagnosis not present

## 2020-11-27 DIAGNOSIS — H401133 Primary open-angle glaucoma, bilateral, severe stage: Secondary | ICD-10-CM | POA: Diagnosis not present

## 2020-11-27 DIAGNOSIS — Z961 Presence of intraocular lens: Secondary | ICD-10-CM | POA: Diagnosis not present

## 2020-11-27 DIAGNOSIS — G894 Chronic pain syndrome: Secondary | ICD-10-CM | POA: Diagnosis not present

## 2020-11-27 DIAGNOSIS — H524 Presbyopia: Secondary | ICD-10-CM | POA: Diagnosis not present

## 2020-11-27 DIAGNOSIS — E113512 Type 2 diabetes mellitus with proliferative diabetic retinopathy with macular edema, left eye: Secondary | ICD-10-CM | POA: Diagnosis not present

## 2021-03-08 DIAGNOSIS — N184 Chronic kidney disease, stage 4 (severe): Secondary | ICD-10-CM | POA: Diagnosis not present

## 2021-03-17 DIAGNOSIS — D631 Anemia in chronic kidney disease: Secondary | ICD-10-CM | POA: Diagnosis not present

## 2021-03-17 DIAGNOSIS — N1832 Chronic kidney disease, stage 3b: Secondary | ICD-10-CM | POA: Diagnosis not present

## 2021-03-17 DIAGNOSIS — I129 Hypertensive chronic kidney disease with stage 1 through stage 4 chronic kidney disease, or unspecified chronic kidney disease: Secondary | ICD-10-CM | POA: Diagnosis not present

## 2021-03-17 DIAGNOSIS — N2581 Secondary hyperparathyroidism of renal origin: Secondary | ICD-10-CM | POA: Diagnosis not present

## 2021-04-19 ENCOUNTER — Ambulatory Visit: Payer: Medicare PPO | Admitting: Cardiology

## 2021-05-07 ENCOUNTER — Other Ambulatory Visit: Payer: Self-pay

## 2021-05-07 ENCOUNTER — Ambulatory Visit: Payer: Medicare PPO

## 2021-05-07 DIAGNOSIS — I6523 Occlusion and stenosis of bilateral carotid arteries: Secondary | ICD-10-CM

## 2021-05-09 NOTE — Progress Notes (Signed)
Carotid artery duplex 05/07/2021: Stenosis in the right internal carotid artery (50-69%). Stenosis in the left internal carotid artery (50-69%). Antegrade right vertebral artery flow. Antegrade left vertebral artery flow. Compared to 10/27/2020, mild progression of the disease on the left. Follow up in six months is appropriate if clinically indicated.

## 2021-05-12 ENCOUNTER — Encounter: Payer: Self-pay | Admitting: Cardiology

## 2021-05-12 ENCOUNTER — Ambulatory Visit: Payer: Medicare PPO | Admitting: Cardiology

## 2021-05-12 ENCOUNTER — Other Ambulatory Visit: Payer: Self-pay

## 2021-05-12 VITALS — BP 145/67 | HR 67 | Temp 97.4°F | Resp 16 | Ht 66.0 in | Wt 193.4 lb

## 2021-05-12 DIAGNOSIS — N184 Chronic kidney disease, stage 4 (severe): Secondary | ICD-10-CM | POA: Diagnosis not present

## 2021-05-12 DIAGNOSIS — E785 Hyperlipidemia, unspecified: Secondary | ICD-10-CM | POA: Diagnosis not present

## 2021-05-12 DIAGNOSIS — Z125 Encounter for screening for malignant neoplasm of prostate: Secondary | ICD-10-CM | POA: Diagnosis not present

## 2021-05-12 DIAGNOSIS — E1129 Type 2 diabetes mellitus with other diabetic kidney complication: Secondary | ICD-10-CM | POA: Diagnosis not present

## 2021-05-12 DIAGNOSIS — I6523 Occlusion and stenosis of bilateral carotid arteries: Secondary | ICD-10-CM | POA: Diagnosis not present

## 2021-05-12 DIAGNOSIS — I25118 Atherosclerotic heart disease of native coronary artery with other forms of angina pectoris: Secondary | ICD-10-CM | POA: Diagnosis not present

## 2021-05-12 DIAGNOSIS — M109 Gout, unspecified: Secondary | ICD-10-CM | POA: Diagnosis not present

## 2021-05-12 DIAGNOSIS — R0609 Other forms of dyspnea: Secondary | ICD-10-CM

## 2021-05-12 DIAGNOSIS — I5032 Chronic diastolic (congestive) heart failure: Secondary | ICD-10-CM | POA: Diagnosis not present

## 2021-05-12 DIAGNOSIS — R0600 Dyspnea, unspecified: Secondary | ICD-10-CM

## 2021-05-12 DIAGNOSIS — I10 Essential (primary) hypertension: Secondary | ICD-10-CM

## 2021-05-12 DIAGNOSIS — E7800 Pure hypercholesterolemia, unspecified: Secondary | ICD-10-CM | POA: Diagnosis not present

## 2021-05-12 MED ORDER — CARVEDILOL 12.5 MG PO TABS: 12.5000 mg | ORAL_TABLET | Freq: Two times a day (BID) | ORAL | 1 refills | Status: DC

## 2021-05-12 NOTE — Progress Notes (Signed)
Primary Physician/Referring:  Ginger Organ., MD  Patient ID: Allen Medina, male    DOB: 17-Jul-1941, 80 y.o.   MRN: [id number]   Chief Complaint  Patient presents with  . Coronary Artery Disease  . Hypertension  . CAROTID STENOSIS    HPI   DOMENICK QUEBEDEAUX  is a 80 y.o. Caucasian male with type 2 diabetes, stage IV CKD being followed by Dr. Elmarie Shiley, diabetic peripheral neuropathy, diabetic retinopathy, hypertension, hyperlipidemia, chronic back pain and digital joint disease and peripheral neuropathy, CAD and S/P CABG 1991 followed by redo CABG in 2003 due to occluded bypass grafts except LIMA to LAD,  MI in 2006 needing stent to SVG to RCA. Again admitted with acute inferior and lateral myocardial infarction on 11/13/2019, and had stenting to SVG to OM 2 and also to native distal OM 2 with implantation of 2 overlapping DES in [place].    He now presents for follow-up of congestive heart failure, diastolic and also angina pectoris.  He has noticed gradual worsening dyspnea and also exertional chest tightness.  He has been using sublingual nitroglycerin and has reduced his physical activity.  Continues to have problems with insomnia.  Past Medical History:  Diagnosis Date  . Arthritis    "hands" (04/16/2014)  . Blindness of left eye   . CAD (coronary artery disease) of bypass graft 2000   Occluded grafts --> redo CABG  . CAD, multiple vessel 1991   last cath 2006-patent grafts, last nuc 09/10/09-no ischemia  . Chronic lower back pain   . CKD (chronic kidney disease) stage 4, GFR 15-29 ml/min (HCC)    kidney stones,cmet 02/09/12, 02/13/12 on chart  . Claudication ([place])    LE dopplers 03/17/10, normal ABIs, normal pressures  . Diabetes mellitus type 2 with complications (HCC)    renal insuff, HTN, CAD, PVD  . History of blood transfusion 2010   "after back OR" (04/16/2014)  . HTN (hypertension)    difficult to control  . Hx of myocardial infarction   .  Hyperlipidemia LDL goal < 100   . Kidney stones   . Myocardial infarction ([place]) 2000   LOV with EKG Dr Rex Kras 7/12 on chart, eccho 10/03, stress test 9/10 on chart, chest x ray 2/13 EPIC, chest CT 02/13/12 on chart  . Neuropathy in diabetes ([place])   . Peripheral vascular disease ([place])    peripheral neuropathy, claudication  . Pneumonia    hx of  . S/P CABG x 4 2000   SVG-OM1, SVG-OM 2-OM 3, SVG-diagonal, free RIMA-RPDA ( has stent in the vessel from 2003)-  . S/P CABG x 5    only LIMA-LAD still patent  . Spinal stenosis   . Thin skin   . Vitamin B 12 deficiency    Past Surgical History:  Procedure Laterality Date  . ANTERIOR LAT LUMBAR FUSION N/A 04/16/2014   Procedure: Lateral  Lumbar Two-Three/Three-Four Interbody and Fusion with Lateral Pedicle Screw Fixation Lumbar Two-Four;  Surgeon: Melina Schools, MD;  Location: [facility];  Service: Orthopedics;  Laterality: N/A;  . BACK SURGERY  2009, 2010  . CARDIAC CATHETERIZATION  06/04/1990   severe 3 vessel dz  . CARDIAC CATHETERIZATION  01/13/97   patent grafts, nl LV function  . CARDIAC CATHETERIZATION  01/28/1999   severe native CAD with disease involving all 3 SVG, recommend redo CABG  . CARDIAC CATHETERIZATION  05/16/2002   patent graft except of mod to sever stenosis in the midprotion  of the [place] to the posterior descending artery  . CARDIAC CATHETERIZATION  08/28/2003   nl lv fxn, patent grafts to all vessels with small vessel disease  . CARDIAC CATHETERIZATION  07/07/2005   patent grafts, EF 45-50%  . CARPAL TUNNEL RELEASE Right   . CATARACT EXTRACTION W/ INTRAOCULAR LENS  IMPLANT, BILATERAL    . CHOLECYSTECTOMY    . COLONOSCOPY N/A 03/31/2017   Procedure: COLONOSCOPY;  Surgeon: Carol Ada, MD;  Location: [facility];  Service: Endoscopy;  Laterality: N/A;  . CORONARY ANGIOPLASTY WITH STENT PLACEMENT  05/22/2002   3.0x44m cypher stent covered the proximal and distal portion of RIMA, final inflation -16x60  . CSheridan& 2000   only LIMA-LAD patent  . CYSTOSCOPY W/ STONE MANIPULATION  2013  . CYSTOSCOPY/RETROGRADE/URETEROSCOPY/STONE EXTRACTION WITH BASKET  02/16/2012   Procedure: CYSTOSCOPY/RETROGRADE/URETEROSCOPY/STONE EXTRACTION WITH BASKET;  Surgeon: JMalka So MD;  Location: [facility];  Service: Urology;  Laterality: Left;  Left Ureterscopy with Stone Extraction  . DOPPLER ECHOCARDIOGRAPHY  07/09/2013   EF 65-78 (up from 50-55% in 2013), minimally increased transvalvular velocity across the aortic valve suggestive of very mild stenosis to simply sclerosis. Normal diastolic indices  . DOPPLER ECHOCARDIOGRAPHY    . LATERAL FUSION LUMBAR SPINE  04/16/2014  . NM MYOVIEW LTD  02/2013   no evidence of ischemia or infarct, septal hypokinesis/dyskinesis   . REDO - CORONARY ARTERY BYPASS GRAFT  2000    previous LIMA to LAD patent; seqSVG - OM 2, OM 3, SVG-D3, SVG-OM1, fRIMA-rPDA  . TONSILLECTOMY     Family History  Problem Relation Age of Onset  . Heart disease Father   . Diabetes Father   . Heart attack Father   . Arthritis-Osteo Sister   . Arthritis-Osteo Sister     Social History   Tobacco Use  . Smoking status: Never Smoker  . Smokeless tobacco: Never Used  Substance Use Topics  . Alcohol use: No   Marital Status: Married  ROS:   Review of Systems  Constitutional: Negative.  Cardiovascular: Positive for chest pain, dyspnea on exertion (chronic) and leg swelling (mild). Negative for claudication.  Respiratory: Negative for sleep disturbances due to breathing and sputum production.   Musculoskeletal: Positive for back pain.  Gastrointestinal: Negative for melena.   Objective  Blood pressure (!) 145/67, pulse 67, temperature (!) 97.4 F (36.3 C), temperature source Temporal, resp. rate 16, height 5' 6"  (1.676 m), weight 193 lb 6.4 oz (87.7 kg), SpO2 95 %. Body mass index is 31.22 kg/m.   Vitals with BMI 05/12/2021 10/16/2020 04/22/2020  Height 5' 6"  5' 6"  5' 6"   Weight 193  lbs 6 oz 194 lbs 199 lbs  BMI 31.23 [phone] Systolic [id number] Diastolic 67 60 56  Pulse 67 54 53     Physical Exam Constitutional:      General: He is not in acute distress.    Appearance: He is well-developed.     Comments: Mildly obese  Neck:     Vascular: No JVD.  Cardiovascular:     Rate and Rhythm: Normal rate and regular rhythm.     Pulses: Intact distal pulses.          Carotid pulses are on the right side with bruit and on the left side with bruit.      Femoral pulses are 2+ on the right side and 2+ on the left side.      Popliteal pulses  are 2+ on the right side and 2+ on the left side.       Dorsalis pedis pulses are 0 on the right side and 0 on the left side.       Posterior tibial pulses are 0 on the right side and 0 on the left side.     Heart sounds: Normal heart sounds. No murmur heard. No gallop.   Pulmonary:     Effort: Pulmonary effort is normal.     Breath sounds: Rales (right mid and base posterior) present.  Abdominal:     General: Bowel sounds are normal.     Palpations: Abdomen is soft.  Musculoskeletal:        General: Swelling (1-2+ bilateral pitting pedal edema) present.    Laboratory examination:   CrCl cannot be calculated (Patient's most recent lab result is older than the maximum 21 days allowed.).  CMP Latest Ref Rng & Units 01/11/2020 01/10/2020 01/09/2020  Glucose 70 - 99 mg/dL 83 129(H) 88  BUN 8 - 23 mg/dL 51(H) 64(H) 68(H)  Creatinine 0.61 - 1.24 mg/dL 3.20(H) 3.24(H) 3.58(H)  Sodium 135 - 145 mmol/L 139 138 142  Potassium 3.5 - 5.1 mmol/L 4.8 4.4 4.8  Chloride 98 - 111 mmol/L 108 105 108  CO2 22 - 32 mmol/L 22 22 22   Calcium 8.9 - 10.3 mg/dL 9.0 8.4(L) 9.0  Total Protein 6.0 - 8.3 g/dL - - -  Total Bilirubin 0.3 - 1.2 mg/dL - - -  Alkaline Phos 39 - 117 U/L - - -  AST 0 - 37 U/L - - -  ALT 0 - 53 U/L - - -   CBC Latest Ref Rng & Units 01/11/2020 01/10/2020 01/09/2020  WBC 4.0 - 10.5 K/uL 7.1 8.2 9.3  Hemoglobin 13.0 - 17.0  g/dL 11.0(L) 10.8(L) 11.8(L)  Hematocrit 39.0 - 52.0 % 33.2(L) 32.8(L) 36.5(L)  Platelets 150 - 400 K/uL 175 173 203   Lipid Panel No results for input(s): CHOL, TRIG, LDLCALC, VLDL, HDL, CHOLHDL, LDLDIRECT in the last 8760 hours.  HEMOGLOBIN A1C Lab Results  Component Value Date   HGBA1C 6.1 (H) 01/09/2020   MPG 128.37 01/09/2020    BNP (last 3 results) No results for input(s): BNP in the last 8760 hours.  ProBNP (last 3 results) No results for input(s): PROBNP in the last 8760 hours.    External Labs:  Hemoglobin 12.800 G/ 03/08/2021  Creatinine, Serum 1.940 MG/ 03/08/2021  TSH 1.450 04/22/2020  Cholesterol, total 112.000 m 04/22/2020 HDL 41 MG/DL 04/22/2020 LDL 48.000 mg 04/22/2020 Triglycerides 114.000 04/22/2020  A1C 6.100 % 04/22/2020  Hemoglobin 12.400 G/ 05/25/2020 Platelets 258.000 X1 05/25/2020   Creatinine, Serum 2.480 MG/ 05/25/2020 Potassium 4.800 mm 01/11/2020 Magnesium 1.800 MG/ 09/05/2016 ALT (SGPT) 13.000 uni 04/22/2020   Labs 03/11/2020:  Vitamin D 59.  Serum glucose 121 mg, BUN 47, creatinine 2.62, EGFR 23 mL.  Sodium 143, potassium 4.8.  CMP otherwise normal.  Magnesium 2.0.  Hb 12.1/HCT 35.4, platelets 272.  Normal indicis.  01/15/2020: Serum glucose 158 mg, BUN 65, creatinine 3.4, EGFR 17.7 mL.  Potassium 4.9.  Hb 12.5/HCT 38.6, platelets 205.  Normal indicis.  04/16/2019: Vitamin B12 2000.  Serum glucose 1 1 6  mg, BUN 40, creatinine 2.6, EGFR 24 mL, potassium 4.6, CMP otherwise normal.  Hb 11.8/HCT 35.8, platelets 180.  Total cholesterol 103, triglycerides 114, HDL 36, LDL 44.  Non-HDL cholesterol 67.  TSH normal.  Medication and allergies:   No Known Allergies  Current Outpatient Medications  on File Prior to Visit  Medication Sig Dispense Refill  . albuterol (VENTOLIN HFA) 108 (90 Base) MCG/ACT inhaler Inhale 2 puffs into the lungs every 6 (six) hours as needed for wheezing or shortness of breath.    Marland Kitchen amLODipine (NORVASC) 10 MG tablet Take 1 tablet  (10 mg total) by mouth daily. 90 tablet 3  . aspirin (ASPIRIN CHILDRENS) 81 MG chewable tablet Chew 1 tablet (81 mg total) by mouth daily.    . Cholecalciferol (VITAMIN D3) 5000 units CAPS Take 5,000 Units by mouth daily.    . cyanocobalamin (,VITAMIN B-12,) 1000 MCG/ML injection Inject 1,000 mcg into the muscle every 30 (thirty) days.     . Dulaglutide 1.5 MG/0.5ML SOPN Inject 1.5 mg into the skin every Saturday.    . Febuxostat 80 MG TABS Take 80 mg by mouth daily.    . furosemide (LASIX) 40 MG tablet Take 40 mg by mouth daily.    Marland Kitchen HYDROcodone-acetaminophen (NORCO/VICODIN) 5-325 MG tablet Take 1 tablet by mouth every 6 (six) hours as needed. 14 tablet 0  . Insulin Aspart FlexPen 100 UNIT/ML SOPN Inject 15 mLs as directed in the morning, at noon, and at bedtime.    . Insulin Glargine 300 UNIT/ML SOPN Inject 24-26 Units into the skin daily.     . Melatonin 5 MG SUBL Place 5 mg under the tongue at bedtime.    . nitroGLYCERIN (NITROSTAT) 0.4 MG SL tablet DISSOLVE 1 TABLET UNDER TONGUE EVERY 5 MINUTES UP TO 15 MIN FOR CHEST PAIN. IF NO RELIEF CALL 911. (Patient taking differently: Place 0.4 mg under the tongue every 5 (five) minutes as needed for chest pain.) 25 tablet 0  . omeprazole (PRILOSEC) 40 MG capsule Take 40 mg by mouth daily.    . pregabalin (LYRICA) 75 MG capsule Take 75 mg by mouth 3 (three) times daily.    . rosuvastatin (CRESTOR) 10 MG tablet Take 10 mg by mouth daily.    . tadalafil (CIALIS) 5 MG tablet Take 5 mg by mouth at bedtime.     Marland Kitchen zolpidem (AMBIEN) 10 MG tablet Take 10 mg by mouth at bedtime.     No current facility-administered medications on file prior to visit.    Radiology:   CX-ray 01/09/2020 portable Multiple scratch that sternotomy scar noted.  Heart size within normal limits.  No focal airspace consolidation, pleural effusion or pneumothorax.  Cardiac Studies:   ABI 12/29/2017: Unable to obtain due to medial calcinosis.  Bilateral toe brachial indices are  normal.  Coronary angiogram 11/13/2019 at [facility], [place]: Distal left main 80% stenosed, LAD occluded proximally, LIMA to LAD patent.  Left circumflex is small.  SVG to OM 2-3 has large thrombus burden and is occluded just before OM 3.  S/P thrombectomy followed by 3.5 x 38 mm Synergy and distally 2.25 x 12 mm Synergy DES. RCA is codominant.  There are tandem 80% stenosis in the mid and distal vessel.  Right PDA is chronically occluded.  SVG to RCA has a 70% proximal stenosis prior to a widely patent stent in the midportion of the graft.  Distal to the stent 80% tandem stenosis with good runoff.  Echocardiogram 01/10/2020:   The left ventricle has normal function. There is no left ventricular hypertrophy. Mild hypokinesis of the left ventricular, entire inferolateral wall. Elevated left atrial and left ventricular end-diastolic pressures. Left ventricular diastolic parameters are consistent with Grade II diastolic dysfunction (pseudonormalization). Left ventricular ejection fraction by PLAX  is is 54 %. Left ventricular ejection fraction, by visual estimation, is 50 to 55%. Left atrial size was moderately dilated.The mitral valve is normal in structure. Mild mitral valve regurgitation.The aortic valve is tricuspid. Aortic valve regurgitation is not visualized. Mild aortic valve sclerosis without stenosis. No significant change from  [facility]   Carotid artery duplex 05/07/2021: Stenosis in the right internal carotid artery (50-69%).  Stenosis in the left internal carotid artery (50-69%). Antegrade right vertebral artery flow. Antegrade left vertebral artery flow. Compared to 10/27/2020, mild progression of the disease on the left. Follow up in six months is appropriate if clinically indicated.  EKG   EKG 05/12/2021: Normal sinus rhythm at rate of 65 bpm, normal axis, nonspecific T abnormality.  No evidence of  ischemia.  No significant change from 10/16/2020.    Assessment     ICD-10-CM   1. Coronary artery disease of native artery of native heart with stable angina pectoris ([place])  I25.118 PCV ECHOCARDIOGRAM COMPLETE    PCV MYOCARDIAL PERFUSION WITH LEXISCAN  2. Chronic diastolic (congestive) heart failure (HCC)  I50.32 Pro b natriuretic peptide (BNP)  3. Essential hypertension  I10 EKG 12-Lead    carvedilol (COREG) 12.5 MG tablet  4. Asymptomatic bilateral carotid artery stenosis  I65.23   5. CKD (chronic kidney disease) stage 4, GFR 15-29 ml/min (HCC)  N18.4   6. Hypercholesteremia  E78.00   7. Dyspnea on exertion  R06.00 DG Chest 2 View    Meds ordered this encounter  Medications  . carvedilol (COREG) 12.5 MG tablet    Sig: Take 1 tablet (12.5 mg total) by mouth 2 (two) times daily.    Dispense:  60 tablet    Refill:  1   Medications Discontinued During This Encounter  Medication Reason  . insulin lispro (HUMALOG KWIKPEN) 100 UNIT/ML KiwkPen Error  . rosuvastatin (CRESTOR) 20 MG tablet Error  . carvedilol (COREG) 6.25 MG tablet Reorder   Recommendations:   MATTEW CHRISWELL  is a 80 y.o. Caucasian male with type 2 diabetes, stage IV CKD being followed by Dr. Elmarie Shiley, diabetic peripheral neuropathy, diabetic retinopathy, hypertension, hyperlipidemia, chronic back pain and DJD and asymptomatic bilateral carotid stenosis, CAD and S/P CABG 1991 followed by redo CABG in 2003 due to occluded bypass grafts except LIMA to LAD,  MI in 2006 needing stent to SVG to RCA, acute inferior and lateral myocardial infarction on 11/13/2019, and had stenting to SVG to OM 2 and also to native distal OM 2 with implantation of 2 overlapping DES in [place].    His last hospitalization was in January 2021 with unstable angina and acute diastolic heart failure.  He is now having more frequent episodes of angina and also gradually worsening dyspnea.  On physical exam he also has faint diffuse crackles  right base worse than the left that are new.  He has been using sublingual nitroglycerin with relief of angina.  I will set him up for repeat echocardiogram and a Lexiscan nuclear stress test.  Patient unable to walk due to marked dyspnea.  Today there is no clinical evidence of acute decompensated heart failure. BP is mildly elevated, but generally well controlled. I did not make changes today. Check Pro BNP. CXR ordered due to coarse crackles right base  More prominent right base than left.  Lipids are controlled and renal function is stable. With regard to carotid artery disease, he needs repeat duplex for surveillance. S Cr around 1.9-2.4.  He is being closely followed by nephrology. This was a 40 minute encounter.    Adrian Prows, MD, [facility] 05/12/2021, 11:45 AM Office: [phone] Pager: [phone]

## 2021-05-13 ENCOUNTER — Ambulatory Visit: Payer: Medicare PPO

## 2021-05-13 DIAGNOSIS — I25118 Atherosclerotic heart disease of native coronary artery with other forms of angina pectoris: Secondary | ICD-10-CM

## 2021-05-13 LAB — PRO B NATRIURETIC PEPTIDE: NT-Pro BNP: 396 pg/mL (ref 0–486)

## 2021-05-19 DIAGNOSIS — R82998 Other abnormal findings in urine: Secondary | ICD-10-CM | POA: Diagnosis not present

## 2021-05-19 DIAGNOSIS — Z1331 Encounter for screening for depression: Secondary | ICD-10-CM | POA: Diagnosis not present

## 2021-05-19 DIAGNOSIS — Z1339 Encounter for screening examination for other mental health and behavioral disorders: Secondary | ICD-10-CM | POA: Diagnosis not present

## 2021-05-19 DIAGNOSIS — N184 Chronic kidney disease, stage 4 (severe): Secondary | ICD-10-CM | POA: Diagnosis not present

## 2021-05-19 DIAGNOSIS — E1129 Type 2 diabetes mellitus with other diabetic kidney complication: Secondary | ICD-10-CM | POA: Diagnosis not present

## 2021-05-19 DIAGNOSIS — Z794 Long term (current) use of insulin: Secondary | ICD-10-CM | POA: Diagnosis not present

## 2021-05-19 DIAGNOSIS — I129 Hypertensive chronic kidney disease with stage 1 through stage 4 chronic kidney disease, or unspecified chronic kidney disease: Secondary | ICD-10-CM | POA: Diagnosis not present

## 2021-05-19 DIAGNOSIS — I25810 Atherosclerosis of coronary artery bypass graft(s) without angina pectoris: Secondary | ICD-10-CM | POA: Diagnosis not present

## 2021-05-19 DIAGNOSIS — Z0000 Encounter for general adult medical examination without abnormal findings: Secondary | ICD-10-CM | POA: Diagnosis not present

## 2021-05-19 DIAGNOSIS — E11319 Type 2 diabetes mellitus with unspecified diabetic retinopathy without macular edema: Secondary | ICD-10-CM | POA: Diagnosis not present

## 2021-05-19 DIAGNOSIS — E1142 Type 2 diabetes mellitus with diabetic polyneuropathy: Secondary | ICD-10-CM | POA: Diagnosis not present

## 2021-05-19 DIAGNOSIS — E1159 Type 2 diabetes mellitus with other circulatory complications: Secondary | ICD-10-CM | POA: Diagnosis not present

## 2021-05-24 ENCOUNTER — Ambulatory Visit: Payer: Medicare PPO

## 2021-05-24 ENCOUNTER — Other Ambulatory Visit: Payer: Self-pay

## 2021-05-24 DIAGNOSIS — I25118 Atherosclerotic heart disease of native coronary artery with other forms of angina pectoris: Secondary | ICD-10-CM | POA: Diagnosis not present

## 2021-05-26 NOTE — Progress Notes (Signed)
Can you see if he qualifies for Transcend Trial

## 2021-05-30 NOTE — Progress Notes (Signed)
Lexiscan Tetrofosmin stress test 05/24/2021: Lexiscan nuclear stress test performed using 1-day protocol. SPECT images show moderate sized, medium intensity, predominantly fixed perfusion defect in mid to apical inferior/inferolateral myocardium with minimal inferior reversibility, along with mid to apical inferior/inferolateral hypokinesis. Stress LVEF 44%. High risk study.  The abnormal stress is probably related to the residual blockages that you have in your heart. Will discuss more on your visit soon.

## 2021-06-09 ENCOUNTER — Encounter (INDEPENDENT_AMBULATORY_CARE_PROVIDER_SITE_OTHER): Payer: Medicare PPO | Admitting: Ophthalmology

## 2021-06-23 ENCOUNTER — Encounter: Payer: Self-pay | Admitting: Cardiology

## 2021-06-23 ENCOUNTER — Other Ambulatory Visit: Payer: Self-pay

## 2021-06-23 ENCOUNTER — Ambulatory Visit: Payer: Medicare PPO | Admitting: Cardiology

## 2021-06-23 VITALS — BP 127/59 | HR 54 | Temp 98.4°F | Resp 16 | Ht 66.0 in | Wt 198.0 lb

## 2021-06-23 DIAGNOSIS — I10 Essential (primary) hypertension: Secondary | ICD-10-CM

## 2021-06-23 DIAGNOSIS — I5032 Chronic diastolic (congestive) heart failure: Secondary | ICD-10-CM | POA: Diagnosis not present

## 2021-06-23 DIAGNOSIS — I6523 Occlusion and stenosis of bilateral carotid arteries: Secondary | ICD-10-CM | POA: Diagnosis not present

## 2021-06-23 DIAGNOSIS — I25118 Atherosclerotic heart disease of native coronary artery with other forms of angina pectoris: Secondary | ICD-10-CM | POA: Diagnosis not present

## 2021-06-23 NOTE — Progress Notes (Signed)
Primary Physician/Referring:  Ginger Organ., MD  Patient ID: Allen Medina, male    DOB: 12-11-1941, 80 y.o.   MRN: [id number]   Chief Complaint  Patient presents with   Congestive Heart Failure   Coronary Artery Disease   Hypertension   Follow-up    6 weeks    HPI   Allen Medina  is a 80 y.o. Caucasian male with type 2 diabetes, stage IV CKD being followed by Dr. Elmarie Shiley, diabetic peripheral neuropathy, diabetic retinopathy, hypertension, hyperlipidemia, chronic back pain and digital joint disease and peripheral neuropathy, CAD and S/P CABG 1991 followed by redo CABG in 2003 due to occluded bypass grafts except LIMA to LAD,  MI in 2006 needing stent to SVG to RCA. Again admitted with acute inferior and lateral myocardial infarction on 11/13/2019, and had stenting to SVG to OM 2 and also to native distal OM 2 with implantation of 2 overlapping DES in [place].    He now presents for follow-up of congestive heart failure, diastolic and also angina pectoris.  I had seen him in May 2022, that is 6 weeks ago, he underwent nuclear stress test and echocardiogram.  He was complaining of worsening dyspnea and also angina pectoris.  States that his symptoms have resolved and dyspnea is back to baseline, no PND or orthopnea.  He has not used any sublingual nitroglycerin since last office visit 6 weeks ago.  Continues to have problems with insomnia.  Past Medical History:  Diagnosis Date   Arthritis    "hands" (04/16/2014)   Blindness of left eye    CAD (coronary artery disease) of bypass graft 2000   Occluded grafts --> redo CABG   CAD, multiple vessel 1991   last cath 2006-patent grafts, last nuc 09/10/09-no ischemia   Chronic lower back pain    CKD (chronic kidney disease) stage 4, GFR 15-29 ml/min (HCC)    kidney stones,cmet 02/09/12, 02/13/12 on chart   Claudication ([place])    LE dopplers 03/17/10, normal ABIs, normal pressures   Diabetes mellitus type 2 with  complications ([place])    renal insuff, HTN, CAD, PVD   History of blood transfusion 2010   "after back OR" (04/16/2014)   HTN (hypertension)    difficult to control   Hx of myocardial infarction    Hyperlipidemia LDL goal < 100    Kidney stones    Myocardial infarction ([place]) 2000   LOV with EKG Dr Rex Kras 7/12 on chart, eccho 10/03, stress test 9/10 on chart, chest x ray 2/13 EPIC, chest CT 02/13/12 on chart   Neuropathy in diabetes [facility])    Peripheral vascular disease ([place])    peripheral neuropathy, claudication   Pneumonia    hx of   S/P CABG x 4 2000   SVG-OM1, SVG-OM 2-OM 3, SVG-diagonal, free RIMA-RPDA ( has stent in the vessel from 2003)-   S/P CABG x 5    only LIMA-LAD still patent   Spinal stenosis    Thin skin    Vitamin B 12 deficiency    Past Surgical History:  Procedure Laterality Date   ANTERIOR LAT LUMBAR FUSION N/A 04/16/2014   Procedure: Lateral  Lumbar Two-Three/Three-Four Interbody and Fusion with Lateral Pedicle Screw Fixation Lumbar Two-Four;  Surgeon: Melina Schools, MD;  Location: [place];  Service: Orthopedics;  Laterality: N/A;   BACK SURGERY  2009, 2010   CARDIAC CATHETERIZATION  06/04/1990   severe 3 vessel dz   CARDIAC CATHETERIZATION  01/13/97   patent grafts, nl LV function   CARDIAC CATHETERIZATION  01/28/1999   severe native CAD with disease involving all 3 SVG, recommend redo CABG   CARDIAC CATHETERIZATION  05/16/2002   patent graft except of mod to sever stenosis in the midprotion of the RIMA to the posterior descending artery   CARDIAC CATHETERIZATION  08/28/2003   nl lv fxn, patent grafts to all vessels with small vessel disease   CARDIAC CATHETERIZATION  07/07/2005   patent grafts, EF 45-50%   CARPAL TUNNEL RELEASE Right    CATARACT EXTRACTION W/ INTRAOCULAR LENS  IMPLANT, BILATERAL     CHOLECYSTECTOMY     COLONOSCOPY N/A 03/31/2017   Procedure: COLONOSCOPY;  Surgeon: Carol Ada, MD;  Location: [facility];  Service: Endoscopy;  Laterality:  N/A;   CORONARY ANGIOPLASTY WITH STENT PLACEMENT  05/22/2002   3.0x98m cypher stent covered the proximal and distal portion of RIMA, final inflation -16x60   CORONARY ARTERY BYPASS GRAFT  1991 & 2000   only LIMA-LAD patent   CYSTOSCOPY W/ STONE MANIPULATION  2013   CYSTOSCOPY/RETROGRADE/URETEROSCOPY/STONE EXTRACTION WITH BASKET  02/16/2012   Procedure: CYSTOSCOPY/RETROGRADE/URETEROSCOPY/STONE EXTRACTION WITH BASKET;  Surgeon: JMalka So MD;  Location: [facility];  Service: Urology;  Laterality: Left;  Left Ureterscopy with Stone Extraction   DOPPLER ECHOCARDIOGRAPHY  07/09/2013   EF 65-78 (up from 50-55% in 2013), minimally increased transvalvular velocity across the aortic valve suggestive of very mild stenosis to simply sclerosis. Normal diastolic indices   DOPPLER ECHOCARDIOGRAPHY     LATERAL FUSION LUMBAR SPINE  04/16/2014   NM MYOVIEW LTD  02/2013   no evidence of ischemia or infarct, septal hypokinesis/dyskinesis    REDO - CORONARY ARTERY BYPASS GRAFT  2000    previous LIMA to LAD patent; seqSVG - OM 2, OM 3, SVG-D3, SVG-OM1, fRIMA-rPDA   TONSILLECTOMY     Family History  Problem Relation Age of Onset   Heart disease Father    Diabetes Father    Heart attack Father    Arthritis-Osteo Sister    Arthritis-Osteo Sister     Social History   Tobacco Use   Smoking status: Never   Smokeless tobacco: Never  Substance Use Topics   Alcohol use: No   Marital Status: Married  ROS:   Review of Systems  Constitutional: Negative.  Cardiovascular:  Positive for dyspnea on exertion (chronic) and leg swelling (mild). Negative for chest pain and claudication.  Respiratory:  Negative for sleep disturbances due to breathing and sputum production.   Musculoskeletal:  Positive for back pain.  Gastrointestinal:  Negative for melena.  Objective  Blood pressure (!) 127/59, pulse (!) 54, temperature 98.4 F (36.9 C), temperature source Temporal, resp. rate 16, height _0  (1.676 m), weight  198 lb (89.8 kg), SpO2 95 %. Body mass index is 31.96 kg/m.   Vitals with BMI 06/23/2021 05/12/2021 10/16/2020  Height _1  _2  _3   Weight 198 lbs 193 lbs 6 oz 194 lbs  BMI 31.97 [phone] Systolic [id number] Diastolic 59 67 60  Pulse 54 67 54     Physical Exam Constitutional:      General: He is not in acute distress.    Appearance: He is well-developed.     Comments: Mildly obese  Neck:     Vascular: No JVD.  Cardiovascular:     Rate and Rhythm: Normal rate and regular rhythm.     Pulses: Intact distal pulses.  Carotid pulses are  on the right side with bruit and  on the left side with bruit.      Femoral pulses are 2+ on the right side and 2+ on the left side.      Popliteal pulses are 2+ on the right side and 2+ on the left side.       Dorsalis pedis pulses are 0 on the right side and 0 on the left side.       Posterior tibial pulses are 0 on the right side and 0 on the left side.     Heart sounds: Normal heart sounds. No murmur heard.   No gallop.  Pulmonary:     Effort: Pulmonary effort is normal.     Breath sounds: Rales (right mid and base posterior) present.  Abdominal:     General: Bowel sounds are normal.     Palpations: Abdomen is soft.  Musculoskeletal:        General: Swelling (1-2+ bilateral pitting pedal edema) present.   Laboratory examination:   CrCl cannot be calculated (Patient's most recent lab result is older than the maximum 21 days allowed.).  CMP Latest Ref Rng & Units 01/11/2020 01/10/2020 01/09/2020  Glucose 70 - 99 mg/dL 83 129(H) 88  BUN 8 - 23 mg/dL 51(H) 64(H) 68(H)  Creatinine 0.61 - 1.24 mg/dL 3.20(H) 3.24(H) 3.58(H)  Sodium 135 - 145 mmol/L 139 138 142  Potassium 3.5 - 5.1 mmol/L 4.8 4.4 4.8  Chloride 98 - 111 mmol/L 108 105 108  CO2 22 - 32 mmol/L _0 Calcium 8.9 - 10.3 mg/dL 9.0 8.4(L) 9.0  Total Protein 6.0 - 8.3 g/dL - - -  Total Bilirubin 0.3 - 1.2 mg/dL - - -  Alkaline Phos 39 - 117 U/L - - -  AST 0 - 37  U/L - - -  ALT 0 - 53 U/L - - -   CBC Latest Ref Rng & Units 01/11/2020 01/10/2020 01/09/2020  WBC 4.0 - 10.5 K/uL 7.1 8.2 9.3  Hemoglobin 13.0 - 17.0 g/dL 11.0(L) 10.8(L) 11.8(L)  Hematocrit 39.0 - 52.0 % 33.2(L) 32.8(L) 36.5(L)  Platelets 150 - 400 K/uL 175 173 203   Lipid Panel No results for input(s): CHOL, TRIG, LDLCALC, VLDL, HDL, CHOLHDL, LDLDIRECT in the last 8760 hours.  HEMOGLOBIN A1C Lab Results  Component Value Date   HGBA1C 6.1 (H) 01/09/2020   MPG 128.37 01/09/2020    BNP (last 3 results) No results for input(s): BNP in the last 8760 hours.  ProBNP (last 3 results) Recent Labs    05/12/21 1238  PROBNP 396      External Labs:  Hemoglobin 12.800 G/ 03/08/2021  Creatinine, Serum 1.940 MG/ 03/08/2021  TSH 1.450 04/22/2020  Cholesterol, total 112.000 m 04/22/2020 HDL 41 MG/DL 04/22/2020 LDL 48.000 mg 04/22/2020 Triglycerides 114.000 04/22/2020  A1C 6.100 % 04/22/2020  Hemoglobin 12.400 G/ 05/25/2020 Platelets 258.000 X1 05/25/2020   Creatinine, Serum 2.480 MG/ 05/25/2020 Potassium 4.800 mm 01/11/2020 Magnesium 1.800 MG/ 09/05/2016 ALT (SGPT) 13.000 uni 04/22/2020   Labs 03/11/2020:  Vitamin D 59.  Serum glucose 121 mg, BUN 47, creatinine 2.62, EGFR 23 mL.  Sodium 143, potassium 4.8.  CMP otherwise normal.  Magnesium 2.0.  Hb 12.1/HCT 35.4, platelets 272.  Normal indicis.  01/15/2020: Serum glucose 158 mg, BUN 65, creatinine 3.4, EGFR 17.7 mL.  Potassium 4.9.  Hb 12.5/HCT 38.6, platelets 205.  Normal indicis.  04/16/2019: Vitamin B12 2000.  Serum glucose _1 mg, BUN 40,  creatinine 2.6, EGFR 24 mL, potassium 4.6, CMP otherwise normal.  Hb 11.8/HCT 35.8, platelets 180.  Total cholesterol 103, triglycerides 114, HDL 36, LDL 44.  Non-HDL cholesterol 67.  TSH normal.  Medication and allergies:   No Known Allergies  Current Outpatient Medications on File Prior to Visit  Medication Sig Dispense Refill   albuterol (VENTOLIN HFA) 108 (90 Base) MCG/ACT inhaler Inhale 2  puffs into the lungs every 6 (six) hours as needed for wheezing or shortness of breath.     amLODipine (NORVASC) 10 MG tablet Take 1 tablet (10 mg total) by mouth daily. 90 tablet 3   aspirin (ASPIRIN CHILDRENS) 81 MG chewable tablet Chew 1 tablet (81 mg total) by mouth daily.     carvedilol (COREG) 12.5 MG tablet Take 1 tablet (12.5 mg total) by mouth 2 (two) times daily. 60 tablet 1   Cholecalciferol (VITAMIN D3) 5000 units CAPS Take 5,000 Units by mouth daily.     cyanocobalamin (,VITAMIN B-12,) 1000 MCG/ML injection Inject 1,000 mcg into the muscle every 30 (thirty) days.      Dulaglutide 1.5 MG/0.5ML SOPN Inject 1.5 mg into the skin every Saturday. TRULICITY     Febuxostat 80 MG TABS Take 80 mg by mouth daily.     furosemide (LASIX) 40 MG tablet Take 40 mg by mouth daily.     HYDROcodone-acetaminophen (NORCO/VICODIN) 5-325 MG tablet Take 1 tablet by mouth every 6 (six) hours as needed. 14 tablet 0   Insulin Aspart FlexPen 100 UNIT/ML SOPN Inject 15 mLs as directed in the morning, at noon, and at bedtime.     Insulin Glargine 300 UNIT/ML SOPN Inject 24-26 Units into the skin daily. TOUJEO     Melatonin 5 MG SUBL Place 5 mg under the tongue at bedtime.     nitroGLYCERIN (NITROSTAT) 0.4 MG SL tablet DISSOLVE 1 TABLET UNDER TONGUE EVERY 5 MINUTES UP TO 15 MIN FOR CHEST PAIN. IF NO RELIEF CALL 911. (Patient taking differently: Place 0.4 mg under the tongue every 5 (five) minutes as needed for chest pain.) 25 tablet 0   omeprazole (PRILOSEC) 40 MG capsule Take 40 mg by mouth daily.     pregabalin (LYRICA) 75 MG capsule Take 75 mg by mouth 3 (three) times daily.     rosuvastatin (CRESTOR) 10 MG tablet Take 10 mg by mouth daily.     tadalafil (CIALIS) 5 MG tablet Take 5 mg by mouth at bedtime.      zolpidem (AMBIEN) 10 MG tablet Take 10 mg by mouth at bedtime.     No current facility-administered medications on file prior to visit.    Radiology:   CX-ray 01/09/2020 portable Multiple scratch that  sternotomy scar noted.  Heart size within normal limits.  No focal airspace consolidation, pleural effusion or pneumothorax.  Cardiac Studies:   ABI 12/29/2017: Unable to obtain due to medial calcinosis.  Bilateral toe brachial indices are normal.  Coronary angiogram 11/13/2019 at [facility], [place]: Distal left main 80% stenosed, LAD occluded proximally, LIMA to LAD patent.  Left circumflex is small.  SVG to OM 2-3 has large thrombus burden and is occluded just before OM 3.  S/P thrombectomy followed by 3.5 x 38 mm Synergy and distally 2.25 x 12 mm Synergy DES. RCA is codominant.  There are tandem 80% stenosis in the mid and distal vessel.  Right PDA is chronically occluded.  SVG to RCA has a 70% proximal stenosis prior to a widely patent stent  in the midportion of the graft.  Distal to the stent 80% tandem stenosis with good runoff.  Carotid artery duplex 05/07/2021: Stenosis in the right internal carotid artery (50-69%).  Stenosis in the left internal carotid artery (50-69%). Antegrade right vertebral artery flow. Antegrade left vertebral artery flow. Compared to 10/27/2020, mild progression of the disease on the left. Follow up in six months is appropriate if clinically indicated.  Echocardiogram 05/13/2021: Left ventricle cavity is normal in size and wall thickness. Abnormal septal wall motion due to post-operative coronary artery bypass graft. Normal LV systolic function with EF 56%. Doppler evidence of grade II (pseudonormal) diastolic dysfunction, elevated LAP. Mild (Grade I) mitral regurgitation. Mild tricuspid regurgitation. No evidence of pulmonary hypertension. No significant change compared to previous study in 2019.  Lexiscan Tetrofosmin stress test 05/24/2021: Lexiscan nuclear stress test performed using 1-day protocol. SPECT images show moderate sized, medium intensity, predominantly fixed perfusion defect in mid to apical inferior/inferolateral  myocardium with minimal inferior reversibility, along with mid to apical inferior/inferolateral hypokinesis. Stress LVEF 44%. High risk study.  EKG   EKG 05/12/2021: Normal sinus rhythm at rate of 65 bpm, normal axis, nonspecific T abnormality.  No evidence of ischemia.  No significant change from 10/16/2020.     Assessment     ICD-10-CM   1. Coronary artery disease of native artery of native heart with stable angina pectoris ([place])  I25.118     2. Chronic diastolic (congestive) heart failure (HCC)  I50.32     3. Essential hypertension  I10     4. Asymptomatic bilateral carotid artery stenosis  I65.23       No orders of the defined types were placed in this encounter.  There are no discontinued medications.  Recommendations:   Allen Medina  is a 80 y.o. Caucasian male with type 2 diabetes, stage IV CKD being followed by Dr. Elmarie Shiley, diabetic peripheral neuropathy, diabetic retinopathy, hypertension, hyperlipidemia, chronic back pain and DJD and asymptomatic bilateral carotid stenosis, CAD and S/P CABG 1991 followed by redo CABG in 2003 due to occluded bypass grafts except LIMA to LAD,  MI in 2006 needing stent to SVG to RCA, acute inferior and lateral myocardial infarction on 11/13/2019, and had stenting to SVG to OM 2 and also to native distal OM 2 with implantation of 2 overlapping DES in [place].    His last hospitalization was in January 2021 with unstable angina and acute diastolic heart failure.  I seen him in May 2022, he was having more frequent episodes of angina and dyspnea.  Fortunately since his last admission/office visit, he has done well and has not had recurrence of angina and he has not used any sublingual nitroglycerin.  Echocardiogram revealed stable heart function, nuclear stress test revealing ischemia that is in the known area of coronary disease.  In view of stage IV chronic kidney disease, anginal symptoms have improved on medical therapy, continue  observation for now.  Clinically intermediate to low risk study.  Patient will contact me if he has recurrence of angina pectoris or worsening dyspnea.  Today there is no clinical evidence of heart failure either.  Office visit in 6 months.  All his risk factors are well controlled including hypertension and hyperlipidemia.    I have canceled his chest x-ray as clinically stable today.  He has moderate bilateral carotid artery stenosis and needs continued surveillance of the same. I will see him back in 6 months.  Adrian Prows, MD, [facility] 06/23/2021, 11:05  AM Office: [phone] Pager: [phone]

## 2021-06-25 ENCOUNTER — Encounter (INDEPENDENT_AMBULATORY_CARE_PROVIDER_SITE_OTHER): Payer: Medicare PPO | Admitting: Ophthalmology

## 2021-07-09 ENCOUNTER — Encounter (INDEPENDENT_AMBULATORY_CARE_PROVIDER_SITE_OTHER): Payer: Medicare PPO | Admitting: Ophthalmology

## 2021-07-22 DIAGNOSIS — N1832 Chronic kidney disease, stage 3b: Secondary | ICD-10-CM | POA: Diagnosis not present

## 2021-07-27 DIAGNOSIS — D631 Anemia in chronic kidney disease: Secondary | ICD-10-CM | POA: Diagnosis not present

## 2021-07-27 DIAGNOSIS — I129 Hypertensive chronic kidney disease with stage 1 through stage 4 chronic kidney disease, or unspecified chronic kidney disease: Secondary | ICD-10-CM | POA: Diagnosis not present

## 2021-07-27 DIAGNOSIS — N2581 Secondary hyperparathyroidism of renal origin: Secondary | ICD-10-CM | POA: Diagnosis not present

## 2021-07-27 DIAGNOSIS — N1832 Chronic kidney disease, stage 3b: Secondary | ICD-10-CM | POA: Diagnosis not present

## 2021-08-02 ENCOUNTER — Encounter (INDEPENDENT_AMBULATORY_CARE_PROVIDER_SITE_OTHER): Payer: Medicare PPO | Admitting: Ophthalmology

## 2021-08-02 ENCOUNTER — Other Ambulatory Visit: Payer: Self-pay

## 2021-08-02 DIAGNOSIS — H35033 Hypertensive retinopathy, bilateral: Secondary | ICD-10-CM

## 2021-08-02 DIAGNOSIS — I10 Essential (primary) hypertension: Secondary | ICD-10-CM | POA: Diagnosis not present

## 2021-08-02 DIAGNOSIS — E113512 Type 2 diabetes mellitus with proliferative diabetic retinopathy with macular edema, left eye: Secondary | ICD-10-CM

## 2021-08-02 DIAGNOSIS — H35371 Puckering of macula, right eye: Secondary | ICD-10-CM

## 2021-08-02 DIAGNOSIS — E113591 Type 2 diabetes mellitus with proliferative diabetic retinopathy without macular edema, right eye: Secondary | ICD-10-CM | POA: Diagnosis not present

## 2021-09-13 DIAGNOSIS — E1122 Type 2 diabetes mellitus with diabetic chronic kidney disease: Secondary | ICD-10-CM | POA: Diagnosis not present

## 2021-09-13 DIAGNOSIS — I509 Heart failure, unspecified: Secondary | ICD-10-CM | POA: Diagnosis not present

## 2021-09-13 DIAGNOSIS — J449 Chronic obstructive pulmonary disease, unspecified: Secondary | ICD-10-CM | POA: Diagnosis not present

## 2021-09-13 DIAGNOSIS — Z794 Long term (current) use of insulin: Secondary | ICD-10-CM | POA: Diagnosis not present

## 2021-09-13 DIAGNOSIS — E1165 Type 2 diabetes mellitus with hyperglycemia: Secondary | ICD-10-CM | POA: Diagnosis not present

## 2021-09-13 DIAGNOSIS — Z833 Family history of diabetes mellitus: Secondary | ICD-10-CM | POA: Diagnosis not present

## 2021-09-13 DIAGNOSIS — E1142 Type 2 diabetes mellitus with diabetic polyneuropathy: Secondary | ICD-10-CM | POA: Diagnosis not present

## 2021-09-13 DIAGNOSIS — E1151 Type 2 diabetes mellitus with diabetic peripheral angiopathy without gangrene: Secondary | ICD-10-CM | POA: Diagnosis not present

## 2021-09-13 DIAGNOSIS — I130 Hypertensive heart and chronic kidney disease with heart failure and stage 1 through stage 4 chronic kidney disease, or unspecified chronic kidney disease: Secondary | ICD-10-CM | POA: Diagnosis not present

## 2021-09-13 DIAGNOSIS — E261 Secondary hyperaldosteronism: Secondary | ICD-10-CM | POA: Diagnosis not present

## 2021-10-18 ENCOUNTER — Ambulatory Visit: Payer: Medicare PPO

## 2021-10-18 ENCOUNTER — Other Ambulatory Visit: Payer: Self-pay

## 2021-10-18 DIAGNOSIS — I6523 Occlusion and stenosis of bilateral carotid arteries: Secondary | ICD-10-CM

## 2021-10-19 NOTE — Progress Notes (Signed)
Your right carotid artery shows a 50 to 60% stenosis, mild progression compared to previous, we will recheck in 6 months.  You have an appointment to see me in January 2023 and we will discuss more and I will place the order for repeat testing.

## 2021-10-20 DIAGNOSIS — Z23 Encounter for immunization: Secondary | ICD-10-CM | POA: Diagnosis not present

## 2021-11-18 DIAGNOSIS — E669 Obesity, unspecified: Secondary | ICD-10-CM | POA: Diagnosis not present

## 2021-11-18 DIAGNOSIS — D692 Other nonthrombocytopenic purpura: Secondary | ICD-10-CM | POA: Diagnosis not present

## 2021-11-18 DIAGNOSIS — I6523 Occlusion and stenosis of bilateral carotid arteries: Secondary | ICD-10-CM | POA: Diagnosis not present

## 2021-11-18 DIAGNOSIS — J45909 Unspecified asthma, uncomplicated: Secondary | ICD-10-CM | POA: Diagnosis not present

## 2021-11-18 DIAGNOSIS — Z79899 Other long term (current) drug therapy: Secondary | ICD-10-CM | POA: Diagnosis not present

## 2021-11-18 DIAGNOSIS — M5416 Radiculopathy, lumbar region: Secondary | ICD-10-CM | POA: Diagnosis not present

## 2021-11-18 DIAGNOSIS — E1142 Type 2 diabetes mellitus with diabetic polyneuropathy: Secondary | ICD-10-CM | POA: Diagnosis not present

## 2021-11-18 DIAGNOSIS — Z5181 Encounter for therapeutic drug level monitoring: Secondary | ICD-10-CM | POA: Diagnosis not present

## 2021-11-18 DIAGNOSIS — E785 Hyperlipidemia, unspecified: Secondary | ICD-10-CM | POA: Diagnosis not present

## 2021-11-18 DIAGNOSIS — N401 Enlarged prostate with lower urinary tract symptoms: Secondary | ICD-10-CM | POA: Diagnosis not present

## 2021-11-18 DIAGNOSIS — I25810 Atherosclerosis of coronary artery bypass graft(s) without angina pectoris: Secondary | ICD-10-CM | POA: Diagnosis not present

## 2021-11-18 DIAGNOSIS — G894 Chronic pain syndrome: Secondary | ICD-10-CM | POA: Diagnosis not present

## 2021-11-29 DIAGNOSIS — N1832 Chronic kidney disease, stage 3b: Secondary | ICD-10-CM | POA: Diagnosis not present

## 2021-11-30 ENCOUNTER — Other Ambulatory Visit: Payer: Self-pay | Admitting: Cardiology

## 2021-11-30 DIAGNOSIS — I10 Essential (primary) hypertension: Secondary | ICD-10-CM

## 2021-11-30 DIAGNOSIS — E7800 Pure hypercholesterolemia, unspecified: Secondary | ICD-10-CM

## 2021-12-03 DIAGNOSIS — D631 Anemia in chronic kidney disease: Secondary | ICD-10-CM | POA: Diagnosis not present

## 2021-12-03 DIAGNOSIS — I129 Hypertensive chronic kidney disease with stage 1 through stage 4 chronic kidney disease, or unspecified chronic kidney disease: Secondary | ICD-10-CM | POA: Diagnosis not present

## 2021-12-03 DIAGNOSIS — N2581 Secondary hyperparathyroidism of renal origin: Secondary | ICD-10-CM | POA: Diagnosis not present

## 2021-12-03 DIAGNOSIS — N1832 Chronic kidney disease, stage 3b: Secondary | ICD-10-CM | POA: Diagnosis not present

## 2021-12-08 ENCOUNTER — Other Ambulatory Visit: Payer: Self-pay

## 2021-12-08 ENCOUNTER — Telehealth: Payer: Self-pay | Admitting: Cardiology

## 2021-12-08 MED ORDER — NITROGLYCERIN 0.4 MG SL SUBL: 0.4000 mg | SUBLINGUAL_TABLET | SUBLINGUAL | 0 refills | Status: DC | PRN

## 2021-12-08 NOTE — Telephone Encounter (Signed)
Patient says he needs refill for nitroglycerin asap. Says pharmacy sent over a form for this a few minutes ago.

## 2021-12-08 NOTE — Telephone Encounter (Signed)
Refill sent.

## 2021-12-24 ENCOUNTER — Other Ambulatory Visit: Payer: Self-pay

## 2021-12-24 ENCOUNTER — Encounter: Payer: Self-pay | Admitting: Cardiology

## 2021-12-24 ENCOUNTER — Ambulatory Visit: Payer: Medicare PPO | Admitting: Cardiology

## 2021-12-24 VITALS — BP 151/74 | HR 68 | Temp 97.8°F | Resp 16 | Ht 66.0 in | Wt 183.0 lb

## 2021-12-24 DIAGNOSIS — I6523 Occlusion and stenosis of bilateral carotid arteries: Secondary | ICD-10-CM | POA: Diagnosis not present

## 2021-12-24 DIAGNOSIS — N184 Chronic kidney disease, stage 4 (severe): Secondary | ICD-10-CM | POA: Diagnosis not present

## 2021-12-24 DIAGNOSIS — I129 Hypertensive chronic kidney disease with stage 1 through stage 4 chronic kidney disease, or unspecified chronic kidney disease: Secondary | ICD-10-CM | POA: Diagnosis not present

## 2021-12-24 DIAGNOSIS — E1122 Type 2 diabetes mellitus with diabetic chronic kidney disease: Secondary | ICD-10-CM | POA: Diagnosis not present

## 2021-12-24 DIAGNOSIS — I25118 Atherosclerotic heart disease of native coronary artery with other forms of angina pectoris: Secondary | ICD-10-CM

## 2021-12-24 DIAGNOSIS — Z794 Long term (current) use of insulin: Secondary | ICD-10-CM

## 2021-12-24 DIAGNOSIS — E7800 Pure hypercholesterolemia, unspecified: Secondary | ICD-10-CM | POA: Diagnosis not present

## 2021-12-24 NOTE — Progress Notes (Signed)
Primary Physician/Referring:  Ginger Organ., MD  Patient ID: Allen Medina, male    DOB: 09/03/1941, 81 y.o.   MRN: [id number]   Chief Complaint  Patient presents with   Coronary Artery Disease    HPI   Allen Medina  is a 81 y.o. Caucasian male with type 2 diabetes, stage IV CKD being followed by Dr. Elmarie Shiley, diabetic peripheral neuropathy, diabetic retinopathy, hypertension, hyperlipidemia, chronic back pain and digital joint disease and peripheral neuropathy, CAD and S/P CABG 1991 followed by redo CABG in 2003 due to occluded bypass grafts except LIMA to LAD,  MI in 2006 needing stent to SVG to RCA. Again admitted with acute inferior and lateral myocardial infarction on 11/13/2019, and had stenting to SVG to OM 2 and also to native distal OM 2 with implantation of 2 overlapping DES in [place].    He now presents for follow-up of congestive heart failure, diastolic and also angina pectoris.  He has had chronic stable angina, but he has not used any sublingual nitroglycerin.  He is aware of the interaction with tadalafil.  States that for the past 3 to 4 days he has been having nausea and also loose stools and hence has held his antihypertensive medications. Past Medical History:  Diagnosis Date   Arthritis    "hands" (04/16/2014)   Blindness of left eye    CAD (coronary artery disease) of bypass graft 2000   Occluded grafts --> redo CABG   CAD, multiple vessel 1991   last cath 2006-patent grafts, last nuc 09/10/09-no ischemia   Chronic lower back pain    CKD (chronic kidney disease) stage 4, GFR 15-29 ml/min (HCC)    kidney stones,cmet 02/09/12, 02/13/12 on chart   Claudication ([place])    LE dopplers 03/17/10, normal ABIs, normal pressures   Diabetes mellitus type 2 with complications ([place])    renal insuff, HTN, CAD, PVD   History of blood transfusion 2010   "after back OR" (04/16/2014)   HTN (hypertension)    difficult to control   Hx of myocardial infarction     Hyperlipidemia LDL goal < 100    Kidney stones    Myocardial infarction ([place]) 2000   LOV with EKG Dr Rex Kras 7/12 on chart, eccho 10/03, stress test 9/10 on chart, chest x ray 2/13 EPIC, chest CT 02/13/12 on chart   Neuropathy in diabetes [facility])    Peripheral vascular disease ([place])    peripheral neuropathy, claudication   Pneumonia    hx of   S/P CABG x 4 2000   SVG-OM1, SVG-OM 2-OM 3, SVG-diagonal, free RIMA-RPDA ( has stent in the vessel from 2003)-   S/P CABG x 5    only LIMA-LAD still patent   Spinal stenosis    Thin skin    Vitamin B 12 deficiency    Past Surgical History:  Procedure Laterality Date   ANTERIOR LAT LUMBAR FUSION N/A 04/16/2014   Procedure: Lateral  Lumbar Two-Three/Three-Four Interbody and Fusion with Lateral Pedicle Screw Fixation Lumbar Two-Four;  Surgeon: Melina Schools, MD;  Location: [facility];  Service: Orthopedics;  Laterality: N/A;   BACK SURGERY  2009, 2010   CARDIAC CATHETERIZATION  06/04/1990   severe 3 vessel dz   CARDIAC CATHETERIZATION  01/13/97   patent grafts, nl LV function   CARDIAC CATHETERIZATION  01/28/1999   severe native CAD with disease involving all 3 SVG, recommend redo CABG   CARDIAC CATHETERIZATION  05/16/2002   patent  graft except of mod to sever stenosis in the midprotion of the RIMA to the posterior descending artery   CARDIAC CATHETERIZATION  08/28/2003   nl lv fxn, patent grafts to all vessels with small vessel disease   CARDIAC CATHETERIZATION  07/07/2005   patent grafts, EF 45-50%   CARPAL TUNNEL RELEASE Right    CATARACT EXTRACTION W/ INTRAOCULAR LENS  IMPLANT, BILATERAL     CHOLECYSTECTOMY     COLONOSCOPY N/A 03/31/2017   Procedure: COLONOSCOPY;  Surgeon: Carol Ada, MD;  Location: [facility];  Service: Endoscopy;  Laterality: N/A;   CORONARY ANGIOPLASTY WITH STENT PLACEMENT  05/22/2002   3.0x7m cypher stent covered the proximal and distal portion of RIMA, final inflation -16x60   CORONARY ARTERY BYPASS GRAFT  1991 &  2000   only LIMA-LAD patent   CYSTOSCOPY W/ STONE MANIPULATION  2013   CYSTOSCOPY/RETROGRADE/URETEROSCOPY/STONE EXTRACTION WITH BASKET  02/16/2012   Procedure: CYSTOSCOPY/RETROGRADE/URETEROSCOPY/STONE EXTRACTION WITH BASKET;  Surgeon: JMalka So MD;  Location: [facility];  Service: Urology;  Laterality: Left;  Left Ureterscopy with Stone Extraction   DOPPLER ECHOCARDIOGRAPHY  07/09/2013   EF 65-78 (up from 50-55% in 2013), minimally increased transvalvular velocity across the aortic valve suggestive of very mild stenosis to simply sclerosis. Normal diastolic indices   DOPPLER ECHOCARDIOGRAPHY     LATERAL FUSION LUMBAR SPINE  04/16/2014   NM MYOVIEW LTD  02/2013   no evidence of ischemia or infarct, septal hypokinesis/dyskinesis    REDO - CORONARY ARTERY BYPASS GRAFT  2000    previous LIMA to LAD patent; seqSVG - OM 2, OM 3, SVG-D3, SVG-OM1, fRIMA-rPDA   TONSILLECTOMY     Family History  Problem Relation Age of Onset   Heart disease Father    Diabetes Father    Heart attack Father    Arthritis-Osteo Sister    Arthritis-Osteo Sister     Social History   Tobacco Use   Smoking status: Never   Smokeless tobacco: Never  Substance Use Topics   Alcohol use: No   Marital Status: Married  ROS:   Review of Systems  Constitutional: Negative.  Cardiovascular:  Positive for chest pain (Stable), dyspnea on exertion (Stable) and leg swelling (Occasional). Negative for claudication.  Respiratory:  Negative for sleep disturbances due to breathing and sputum production.   Musculoskeletal:  Positive for back pain.  Gastrointestinal:  Positive for change in bowel habit. Negative for melena.  Objective  Blood pressure (!) 151/74, pulse 68, temperature 97.8 F (36.6 C), temperature source Temporal, resp. rate 16, height _0  (1.676 m), weight 183 lb (83 kg), SpO2 96 %. Body mass index is 29.54 kg/m.   Vitals with BMI 12/24/2021 12/24/2021 06/23/2021  Height - _1  _2   Weight - 183 lbs 198 lbs   BMI - 214.48318.56 Systolic [id number] Diastolic 74 73 59  Pulse 68 69 54     Physical Exam Constitutional:      General: He is not in acute distress.    Appearance: He is well-developed.     Comments: Mildly obese  Neck:     Vascular: Carotid bruit (Right carotid) present. No JVD.  Cardiovascular:     Rate and Rhythm: Normal rate and regular rhythm.     Pulses: Intact distal pulses.          Femoral pulses are 2+ on the right side and 2+ on the left side.      Popliteal pulses are 2+ on the right  side and 2+ on the left side.       Dorsalis pedis pulses are 0 on the right side and 0 on the left side.       Posterior tibial pulses are 0 on the right side and 0 on the left side.     Heart sounds: Normal heart sounds. No murmur heard.   No gallop.  Pulmonary:     Effort: Pulmonary effort is normal.     Breath sounds: Rales (right mid and base posterior) present.  Abdominal:     General: Bowel sounds are normal.     Palpations: Abdomen is soft.  Musculoskeletal:        General: Swelling (1-2+ bilateral pitting pedal edema) present.   Laboratory examination:   CrCl cannot be calculated (Patient's most recent lab result is older than the maximum 21 days allowed.).  CMP Latest Ref Rng & Units 01/11/2020 01/10/2020 01/09/2020  Glucose 70 - 99 mg/dL 83 129(H) 88  BUN 8 - 23 mg/dL 51(H) 64(H) 68(H)  Creatinine 0.61 - 1.24 mg/dL 3.20(H) 3.24(H) 3.58(H)  Sodium 135 - 145 mmol/L 139 138 142  Potassium 3.5 - 5.1 mmol/L 4.8 4.4 4.8  Chloride 98 - 111 mmol/L 108 105 108  CO2 22 - 32 mmol/L _0 Calcium 8.9 - 10.3 mg/dL 9.0 8.4(L) 9.0  Total Protein 6.0 - 8.3 g/dL - - -  Total Bilirubin 0.3 - 1.2 mg/dL - - -  Alkaline Phos 39 - 117 U/L - - -  AST 0 - 37 U/L - - -  ALT 0 - 53 U/L - - -   CBC Latest Ref Rng & Units 01/11/2020 01/10/2020 01/09/2020  WBC 4.0 - 10.5 K/uL 7.1 8.2 9.3  Hemoglobin 13.0 - 17.0 g/dL 11.0(L) 10.8(L) 11.8(L)  Hematocrit 39.0 - 52.0 % 33.2(L) 32.8(L)  36.5(L)  Platelets 150 - 400 K/uL 175 173 203   Lipid Panel No results for input(s): CHOL, TRIG, LDLCALC, VLDL, HDL, CHOLHDL, LDLDIRECT in the last 8760 hours.  HEMOGLOBIN A1C Lab Results  Component Value Date   HGBA1C 6.1 (H) 01/09/2020   MPG 128.37 01/09/2020    BNP (last 3 results) No results for input(s): BNP in the last 8760 hours.  ProBNP (last 3 results) Recent Labs    05/12/21 1238  PROBNP 396      External Labs:  Labs 11/29/2021:  BUN 31, creatinine 2.42, EGFR 27 mL, potassium 4.4.  Serum glucose 144 mg.  Magnesium 2.3.  Hb 14.3/HCT 42.2  Creatinine, Serum 1.940 MG/ 03/08/2021  TSH 1.450 04/22/2020  Cholesterol, total 112.000 m 04/22/2020 HDL 41 MG/DL 04/22/2020 LDL 48.000 mg 04/22/2020 Triglycerides 114.000 04/22/2020  A1C 6.100 % 04/22/2020   Medication and allergies:   No Known Allergies  Current Outpatient Medications on File Prior to Visit  Medication Sig Dispense Refill   amLODipine (NORVASC) 10 MG tablet TAKE (1) TABLET BY MOUTH ONCE DAILY. 90 tablet 0   aspirin (ASPIRIN CHILDRENS) 81 MG chewable tablet Chew 1 tablet (81 mg total) by mouth daily.     carvedilol (COREG) 6.25 MG tablet Take 6.25 mg by mouth daily.     Cholecalciferol (VITAMIN D3) 5000 units CAPS Take 5,000 Units by mouth daily.     cyanocobalamin (,VITAMIN B-12,) 1000 MCG/ML injection Inject 1,000 mcg into the muscle every 30 (thirty) days.      Dulaglutide 1.5 MG/0.5ML SOPN Inject 1.5 mg into the skin every Saturday. TRULICITY     Febuxostat 80 MG TABS Take 80  mg by mouth daily.     furosemide (LASIX) 40 MG tablet Take 40 mg by mouth daily.     HYDROcodone-acetaminophen (NORCO/VICODIN) 5-325 MG tablet Take 1 tablet by mouth every 6 (six) hours as needed. 14 tablet 0   Insulin Aspart FlexPen 100 UNIT/ML SOPN Inject 10-16 mLs as directed 2 (two) times daily.     Insulin Glargine 300 UNIT/ML SOPN Inject 24-26 Units into the skin daily. TOUJEO     Melatonin 5 MG SUBL Place 5 mg under the  tongue at bedtime.     nitroGLYCERIN (NITROSTAT) 0.4 MG SL tablet Place 1 tablet (0.4 mg total) under the tongue every 5 (five) minutes as needed for chest pain. DISSOLVE 1 TABLET UNDER TONGUE EVERY 5 MINUTES UP TO 15 MIN FOR CHEST PAIN. IF NO RELIEF CALL 911 25 tablet 0   omeprazole (PRILOSEC) 40 MG capsule Take 40 mg by mouth daily.     pregabalin (LYRICA) 75 MG capsule Take 75 mg by mouth 3 (three) times daily.     rosuvastatin (CRESTOR) 10 MG tablet Take 10 mg by mouth daily.     tadalafil (CIALIS) 5 MG tablet Take 5 mg by mouth at bedtime.      zolpidem (AMBIEN) 10 MG tablet Take 10 mg by mouth at bedtime.     albuterol (VENTOLIN HFA) 108 (90 Base) MCG/ACT inhaler Inhale 2 puffs into the lungs every 6 (six) hours as needed for wheezing or shortness of breath.     No current facility-administered medications on file prior to visit.    Radiology:   CX-ray 01/09/2020 portable Multiple scratch that sternotomy scar noted.  Heart size within normal limits.  No focal airspace consolidation, pleural effusion or pneumothorax.  Cardiac Studies:   ABI 12/29/2017: Unable to obtain due to medial calcinosis.  Bilateral toe brachial indices are normal.  Coronary angiogram 11/13/2019 at [facility] [place]: Distal left main 80% stenosed, LAD occluded proximally, LIMA to LAD patent.  Left circumflex is small.  SVG to OM 2-3 has large thrombus burden and is occluded just before OM 3.  S/P thrombectomy followed by 3.5 x 38 mm Synergy and distally 2.25 x 12 mm Synergy DES. RCA is codominant.  There are tandem 80% stenosis in the mid and distal vessel.  Right PDA is chronically occluded.  SVG to RCA has a 70% proximal stenosis prior to a widely patent stent in the midportion of the graft.  Distal to the stent 80% tandem stenosis with good runoff.  Echocardiogram 05/13/2021: Left ventricle cavity is normal in size and wall thickness. Abnormal septal wall motion due to  post-operative coronary artery bypass graft. Normal LV systolic function with EF 56%. Doppler evidence of grade II (pseudonormal) diastolic dysfunction, elevated LAP. Mild (Grade I) mitral regurgitation. Mild tricuspid regurgitation. No evidence of pulmonary hypertension. No significant change compared to previous study in 2019.  Lexiscan Tetrofosmin stress test 05/24/2021: Lexiscan nuclear stress test performed using 1-day protocol. SPECT images show moderate sized, medium intensity, predominantly fixed perfusion defect in mid to apical inferior/inferolateral myocardium with minimal inferior reversibility, along with mid to apical inferior/inferolateral hypokinesis. Stress LVEF 44%. High risk study.  Carotid artery duplex 10/18/2021:  Duplex suggests stenosis in the right internal carotid artery (50-69%). Duplex suggests stenosis in the left internal carotid artery (1-15%). There is mild homogenous plaque bilaterally in the carotid arteries. Compared to previous study done on 05/07/2021, there appears to be mild regression of disease on the left (previously  50-69%). Follow up in six months is appropriate if clinically indicated.  EKG   EKG 12/24/2021: Normal sinus rhythm at rate of 67 bpm, normal axis, right bundle branch block.  Low-voltage complexes.  Compared to 05/12/2021, right bundle branch block is new.   Assessment     ICD-10-CM   1. Coronary artery disease of native artery of native heart with stable angina pectoris ([place])  I25.118 EKG 12-Lead    CMP14+EGFR    2. Asymptomatic bilateral carotid artery stenosis  I65.23 PCV CAROTID DUPLEX (BILATERAL)    3. CKD (chronic kidney disease) stage 4, GFR 15-29 ml/min (HCC)  N18.4     4. Hypercholesteremia  E78.00 Lipid Panel With LDL/HDL Ratio    5. Type 2 diabetes mellitus with stage 4 chronic kidney disease, with long-term current use of insulin (HCC)  E11.22 Hgb A1c w/o eAG   N18.4    Z79.4       No orders of the defined types were  placed in this encounter.   Medications Discontinued During This Encounter  Medication Reason   carvedilol (COREG) 12.5 MG tablet     Recommendations:   Allen Medina  is a 81 y.o. Caucasian male with type 2 diabetes, stage IV CKD being followed by Dr. Elmarie Shiley, diabetic peripheral neuropathy, diabetic retinopathy, hypertension, hyperlipidemia, chronic back pain and DJD and asymptomatic bilateral carotid stenosis, CAD and S/P CABG 1991 followed by redo CABG in 2003 due to occluded bypass grafts except LIMA to LAD,  MI in 2006 needing stent to SVG to RCA, acute inferior and lateral myocardial infarction on 11/13/2019, and had stenting to SVG to OM 2 and also to native distal OM 2 with implantation of 2 overlapping DES in [place].  He presents for 67-monthoffice visit, he continues to have occasional episodes of stable angina pectoris, he has been taking tadalafil for urinary frequency, he is aware of the interaction with nitroglycerin.  Blood pressure is elevated, he has been having frequent diarrhea for the past 2 to 3 days hence he has held his antihypertensive medications.  He is also having some nausea.  I reviewed his labs from nephrology, he has basic labs only.  I will repeat CMP today to exclude any worsening renal function in view of his nausea and GI symptoms and also obtain A1c along with lipids and forwarded to PCP as well.  Carotid duplex discussed with the patient, there has been progression of carotid stenosis on the left.  We will continue to do surveillance Dopplers in 6 months and I would like to see him back then.   JAdrian Prows MD, FOver[facility] Hospital Medical Center1/05/2022, 9:09 AM Office: [phone]: [phone]

## 2021-12-25 LAB — HGB A1C W/O EAG: Hgb A1c MFr Bld: 7.3 % — ABNORMAL HIGH (ref 4.8–5.6)

## 2021-12-25 LAB — CMP14+EGFR
ALT: 16 IU/L (ref 0–44)
AST: 24 IU/L (ref 0–40)
Albumin/Globulin Ratio: 1.4 (ref 1.2–2.2)
Albumin: 4.7 g/dL (ref 3.7–4.7)
Alkaline Phosphatase: 149 IU/L — ABNORMAL HIGH (ref 44–121)
BUN/Creatinine Ratio: 17 (ref 10–24)
BUN: 45 mg/dL — ABNORMAL HIGH (ref 8–27)
Bilirubin Total: 0.5 mg/dL (ref 0.0–1.2)
CO2: 23 mmol/L (ref 20–29)
Calcium: 9.4 mg/dL (ref 8.6–10.2)
Chloride: 97 mmol/L (ref 96–106)
Creatinine, Ser: 2.6 mg/dL — ABNORMAL HIGH (ref 0.76–1.27)
Globulin, Total: 3.3 g/dL (ref 1.5–4.5)
Glucose: 203 mg/dL — ABNORMAL HIGH (ref 70–99)
Potassium: 4.6 mmol/L (ref 3.5–5.2)
Sodium: 139 mmol/L (ref 134–144)
Total Protein: 8 g/dL (ref 6.0–8.5)
eGFR: 24 mL/min/{1.73_m2} — ABNORMAL LOW (ref >59)

## 2021-12-25 LAB — LIPID PANEL WITH LDL/HDL RATIO
Cholesterol, Total: 140 mg/dL (ref 100–199)
HDL: 42 mg/dL (ref >39)
LDL Chol Calc (NIH): 69 mg/dL (ref 0–99)
LDL/HDL Ratio: 1.6 ratio (ref 0.0–3.6)
Triglycerides: 170 mg/dL — ABNORMAL HIGH (ref 0–149)
VLDL Cholesterol Cal: 29 mg/dL (ref 5–40)

## 2022-01-05 ENCOUNTER — Other Ambulatory Visit: Payer: Self-pay | Admitting: Cardiology

## 2022-01-05 DIAGNOSIS — Z794 Long term (current) use of insulin: Secondary | ICD-10-CM | POA: Diagnosis not present

## 2022-01-05 DIAGNOSIS — I129 Hypertensive chronic kidney disease with stage 1 through stage 4 chronic kidney disease, or unspecified chronic kidney disease: Secondary | ICD-10-CM | POA: Diagnosis not present

## 2022-01-05 DIAGNOSIS — E1129 Type 2 diabetes mellitus with other diabetic kidney complication: Secondary | ICD-10-CM | POA: Diagnosis not present

## 2022-01-05 DIAGNOSIS — I25810 Atherosclerosis of coronary artery bypass graft(s) without angina pectoris: Secondary | ICD-10-CM | POA: Diagnosis not present

## 2022-01-05 DIAGNOSIS — N184 Chronic kidney disease, stage 4 (severe): Secondary | ICD-10-CM | POA: Diagnosis not present

## 2022-01-10 ENCOUNTER — Other Ambulatory Visit: Payer: Self-pay

## 2022-01-10 MED ORDER — CARVEDILOL 6.25 MG PO TABS: 6.2500 mg | ORAL_TABLET | Freq: Every day | ORAL | 1 refills | Status: DC

## 2022-01-11 ENCOUNTER — Telehealth: Payer: Self-pay

## 2022-01-11 NOTE — Telephone Encounter (Signed)
12.5 mg twice daily

## 2022-01-11 NOTE — Telephone Encounter (Signed)
Patient called to say he received 6.25 mg daily of carvedilol but he thought you increased it 12.5 mg bid, can you confirm which dose he should be on?

## 2022-02-04 DIAGNOSIS — E1129 Type 2 diabetes mellitus with other diabetic kidney complication: Secondary | ICD-10-CM | POA: Diagnosis not present

## 2022-02-07 ENCOUNTER — Telehealth: Payer: Self-pay | Admitting: Cardiology

## 2022-02-07 ENCOUNTER — Other Ambulatory Visit: Payer: Self-pay

## 2022-02-07 MED ORDER — CARVEDILOL 12.5 MG PO TABS: 12.5000 mg | ORAL_TABLET | Freq: Two times a day (BID) | ORAL | 0 refills | Status: DC

## 2022-02-07 NOTE — Telephone Encounter (Signed)
Spoke to patient's wife medication has been refilled

## 2022-02-07 NOTE — Telephone Encounter (Signed)
Patient requesting refill for carvedilol, 90 day supply.

## 2022-02-25 ENCOUNTER — Other Ambulatory Visit: Payer: Self-pay | Admitting: Cardiology

## 2022-02-25 DIAGNOSIS — I10 Essential (primary) hypertension: Secondary | ICD-10-CM

## 2022-02-28 ENCOUNTER — Other Ambulatory Visit: Payer: Self-pay | Admitting: Cardiology

## 2022-02-28 DIAGNOSIS — E7800 Pure hypercholesterolemia, unspecified: Secondary | ICD-10-CM

## 2022-02-28 DIAGNOSIS — I10 Essential (primary) hypertension: Secondary | ICD-10-CM

## 2022-03-28 DIAGNOSIS — N1832 Chronic kidney disease, stage 3b: Secondary | ICD-10-CM | POA: Diagnosis not present

## 2022-04-04 ENCOUNTER — Other Ambulatory Visit: Payer: Self-pay | Admitting: Adult Health

## 2022-04-04 ENCOUNTER — Ambulatory Visit (HOSPITAL_COMMUNITY)
Admission: RE | Admit: 2022-04-04 | Discharge: 2022-04-04 | Disposition: A | Discharge status: Home / Self Care | Payer: Medicare PPO | Source: Ambulatory Visit | Attending: Adult Health | Admitting: Adult Health

## 2022-04-04 ENCOUNTER — Other Ambulatory Visit (HOSPITAL_COMMUNITY): Payer: Self-pay | Admitting: Adult Health

## 2022-04-04 DIAGNOSIS — R42 Dizziness and giddiness: Secondary | ICD-10-CM | POA: Diagnosis not present

## 2022-04-04 DIAGNOSIS — I6523 Occlusion and stenosis of bilateral carotid arteries: Secondary | ICD-10-CM | POA: Diagnosis not present

## 2022-04-04 DIAGNOSIS — R2689 Other abnormalities of gait and mobility: Secondary | ICD-10-CM | POA: Insufficient documentation

## 2022-04-04 DIAGNOSIS — H9192 Unspecified hearing loss, left ear: Secondary | ICD-10-CM | POA: Insufficient documentation

## 2022-04-04 DIAGNOSIS — R29818 Other symptoms and signs involving the nervous system: Secondary | ICD-10-CM | POA: Diagnosis not present

## 2022-04-04 DIAGNOSIS — H6121 Impacted cerumen, right ear: Secondary | ICD-10-CM | POA: Diagnosis not present

## 2022-04-04 DIAGNOSIS — H9120 Sudden idiopathic hearing loss, unspecified ear: Secondary | ICD-10-CM | POA: Diagnosis not present

## 2022-04-04 DIAGNOSIS — I129 Hypertensive chronic kidney disease with stage 1 through stage 4 chronic kidney disease, or unspecified chronic kidney disease: Secondary | ICD-10-CM | POA: Diagnosis not present

## 2022-04-05 ENCOUNTER — Other Ambulatory Visit: Payer: Self-pay | Admitting: Adult Health

## 2022-04-05 DIAGNOSIS — H9192 Unspecified hearing loss, left ear: Secondary | ICD-10-CM

## 2022-04-05 DIAGNOSIS — R29818 Other symptoms and signs involving the nervous system: Secondary | ICD-10-CM

## 2022-04-05 DIAGNOSIS — H9120 Sudden idiopathic hearing loss, unspecified ear: Secondary | ICD-10-CM

## 2022-04-05 DIAGNOSIS — R2689 Other abnormalities of gait and mobility: Secondary | ICD-10-CM

## 2022-04-07 DIAGNOSIS — N184 Chronic kidney disease, stage 4 (severe): Secondary | ICD-10-CM | POA: Diagnosis not present

## 2022-04-07 DIAGNOSIS — I129 Hypertensive chronic kidney disease with stage 1 through stage 4 chronic kidney disease, or unspecified chronic kidney disease: Secondary | ICD-10-CM | POA: Diagnosis not present

## 2022-04-07 DIAGNOSIS — E785 Hyperlipidemia, unspecified: Secondary | ICD-10-CM | POA: Diagnosis not present

## 2022-04-07 DIAGNOSIS — I10 Essential (primary) hypertension: Secondary | ICD-10-CM | POA: Diagnosis not present

## 2022-04-12 DIAGNOSIS — N1832 Chronic kidney disease, stage 3b: Secondary | ICD-10-CM | POA: Diagnosis not present

## 2022-04-12 DIAGNOSIS — N2581 Secondary hyperparathyroidism of renal origin: Secondary | ICD-10-CM | POA: Diagnosis not present

## 2022-04-12 DIAGNOSIS — M5136 Other intervertebral disc degeneration, lumbar region: Secondary | ICD-10-CM | POA: Diagnosis not present

## 2022-04-12 DIAGNOSIS — G894 Chronic pain syndrome: Secondary | ICD-10-CM | POA: Diagnosis not present

## 2022-04-12 DIAGNOSIS — I129 Hypertensive chronic kidney disease with stage 1 through stage 4 chronic kidney disease, or unspecified chronic kidney disease: Secondary | ICD-10-CM | POA: Diagnosis not present

## 2022-04-12 DIAGNOSIS — D631 Anemia in chronic kidney disease: Secondary | ICD-10-CM | POA: Diagnosis not present

## 2022-04-13 DIAGNOSIS — I25810 Atherosclerosis of coronary artery bypass graft(s) without angina pectoris: Secondary | ICD-10-CM | POA: Diagnosis not present

## 2022-04-13 DIAGNOSIS — N184 Chronic kidney disease, stage 4 (severe): Secondary | ICD-10-CM | POA: Diagnosis not present

## 2022-04-13 DIAGNOSIS — Z794 Long term (current) use of insulin: Secondary | ICD-10-CM | POA: Diagnosis not present

## 2022-04-13 DIAGNOSIS — I129 Hypertensive chronic kidney disease with stage 1 through stage 4 chronic kidney disease, or unspecified chronic kidney disease: Secondary | ICD-10-CM | POA: Diagnosis not present

## 2022-04-13 DIAGNOSIS — E1129 Type 2 diabetes mellitus with other diabetic kidney complication: Secondary | ICD-10-CM | POA: Diagnosis not present

## 2022-04-19 DIAGNOSIS — R2689 Other abnormalities of gait and mobility: Secondary | ICD-10-CM | POA: Diagnosis not present

## 2022-04-19 DIAGNOSIS — H903 Sensorineural hearing loss, bilateral: Secondary | ICD-10-CM | POA: Diagnosis not present

## 2022-04-19 DIAGNOSIS — H9122 Sudden idiopathic hearing loss, left ear: Secondary | ICD-10-CM | POA: Diagnosis not present

## 2022-04-25 DIAGNOSIS — E1129 Type 2 diabetes mellitus with other diabetic kidney complication: Secondary | ICD-10-CM | POA: Diagnosis not present

## 2022-05-11 ENCOUNTER — Other Ambulatory Visit: Payer: Self-pay | Admitting: Cardiology

## 2022-05-23 DIAGNOSIS — E1129 Type 2 diabetes mellitus with other diabetic kidney complication: Secondary | ICD-10-CM | POA: Diagnosis not present

## 2022-06-23 DIAGNOSIS — J449 Chronic obstructive pulmonary disease, unspecified: Secondary | ICD-10-CM | POA: Diagnosis not present

## 2022-06-23 DIAGNOSIS — E1142 Type 2 diabetes mellitus with diabetic polyneuropathy: Secondary | ICD-10-CM | POA: Diagnosis not present

## 2022-06-23 DIAGNOSIS — I252 Old myocardial infarction: Secondary | ICD-10-CM | POA: Diagnosis not present

## 2022-06-23 DIAGNOSIS — E785 Hyperlipidemia, unspecified: Secondary | ICD-10-CM | POA: Diagnosis not present

## 2022-06-23 DIAGNOSIS — E669 Obesity, unspecified: Secondary | ICD-10-CM | POA: Diagnosis not present

## 2022-06-23 DIAGNOSIS — Z794 Long term (current) use of insulin: Secondary | ICD-10-CM | POA: Diagnosis not present

## 2022-06-23 DIAGNOSIS — I10 Essential (primary) hypertension: Secondary | ICD-10-CM | POA: Diagnosis not present

## 2022-06-23 DIAGNOSIS — I25119 Atherosclerotic heart disease of native coronary artery with unspecified angina pectoris: Secondary | ICD-10-CM | POA: Diagnosis not present

## 2022-06-23 DIAGNOSIS — E113599 Type 2 diabetes mellitus with proliferative diabetic retinopathy without macular edema, unspecified eye: Secondary | ICD-10-CM | POA: Diagnosis not present

## 2022-06-28 ENCOUNTER — Ambulatory Visit: Payer: Medicare PPO

## 2022-06-28 ENCOUNTER — Other Ambulatory Visit: Payer: Self-pay

## 2022-06-28 ENCOUNTER — Telehealth: Payer: Self-pay | Admitting: Cardiology

## 2022-06-28 DIAGNOSIS — I6523 Occlusion and stenosis of bilateral carotid arteries: Secondary | ICD-10-CM

## 2022-06-28 MED ORDER — NITROGLYCERIN 0.4 MG SL SUBL: 0.4000 mg | SUBLINGUAL_TABLET | SUBLINGUAL | 0 refills | Status: AC | PRN

## 2022-06-28 NOTE — Telephone Encounter (Signed)
Ok done it was send to the [facility]

## 2022-06-28 NOTE — Telephone Encounter (Signed)
Patient here for an appt today, asked if nitroglycerin prescription can be refilled.

## 2022-07-07 ENCOUNTER — Encounter: Payer: Self-pay | Admitting: Cardiology

## 2022-07-07 ENCOUNTER — Ambulatory Visit: Payer: Medicare PPO | Admitting: Cardiology

## 2022-07-07 VITALS — BP 136/69 | HR 51 | Temp 97.9°F | Resp 16 | Ht 66.0 in | Wt 184.0 lb

## 2022-07-07 DIAGNOSIS — E7800 Pure hypercholesterolemia, unspecified: Secondary | ICD-10-CM

## 2022-07-07 DIAGNOSIS — I25118 Atherosclerotic heart disease of native coronary artery with other forms of angina pectoris: Secondary | ICD-10-CM | POA: Diagnosis not present

## 2022-07-07 DIAGNOSIS — I6523 Occlusion and stenosis of bilateral carotid arteries: Secondary | ICD-10-CM | POA: Diagnosis not present

## 2022-07-07 DIAGNOSIS — N184 Chronic kidney disease, stage 4 (severe): Secondary | ICD-10-CM

## 2022-07-07 DIAGNOSIS — R2681 Unsteadiness on feet: Secondary | ICD-10-CM

## 2022-07-07 NOTE — Progress Notes (Signed)
Primary Physician/Referring:  Ginger Organ., MD  Patient ID: Allen Medina, male    DOB: 1941-11-27, 81 y.o.   MRN: [id number]   Chief Complaint  Patient presents with   Coronary Artery Disease   Hyperlipidemia   Follow-up    6 month   Dizziness    HPI   Allen Medina  is a 81 y.o. Caucasian male with type 2 diabetes, stage IV CKD being followed by Dr. Elmarie Shiley, diabetic peripheral neuropathy, diabetic retinopathy, hypertension, hyperlipidemia, chronic back pain and DJD and asymptomatic bilateral carotid stenosis, CAD and S/P CABG 1991 followed by redo CABG in 2003 due to occluded bypass grafts except LIMA to LAD,  MI in 2006 needing stent to SVG to RCA, acute inferior and lateral myocardial infarction on 11/13/2019, and had stenting to SVG to OM 2 and also to native distal OM 2 with implantation of 2 overlapping DES in [place].  He presents for 80-monthoffice visit, he had sudden onset of left ear hearing loss, has been evaluated by ENT.  Since that episode, he has had gait instability and has to hold onto something to walk hence has reduced his physical activity.  He has not used any sublingual nitroglycerin.  Past Medical History:  Diagnosis Date   Arthritis    "hands" (04/16/2014)   Blindness of left eye    CAD (coronary artery disease) of bypass graft 2000   Occluded grafts --> redo CABG   CAD, multiple vessel 1991   last cath 2006-patent grafts, last nuc 09/10/09-no ischemia   Chronic lower back pain    CKD (chronic kidney disease) stage 4, GFR 15-29 ml/min (HCC)    kidney stones,cmet 02/09/12, 02/13/12 on chart   Claudication ([facility]    LE dopplers 03/17/10, normal ABIs, normal pressures   Diabetes mellitus type 2 with complications ([place]    renal insuff, HTN, CAD, PVD   History of blood transfusion 2010   "after back OR" (04/16/2014)   HTN (hypertension)    difficult to control   Hx of myocardial infarction    Hyperlipidemia LDL goal < 100    Kidney  stones    [place]ocardial [place]   LOV with EKG Dr LRex Kras7/12 on chart, eccho 10/03, stress test 9/10 on chart, chest x ray 2/13 EPIC, chest CT 02/13/12 on chart   Neuropathy in diabetes ([facility]    Peripheral vascular disease ([facility]    peripheral neuropathy, claudication   Pneumonia    hx of   S/P CABG x 4 2000   SVG-OM1, SVG-OM 2-OM 3, SVG-diagonal, free RIMA-RPDA ( has stent in the vessel from 2003)-   S/P CABG x 5    only LIMA-LAD still patent   Spinal stenosis    Thin skin    Vitamin B 12 deficiency    Past Surgical History:  Procedure Laterality Date   ANTERIOR LAT LUMBAR FUSION N/A 04/16/2014   Procedure: Lateral  Lumbar Two-Three/Three-Four Interbody and Fusion with Lateral Pedicle Screw Fixation Lumbar Two-Four;  Surgeon: DMelina Schools MD;  [facility]  Service: Orthopedics;  Laterality: N/A;   BACK SURGERY  2009, 2010   CARDIAC CATHETERIZATION  06/04/1990   severe 3 vessel dz   CARDIAC CATHETERIZATION  01/13/97   patent grafts, nl LV function   CARDIAC CATHETERIZATION  01/28/1999   severe native CAD with disease involving all 3 SVG, recommend redo CABG   CARDIAC CATHETERIZATION  05/16/2002   patent graft except of  mod to sever stenosis in the midprotion of the RIMA to the posterior descending artery   CARDIAC CATHETERIZATION  08/28/2003   nl lv fxn, patent grafts to all vessels with small vessel disease   CARDIAC CATHETERIZATION  07/07/2005   patent grafts, EF 45-50%   CARPAL TUNNEL RELEASE Right    CATARACT EXTRACTION W/ INTRAOCULAR LENS  IMPLANT, BILATERAL     CHOLECYSTECTOMY     COLONOSCOPY N/A 03/31/2017   Procedure: COLONOSCOPY;  Surgeon: Carol Ada, MD;  Location: [facility];  Service: Endoscopy;  Laterality: N/A;   CORONARY ANGIOPLASTY WITH STENT PLACEMENT  05/22/2002   3.0x72m cypher stent covered the proximal and distal portion of RIMA, final inflation -16x60   CORONARY ARTERY BYPASS GRAFT  1991 & 2000   only LIMA-LAD patent   CYSTOSCOPY W/  STONE MANIPULATION  2013   CYSTOSCOPY/RETROGRADE/URETEROSCOPY/STONE EXTRACTION WITH BASKET  02/16/2012   Procedure: CYSTOSCOPY/RETROGRADE/URETEROSCOPY/STONE EXTRACTION WITH BASKET;  Surgeon: JMalka So MD;  Location: [facility];  Service: Urology;  Laterality: Left;  Left Ureterscopy with Stone Extraction   DOPPLER ECHOCARDIOGRAPHY  07/09/2013   EF 65-78 (up from 50-55% in 2013), minimally increased transvalvular velocity across the aortic valve suggestive of very mild stenosis to simply sclerosis. Normal diastolic indices   DOPPLER ECHOCARDIOGRAPHY     LATERAL FUSION LUMBAR SPINE  04/16/2014   NM MYOVIEW LTD  02/2013   no evidence of ischemia or infarct, septal hypokinesis/dyskinesis    REDO - CORONARY ARTERY BYPASS GRAFT  2000    previous LIMA to LAD patent; seqSVG - OM 2, OM 3, SVG-D3, SVG-OM1, fRIMA-rPDA   TONSILLECTOMY     Family History  Problem Relation Age of Onset   Heart disease Father    Diabetes Father    Heart attack Father    Arthritis-Osteo Sister    Arthritis-Osteo Sister     Social History   Tobacco Use   Smoking status: Never   Smokeless tobacco: Never  Substance Use Topics   Alcohol use: No   Marital Status: Married  ROS:   Review of Systems  Constitutional: Negative.  HENT:  Positive for hearing loss (left).   Cardiovascular:  Positive for chest pain (Stable), dyspnea on exertion (Stable) and leg swelling (Occasional). Negative for claudication.  Musculoskeletal:  Positive for back pain.  Gastrointestinal:  Negative for melena.  Neurological:  Positive for disturbances in coordination.   Objective  Blood pressure 136/69, pulse (!) 51, temperature 97.9 F (36.6 C), resp. rate 16, height 5' 6"  (1.676 m), weight 184 lb (83.5 kg), SpO2 96 %. Body mass index is 29.7 kg/m.      07/07/2022    8:58 AM 12/24/2021    8:38 AM 12/24/2021    8:33 AM  Vitals with BMI  Height 5' 6"   5' 6"   Weight 184 lbs  183 lbs  BMI 290.30 209.23 Systolic [id number] Diastolic  69 74 73  Pulse 51 68 69    Orthostatic VS for the past 72 hrs (Last 3 readings):  Orthostatic BP Patient Position BP Location Cuff Size Orthostatic Pulse  07/07/22 0908 130/56 Standing Left Arm Normal 53  07/07/22 0907 138/64 Sitting Left Arm Normal 51  07/07/22 0906 143/64 Supine Left Arm Normal 51    Physical Exam Constitutional:      Appearance: He is well-developed.     Comments: Mildly obese  Neck:     Vascular: No JVD.  Cardiovascular:     Rate and Rhythm: Normal rate  and regular rhythm.     Pulses: Intact distal pulses.          Carotid pulses are  on the right side with bruit.      Femoral pulses are 2+ on the right side and 2+ on the left side.      Popliteal pulses are 2+ on the right side and 2+ on the left side.       Dorsalis pedis pulses are 0 on the right side and 0 on the left side.       Posterior tibial pulses are 0 on the right side and 0 on the left side.     Heart sounds: Normal heart sounds. No murmur heard.    No gallop.  Pulmonary:     Effort: Pulmonary effort is normal.     Breath sounds: Normal breath sounds.  Abdominal:     General: Bowel sounds are normal.     Palpations: Abdomen is soft.  Musculoskeletal:        General: No swelling.     Right lower leg: Edema (1-2+ bilateral pitting pedal edema) present.     Left lower leg: Edema (1-2+ bilateral pitting pedal edema) present.    Laboratory examination:   Lab Results  Component Value Date   NA 139 12/24/2021   K 4.6 12/24/2021   CO2 23 12/24/2021   GLUCOSE 203 (H) 12/24/2021   BUN 45 (H) 12/24/2021   CREATININE 2.60 (H) 12/24/2021   CALCIUM 9.4 12/24/2021   EGFR 24 (L) 12/24/2021   GFRNONAA 18 (L) 01/11/2020       Latest Ref Rng & Units 12/24/2021    1:47 PM 01/11/2020    6:09 AM 01/10/2020    5:19 AM  CMP  Glucose 70 - 99 mg/dL 203  83  129   BUN 8 - 27 mg/dL 45  51  64   Creatinine 0.76 - 1.27 mg/dL 2.60  3.20  3.24   Sodium 134 - 144 mmol/L 139  139  138   Potassium 3.5 - 5.2  mmol/L 4.6  4.8  4.4   Chloride 96 - 106 mmol/L 97  108  105   CO2 20 - 29 mmol/L 23  22  22    Calcium 8.6 - 10.2 mg/dL 9.4  9.0  8.4   Total Protein 6.0 - 8.5 g/dL 8.0     Total Bilirubin 0.0 - 1.2 mg/dL 0.5     Alkaline Phos 44 - 121 IU/L 149     AST 0 - 40 IU/L 24     ALT 0 - 44 IU/L 16         Latest Ref Rng & Units 01/11/2020    6:09 AM 01/10/2020    5:19 AM 01/09/2020    8:31 AM  CBC  WBC 4.0 - 10.5 K/uL 7.1  8.2  9.3   Hemoglobin 13.0 - 17.0 g/dL 11.0  10.8  11.8   Hematocrit 39.0 - 52.0 % 33.2  32.8  36.5   Platelets 150 - 400 K/uL 175  173  203    Lipid Panel Recent Labs    12/24/21 1347  CHOL 140  TRIG 170*  LDLCALC 69  HDL 42    HEMOGLOBIN A1C Lab Results  Component Value Date   HGBA1C 7.3 (H) 12/24/2021   MPG 128.37 01/09/2020   External Labs:  A1C 6.600 % 04/13/2022  Hemoglobin 13.500 G/ 03/28/2022  Creatinine, Serum 1.770 MG/ 03/28/2022  Potassium 4.800 mEq 04/04/2022 Magnesium 1.800  MG/ 09/05/2016 ALT (SGPT) 153.000 IU 04/04/2022   Medication and allergies:   No Known Allergies   Current Outpatient Medications:    albuterol (VENTOLIN HFA) 108 (90 Base) MCG/ACT inhaler, Inhale 2 puffs into the lungs every 6 (six) hours as needed for wheezing or shortness of breath., Disp: , Rfl:    amLODipine (NORVASC) 10 MG tablet, TAKE (1) TABLET BY MOUTH ONCE DAILY., Disp: 90 tablet, Rfl: 0   aspirin (ASPIRIN CHILDRENS) 81 MG chewable tablet, Chew 1 tablet (81 mg total) by mouth daily., Disp: , Rfl:    carvedilol (COREG) 12.5 MG tablet, TAKE 1 TABLET BY MOUTH TWICE DAILY WITH MEALS., Disp: 180 tablet, Rfl: 0   Cholecalciferol (VITAMIN D3) 5000 units CAPS, Take 5,000 Units by mouth daily., Disp: , Rfl:    cyanocobalamin (,VITAMIN B-12,) 1000 MCG/ML injection, Inject 1,000 mcg into the muscle every 30 (thirty) days. , Disp: , Rfl:    Dulaglutide 1.5 MG/0.5ML SOPN, Inject 1.5 mg into the skin every Saturday. TRULICITY, Disp: , Rfl:    Febuxostat 80 MG TABS, Take 80 mg  by mouth daily., Disp: , Rfl:    furosemide (LASIX) 40 MG tablet, Take 40 mg by mouth daily., Disp: , Rfl:    HYDROcodone-acetaminophen (NORCO/VICODIN) 5-325 MG tablet, Take 1 tablet by mouth every 6 (six) hours as needed., Disp: 14 tablet, Rfl: 0   Insulin Aspart FlexPen 100 UNIT/ML SOPN, Inject 10-16 mLs as directed 2 (two) times daily., Disp: , Rfl:    Insulin Glargine 300 UNIT/ML SOPN, Inject 24-26 Units into the skin daily. TOUJEO, Disp: , Rfl:    insulin glargine, 2 Unit Dial, (TOUJEO MAX SOLOSTAR) 300 UNIT/ML Solostar Pen, Inject into the skin., Disp: , Rfl:    nitroGLYCERIN (NITROSTAT) 0.4 MG SL tablet, Place 1 tablet (0.4 mg total) under the tongue every 5 (five) minutes as needed for chest pain. DISSOLVE 1 TABLET UNDER TONGUE EVERY 5 MINUTES UP TO 15 MIN FOR CHEST PAIN. IF NO RELIEF CALL 911, Disp: 25 tablet, Rfl: 0   pregabalin (LYRICA) 75 MG capsule, Take 75 mg by mouth 3 (three) times daily., Disp: , Rfl:    rosuvastatin (CRESTOR) 10 MG tablet, Take 10 mg by mouth daily., Disp: , Rfl:    tadalafil (CIALIS) 5 MG tablet, Take 5 mg by mouth at bedtime. , Disp: , Rfl:    zolpidem (AMBIEN) 10 MG tablet, Take 10 mg by mouth at bedtime., Disp: , Rfl:    JARDIANCE 10 MG TABS tablet, Take 10 mg by mouth daily., Disp: , Rfl:   Radiology:   CX-ray 01/09/2020 portable Multiple scratch that sternotomy scar noted.  Heart size within normal limits.  No focal airspace consolidation, pleural effusion or pneumothorax.  Cardiac Studies:   ABI 12/29/2017: Unable to obtain due to medial calcinosis.  Bilateral toe brachial indices are normal.  Coronary angi[facility] 11/13/2019 at [facility] [place]: Distal left main 80% stenosed, LAD occluded proximally, LIMA to LAD patent.  Left circumflex is small.  SVG to OM 2-3 has large thrombus burden and is occluded just before OM 3.  S/P thrombectomy followed by 3.5 x 38 mm Synergy and distally 2.25 x 12 mm Synergy DES. RCA is  codominant.  There are tandem 80% stenosis in the mid and distal vessel.  Right PDA is chronically occluded.  SVG to RCA has a 70% proximal stenosis prior to a widely patent stent in the midportion of the graft.  Distal to the stent 80% tandem  stenosis with good runoff.  Echocardiogram 05/13/2021: Left ventricle cavity is normal in size and wall thickness. Abnormal septal wall motion due to post-operative coronary artery bypass graft. Normal LV systolic function with EF 56%. Doppler evidence of grade II (pseudonormal) diastolic dysfunction, elevated LAP. Mild (Grade I) mitral regurgitation. Mild tricuspid regurgitation. No evidence of pulmonary hypertension. No significant change compared to previous study in 2019.  Lexiscan Tetrofosmin stress test 05/24/2021: Lexiscan nuclear stress test performed using 1-day protocol. SPECT images show moderate sized, medium intensity, predominantly fixed perfusion defect in mid to apical inferior/inferolateral myocardium with minimal inferior reversibility, along with mid to apical inferior/inferolateral hypokinesis. Stress LVEF 44%. High risk study.  Carotid artery duplex 06/28/2022: Duplex suggests stenosis in the right internal carotid artery (16-49%), upper limit of the spectrum. Duplex suggests stenosis in the left internal carotid artery (1-15%). Antegrade right vertebral artery flow. Antegrade left vertebral artery flow. Compared to the study done on 10/18/2021, left ICA stenosis of 50 to 69% is now <50%.   Follow up in one year is appropriate if clinically indicated.  EKG   EKG 07/15/2022: Marked sinus bradycardia at rate of 50 bpm.  Nonspecific ST-T abnormality high lateral leads.  Normal QT interval.  Compared to 12/24/2021, right bundle branch block is not present.  Present EKG similar to 05/12/2021.   Assessment     ICD-10-CM   1. Coronary artery disease of native artery of native heart with stable angina pectoris ([place])  I25.118 EKG 12-Lead    2.  Asymptomatic bilateral carotid artery stenosis  I65.23     3. CKD (chronic kidney disease) stage 4, GFR 15-29 ml/min (HCC)  N18.4     4. Hypercholesteremia  E78.00     5. Gait instability  R26.81 Ambulatory referral to Neurology      No orders of the defined types were placed in this encounter.  Orders Placed This Encounter  Procedures   Ambulatory referral to Neurology    Referral Priority:   Routine    Referral Type:   Consultation    Referral Reason:   Specialty Services Required    Requested Specialty:   Neurology    Number of Visits Requested:   1   EKG 12-Lead    Medications Discontinued During This Encounter  Medication Reason   omeprazole (PRILOSEC) 40 MG capsule    Melatonin 5 MG SUBL     Recommendations:   Allen Medina  is a 81 y.o. Caucasian male with type 2 diabetes, stage IV CKD being followed by Dr. Elmarie Shiley, diabetic peripheral neuropathy, diabetic retinopathy, hypertension, hyperlipidemia, chronic back pain and DJD and asymptomatic bilateral carotid stenosis, CAD and S/P CABG 1991 followed by redo CABG in 2003 due to occluded bypass grafts except LIMA to LAD,  MI in 2006 needing stent to SVG to RCA, acute inferior and lateral myocardial infarction on 11/13/2019, and had stenting to SVG to OM 2 and also to native distal OM 2 with implantation of 2 overlapping DES in [place].  He presents for 74-monthoffice visit, he had sudden onset of left ear hearing loss, has been evaluated by ENT.  Since that episode, he has had gait instability and has to hold onto something to walk hence has reduced his physical activity.  I would like to refer him to neurology as MRI of the brain had revealed peripheral hemorrhagic changes suggestive of amyloid.  From cardiac standpoint he has not had any recurrence of angina pectoris.  No clinical evidence of  heart failure.  No change in chronic bilateral pitting edema.  I reviewed his external labs, stage IV chronic kidney disease  has remained stable.  Carotid duplex discussed with the patient, stable carotids in fact has improvement in carotid artery stenosis, lipids are under excellent control, will reschedule his screening to be done in a year on his next office visit in 6 months.  Blood pressure is well controlled.  He is not orthostatic today.Adrian Prows, MD, [facility] 07/07/2022, 9:34 AM Office: [phone] Pager: [phone]

## 2022-07-13 ENCOUNTER — Encounter: Payer: Self-pay | Admitting: Neurology

## 2022-07-18 NOTE — Progress Notes (Unsigned)
NEUROLOGY CONSULTATION NOTE  Allen Medina MRN: [id number] DOB: 1941-11-24  Referring provider: Adrian Prows, MD Primary care provider: Ginger Organ, MD  Reason for consult:  gait instability  Assessment/Plan:   Suspect left-sided labyrinthitis - unfortunately, symptoms may be chronic. Cerebral amyloid angiopathy - incidental finding  1  He has follow up with ENT later this week.  I recommended that he agree to vestibular rehab and consider the steroid injections 2  I explained that cerebral amyloid angiopathy increases risk of bleeding in brain.  Given his cardiac comorbidities, I think he should remain on ASA '81mg'$  daily, however I probably wouldn't change to Plavix. 3  Follow up as needed.   Subjective:  Allen Medina is a 81 year old male with CAD s/p CABG, HTN, HLD, DM II, CKD stage IV, chronic back pain who presents for gait instability.  History supplemented by referring provider's note.  He is accompanied by his wife.    In April, he developed sudden onset profound left-sided hearing loss.  Since then, he has started to stagger, usually to the left.  If he turns quickly, he will continue staggering.  No tinnitus or ear pain.  He had COVID about a month prior to onset.  MRI of brain with and without contrast on 04/04/2022 personally reviewed showed mild chronic small vessel ischemic changes and scattered microhemorrhages in the predominantly peripheral cerebral hemispheres concerning for cerebral amyloid angiopathy but no acute intracranial or IAC abnormalities.  Followed up with ENT who prescribed 15 day course of high dose prednisone which was ineffective and he had to stop prematurely due to elevated glucose levels.  She had also recommended PT and steroid injections which he had declined.  Denies memory deficits.     PAST MEDICAL HISTORY: Past Medical History:  Diagnosis Date   Arthritis    "hands" (04/16/2014)   Blindness of left eye    CAD (coronary artery  disease) of bypass graft 2000   Occluded grafts --> redo CABG   CAD, multiple vessel 1991   last cath 2006-patent grafts, last nuc 09/10/09-no ischemia   Chronic lower back pain    CKD (chronic kidney disease) stage 4, GFR 15-29 ml/min (HCC)    kidney stones,cmet 02/09/12, 02/13/12 on chart   Claudication ([place])    LE dopplers 03/17/10, normal ABIs, normal pressures   Diabetes mellitus type 2 with complications ([place])    renal insuff, HTN, CAD, PVD   History of blood transfusion 2010   "after back OR" (04/16/2014)   HTN (hypertension)    difficult to control   Hx of myocardial infarction    Hyperlipidemia LDL goal < 100    Kidney stones    Myocardial infarction ([place]) 2000   LOV with EKG Dr Rex Kras 7/12 on chart, eccho 10/03, stress test 9/10 on chart, chest x ray 2/13 EPIC, chest CT 02/13/12 on chart   Neuropathy in diabetes [facility])    Peripheral vascular disease ([place])    peripheral neuropathy, claudication   Pneumonia    hx of   S/P CABG x 4 2000   SVG-OM1, SVG-OM 2-OM 3, SVG-diagonal, free RIMA-RPDA ( has stent in the vessel from 2003)-   S/P CABG x 5    only LIMA-LAD still patent   Spinal stenosis    Thin skin    Vitamin B 12 deficiency     PAST SURGICAL HISTORY: Past Surgical History:  Procedure Laterality Date   ANTERIOR LAT LUMBAR FUSION N/A 04/16/2014  Procedure: Lateral  Lumbar Two-Three/Three-Four Interbody and Fusion with Lateral Pedicle Screw Fixation Lumbar Two-Four;  Surgeon: Melina Schools, MD;  Location: [facility];  Service: Orthopedics;  Laterality: N/A;   BACK SURGERY  2009, 2010   CARDIAC CATHETERIZATION  06/04/1990   severe 3 vessel dz   CARDIAC CATHETERIZATION  01/13/97   patent grafts, nl LV function   CARDIAC CATHETERIZATION  01/28/1999   severe native CAD with disease involving all 3 SVG, recommend redo CABG   CARDIAC CATHETERIZATION  05/16/2002   patent graft except of mod to sever stenosis in the midprotion of the RIMA to the posterior descending artery    CARDIAC CATHETERIZATION  08/28/2003   nl lv fxn, patent grafts to all vessels with small vessel disease   CARDIAC CATHETERIZATION  07/07/2005   patent grafts, EF 45-50%   CARPAL TUNNEL RELEASE Right    CATARACT EXTRACTION W/ INTRAOCULAR LENS  IMPLANT, BILATERAL     CHOLECYSTECTOMY     COLONOSCOPY N/A 03/31/2017   Procedure: COLONOSCOPY;  Surgeon: Carol Ada, MD;  Location: [facility];  Service: Endoscopy;  Laterality: N/A;   CORONARY ANGIOPLASTY WITH STENT PLACEMENT  05/22/2002   3.0x21m cypher stent covered the proximal and distal portion of RIMA, final inflation -16x60   CORONARY ARTERY BYPASS GRAFT  1991 & 2000   only LIMA-LAD patent   CYSTOSCOPY W/ STONE MANIPULATION  2013   CYSTOSCOPY/RETROGRADE/URETEROSCOPY/STONE EXTRACTION WITH BASKET  02/16/2012   Procedure: CYSTOSCOPY/RETROGRADE/URETEROSCOPY/STONE EXTRACTION WITH BASKET;  Surgeon: JMalka So MD;  Location: [facility];  Service: Urology;  Laterality: Left;  Left Ureterscopy with Stone Extraction   DOPPLER ECHOCARDIOGRAPHY  07/09/2013   EF 65-78 (up from 50-55% in 2013), minimally increased transvalvular velocity across the aortic valve suggestive of very mild stenosis to simply sclerosis. Normal diastolic indices   DOPPLER ECHOCARDIOGRAPHY     LATERAL FUSION LUMBAR SPINE  04/16/2014   NM MYOVIEW LTD  02/2013   no evidence of ischemia or infarct, septal hypokinesis/dyskinesis    REDO - CORONARY ARTERY BYPASS GRAFT  2000    previous LIMA to LAD patent; seqSVG - OM 2, OM 3, SVG-D3, SVG-OM1, fRIMA-rPDA   TONSILLECTOMY      MEDICATIONS: Current Outpatient Medications on File Prior to Visit  Medication Sig Dispense Refill   albuterol (VENTOLIN HFA) 108 (90 Base) MCG/ACT inhaler Inhale 2 puffs into the lungs every 6 (six) hours as needed for wheezing or shortness of breath.     amLODipine (NORVASC) 10 MG tablet TAKE (1) TABLET BY MOUTH ONCE DAILY. 90 tablet 0   aspirin (ASPIRIN CHILDRENS) 81 MG chewable tablet Chew 1 tablet (81 mg  total) by mouth daily.     carvedilol (COREG) 12.5 MG tablet TAKE 1 TABLET BY MOUTH TWICE DAILY WITH MEALS. 180 tablet 0   Cholecalciferol (VITAMIN D3) 5000 units CAPS Take 5,000 Units by mouth daily.     cyanocobalamin (,VITAMIN B-12,) 1000 MCG/ML injection Inject 1,000 mcg into the muscle every 30 (thirty) days.      Dulaglutide 1.5 MG/0.5ML SOPN Inject 1.5 mg into the skin every Saturday. TRULICITY     Febuxostat 80 MG TABS Take 80 mg by mouth daily.     furosemide (LASIX) 40 MG tablet Take 40 mg by mouth daily.     HYDROcodone-acetaminophen (NORCO/VICODIN) 5-325 MG tablet Take 1 tablet by mouth every 6 (six) hours as needed. 14 tablet 0   Insulin Aspart FlexPen 100 UNIT/ML SOPN Inject 10-16 mLs as directed 2 (two) times daily.  insulin glargine, 2 Unit Dial, (TOUJEO MAX SOLOSTAR) 300 UNIT/ML Solostar Pen Inject into the skin.     JARDIANCE 10 MG TABS tablet Take 10 mg by mouth daily.     nitroGLYCERIN (NITROSTAT) 0.4 MG SL tablet Place 1 tablet (0.4 mg total) under the tongue every 5 (five) minutes as needed for chest pain. DISSOLVE 1 TABLET UNDER TONGUE EVERY 5 MINUTES UP TO 15 MIN FOR CHEST PAIN. IF NO RELIEF CALL 911 25 tablet 0   pregabalin (LYRICA) 75 MG capsule Take 75 mg by mouth 3 (three) times daily.     rosuvastatin (CRESTOR) 10 MG tablet Take 10 mg by mouth daily.     tadalafil (CIALIS) 5 MG tablet Take 5 mg by mouth at bedtime.      zolpidem (AMBIEN) 10 MG tablet Take 10 mg by mouth at bedtime.     No current facility-administered medications on file prior to visit.     ALLERGIES: No Known Allergies  FAMILY HISTORY: Family History  Problem Relation Age of Onset   Heart disease Father    Diabetes Father    Heart attack Father    Arthritis-Osteo Sister    Arthritis-Osteo Sister     Objective:  Blood pressure (!) 110/50, pulse (!) 50, height '5\' 5"'$  (1.651 m), weight 192 lb (87.1 kg), SpO2 94 %. General: No acute distress.  Patient appears well-groomed.   Head:   Normocephalic/atraumatic Eyes:  fundi examined but not visualized Neck: supple, no paraspinal tenderness, full range of motion Back: No paraspinal tenderness Heart: regular rate and rhythm Lungs: Clear to auscultation bilaterally. Vascular: No carotid bruits. Neurological Exam: Mental status: alert and oriented to person, place, and time, speech fluent and not dysarthric, language intact. Cranial nerves: CN I: not tested CN II: pupils equal, round and reactive to light, visual fields intact CN III, IV, VI:  full range of motion, no nystagmus, no ptosis CN V: facial sensation intact. CN VII: upper and lower face symmetric CN VIII: hearing intact CN IX, X: gag intact, uvula midline CN XI: sternocleidomastoid and trapezius muscles intact CN XII: tongue midline Bulk & Tone: normal, no fasciculations. Motor:  muscle strength 5/5 throughout Sensation:  Pinprick sensation reduced in the left foot, vibratory sensation reduced in both feet Deep Tendon Reflexes:  2+ throughout,  toes downgoing.   Finger to nose testing:  Without dysmetria.   Heel to shin:  Without dysmetria.   Gait:  Mildly ataxic, staggers.  Unable to tandem walk.  Romberg positive.    Thank you for allowing me to take part in the care of this patient.  Metta Clines, DO  CC:  Adrian Prows, MD  Ginger Organ, MD

## 2022-07-19 ENCOUNTER — Ambulatory Visit: Payer: Medicare PPO | Admitting: Neurology

## 2022-07-19 ENCOUNTER — Encounter: Payer: Self-pay | Admitting: Neurology

## 2022-07-19 VITALS — BP 110/50 | HR 50 | Ht 65.0 in | Wt 192.0 lb

## 2022-07-19 DIAGNOSIS — H8302 Labyrinthitis, left ear: Secondary | ICD-10-CM | POA: Diagnosis not present

## 2022-07-19 DIAGNOSIS — I680 Cerebral amyloid angiopathy: Secondary | ICD-10-CM

## 2022-07-19 NOTE — Patient Instructions (Signed)
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  Unfortunately, it may be a chronic condition.  Consider injections recommended by ENT.  She also recommended physical therapy (for vestibular rehab).  I would consider doing that.   MRI of brain showed evidence of tiny old microbleeds which may indicated increased risk for bleeding.  I think it is okay to continue aspirin '81mg'$  due to your heart risk factors but I would not increase to a stronger blood thinner such as Plavix  Labyrinthitis  Labyrinthitis is an infection of the inner ear. Your inner ear is made up of tubes and canals (labyrinth). These are filled with fluid. There are nerve cells in your inner ear that send hearing and balance signals to your brain. When germs get inside the tubes and canals, they harm the nerve cells that send signals to the brain. This condition often starts all of a sudden and goes away in a few weeks with treatment. If the infection harms parts of the tubes and canals, some symptoms may last for a long time. What are the causes? This condition can be caused by viruses, such as one that causes: Mononucleosis, also called mono. Measles or mumps. The flu. Herpes. This condition can also be caused by bacteria that spread from an infection in the brain or the middle ear. What increases the risk? You may be at greater risk for this condition if: You had a mouth, nose, throat, or ear infection not long ago. You drink a lot of alcohol. You smoke. You use certain drugs. You are feeling tired (fatigued). You have a lot of stress. You have allergies. What are the signs or symptoms? Symptoms of this condition often start all of a sudden. The symptoms may range from mild to very bad, and may include: Dizziness. Hearing loss. A feeling that you or the things around you are moving when they are not (vertigo). Ringing in your ear  (tinnitus). A feeling like you may vomit (nausea). Vomiting. Trouble focusing your eyes. If you have symptoms that last a long time, they may include: Feeling tired. Confusion. Hearing loss. Ringing in your ear. Poor balance. A feeling that you or the things around you are moving when they are not if you move your head suddenly. How is this treated? Treatment depends on the cause. If your condition is caused by bacteria, you may need antibiotic medicine. If it is caused by a virus, it may get better on its own. No matter the cause, you may be treated with: Medicines to: Stop dizziness. Stop the feeling that you may vomit. Reduce irritation and swelling. Get better faster. Fluids through an IV tube. These may be given at a hospital. You may need IV fluids if you have very bad vomiting or feelings like you may vomit. Physical therapy. A therapist can teach you exercises to help you get used to feeling dizzy. You may need this if you have dizziness that does not go away. Follow these instructions at home: Medicines Take over-the-counter and prescription medicines only as told by your doctor. If you were prescribed an antibiotic medicine, take it as told by your doctor. Do not stop taking it even if you start to feel better. Activity Rest as told by your doctor. Limit the things you do as told. Return to your normal activities when your doctor says that it is safe. Do not make any sudden movements  until you no longer feel dizzy. Do exercises as told by your doctor. General instructions Avoid loud noises and bright lights. Do not drive until your doctor says that it is safe for you. Drink enough fluid to keep your pee (urine) pale yellow. Keep all follow-up visits. Contact a doctor if: Your symptoms do not get better with medicine. You do not get better after 2 weeks. You have a fever. Get help right away if: You keep feeling like you may vomit. You keep vomiting. You are very  dizzy. Your hearing gets much worse all of a sudden. Summary Labyrinthitis is an infection of the inner ear. This condition is often caused by a virus. Symptoms include dizziness, hearing loss, and ringing in the ears. Treatment depends on the cause. Do what your doctor tells you. This information is not intended to replace advice given to you by your health care provider. Make sure you discuss any questions you have with your health care provider. Document Revised: 01/13/2021 Document Reviewed: 01/13/2021 Elsevier Patient Education  [place].

## 2022-07-21 DIAGNOSIS — H903 Sensorineural hearing loss, bilateral: Secondary | ICD-10-CM | POA: Diagnosis not present

## 2022-07-21 DIAGNOSIS — H73893 Other specified disorders of tympanic membrane, bilateral: Secondary | ICD-10-CM | POA: Diagnosis not present

## 2022-07-22 DIAGNOSIS — H9122 Sudden idiopathic hearing loss, left ear: Secondary | ICD-10-CM | POA: Diagnosis not present

## 2022-08-02 ENCOUNTER — Encounter (INDEPENDENT_AMBULATORY_CARE_PROVIDER_SITE_OTHER): Payer: Medicare PPO | Admitting: Ophthalmology

## 2022-08-02 DIAGNOSIS — I25810 Atherosclerosis of coronary artery bypass graft(s) without angina pectoris: Secondary | ICD-10-CM | POA: Diagnosis not present

## 2022-08-02 DIAGNOSIS — I10 Essential (primary) hypertension: Secondary | ICD-10-CM

## 2022-08-02 DIAGNOSIS — H35033 Hypertensive retinopathy, bilateral: Secondary | ICD-10-CM | POA: Diagnosis not present

## 2022-08-02 DIAGNOSIS — E113593 Type 2 diabetes mellitus with proliferative diabetic retinopathy without macular edema, bilateral: Secondary | ICD-10-CM

## 2022-08-02 DIAGNOSIS — I129 Hypertensive chronic kidney disease with stage 1 through stage 4 chronic kidney disease, or unspecified chronic kidney disease: Secondary | ICD-10-CM | POA: Diagnosis not present

## 2022-08-02 DIAGNOSIS — Z794 Long term (current) use of insulin: Secondary | ICD-10-CM | POA: Diagnosis not present

## 2022-08-02 DIAGNOSIS — N184 Chronic kidney disease, stage 4 (severe): Secondary | ICD-10-CM | POA: Diagnosis not present

## 2022-08-02 DIAGNOSIS — E1129 Type 2 diabetes mellitus with other diabetic kidney complication: Secondary | ICD-10-CM | POA: Diagnosis not present

## 2022-08-05 DIAGNOSIS — H903 Sensorineural hearing loss, bilateral: Secondary | ICD-10-CM | POA: Diagnosis not present

## 2022-08-08 DIAGNOSIS — N1832 Chronic kidney disease, stage 3b: Secondary | ICD-10-CM | POA: Diagnosis not present

## 2022-08-08 DIAGNOSIS — G894 Chronic pain syndrome: Secondary | ICD-10-CM | POA: Diagnosis not present

## 2022-08-08 DIAGNOSIS — M5416 Radiculopathy, lumbar region: Secondary | ICD-10-CM | POA: Diagnosis not present

## 2022-08-08 DIAGNOSIS — M5136 Other intervertebral disc degeneration, lumbar region: Secondary | ICD-10-CM | POA: Diagnosis not present

## 2022-08-08 DIAGNOSIS — M961 Postlaminectomy syndrome, not elsewhere classified: Secondary | ICD-10-CM | POA: Diagnosis not present

## 2022-08-11 ENCOUNTER — Other Ambulatory Visit: Payer: Self-pay | Admitting: Cardiology

## 2022-08-11 DIAGNOSIS — E1129 Type 2 diabetes mellitus with other diabetic kidney complication: Secondary | ICD-10-CM | POA: Diagnosis not present

## 2022-08-16 DIAGNOSIS — R7989 Other specified abnormal findings of blood chemistry: Secondary | ICD-10-CM | POA: Diagnosis not present

## 2022-08-16 DIAGNOSIS — E785 Hyperlipidemia, unspecified: Secondary | ICD-10-CM | POA: Diagnosis not present

## 2022-08-16 DIAGNOSIS — D649 Anemia, unspecified: Secondary | ICD-10-CM | POA: Diagnosis not present

## 2022-08-16 DIAGNOSIS — I10 Essential (primary) hypertension: Secondary | ICD-10-CM | POA: Diagnosis not present

## 2022-08-16 DIAGNOSIS — M109 Gout, unspecified: Secondary | ICD-10-CM | POA: Diagnosis not present

## 2022-08-16 DIAGNOSIS — E1129 Type 2 diabetes mellitus with other diabetic kidney complication: Secondary | ICD-10-CM | POA: Diagnosis not present

## 2022-08-16 DIAGNOSIS — Z125 Encounter for screening for malignant neoplasm of prostate: Secondary | ICD-10-CM | POA: Diagnosis not present

## 2022-08-23 DIAGNOSIS — N184 Chronic kidney disease, stage 4 (severe): Secondary | ICD-10-CM | POA: Diagnosis not present

## 2022-08-23 DIAGNOSIS — N2581 Secondary hyperparathyroidism of renal origin: Secondary | ICD-10-CM | POA: Diagnosis not present

## 2022-08-23 DIAGNOSIS — Z0000 Encounter for general adult medical examination without abnormal findings: Secondary | ICD-10-CM | POA: Diagnosis not present

## 2022-08-23 DIAGNOSIS — R82998 Other abnormal findings in urine: Secondary | ICD-10-CM | POA: Diagnosis not present

## 2022-08-23 DIAGNOSIS — Z1339 Encounter for screening examination for other mental health and behavioral disorders: Secondary | ICD-10-CM | POA: Diagnosis not present

## 2022-08-23 DIAGNOSIS — Z1331 Encounter for screening for depression: Secondary | ICD-10-CM | POA: Diagnosis not present

## 2022-08-23 DIAGNOSIS — Z794 Long term (current) use of insulin: Secondary | ICD-10-CM | POA: Diagnosis not present

## 2022-08-23 DIAGNOSIS — E1129 Type 2 diabetes mellitus with other diabetic kidney complication: Secondary | ICD-10-CM | POA: Diagnosis not present

## 2022-08-23 DIAGNOSIS — D631 Anemia in chronic kidney disease: Secondary | ICD-10-CM | POA: Diagnosis not present

## 2022-08-23 DIAGNOSIS — N1832 Chronic kidney disease, stage 3b: Secondary | ICD-10-CM | POA: Diagnosis not present

## 2022-08-23 DIAGNOSIS — I129 Hypertensive chronic kidney disease with stage 1 through stage 4 chronic kidney disease, or unspecified chronic kidney disease: Secondary | ICD-10-CM | POA: Diagnosis not present

## 2022-08-23 DIAGNOSIS — E1159 Type 2 diabetes mellitus with other circulatory complications: Secondary | ICD-10-CM | POA: Diagnosis not present

## 2022-08-23 DIAGNOSIS — E1142 Type 2 diabetes mellitus with diabetic polyneuropathy: Secondary | ICD-10-CM | POA: Diagnosis not present

## 2022-08-23 DIAGNOSIS — I25810 Atherosclerosis of coronary artery bypass graft(s) without angina pectoris: Secondary | ICD-10-CM | POA: Diagnosis not present

## 2022-08-23 DIAGNOSIS — I6523 Occlusion and stenosis of bilateral carotid arteries: Secondary | ICD-10-CM | POA: Diagnosis not present

## 2022-08-23 DIAGNOSIS — E11319 Type 2 diabetes mellitus with unspecified diabetic retinopathy without macular edema: Secondary | ICD-10-CM | POA: Diagnosis not present

## 2022-10-07 DIAGNOSIS — H903 Sensorineural hearing loss, bilateral: Secondary | ICD-10-CM | POA: Diagnosis not present

## 2022-10-31 DIAGNOSIS — E1129 Type 2 diabetes mellitus with other diabetic kidney complication: Secondary | ICD-10-CM | POA: Diagnosis not present

## 2022-11-08 DIAGNOSIS — Z794 Long term (current) use of insulin: Secondary | ICD-10-CM | POA: Diagnosis not present

## 2022-11-08 DIAGNOSIS — N184 Chronic kidney disease, stage 4 (severe): Secondary | ICD-10-CM | POA: Diagnosis not present

## 2022-11-08 DIAGNOSIS — E1129 Type 2 diabetes mellitus with other diabetic kidney complication: Secondary | ICD-10-CM | POA: Diagnosis not present

## 2022-11-08 DIAGNOSIS — I129 Hypertensive chronic kidney disease with stage 1 through stage 4 chronic kidney disease, or unspecified chronic kidney disease: Secondary | ICD-10-CM | POA: Diagnosis not present

## 2022-11-08 DIAGNOSIS — I25810 Atherosclerosis of coronary artery bypass graft(s) without angina pectoris: Secondary | ICD-10-CM | POA: Diagnosis not present

## 2022-11-14 ENCOUNTER — Other Ambulatory Visit: Payer: Self-pay | Admitting: Cardiology

## 2022-12-08 DIAGNOSIS — M5136 Other intervertebral disc degeneration, lumbar region: Secondary | ICD-10-CM | POA: Diagnosis not present

## 2022-12-08 DIAGNOSIS — Z5181 Encounter for therapeutic drug level monitoring: Secondary | ICD-10-CM | POA: Diagnosis not present

## 2022-12-08 DIAGNOSIS — M5416 Radiculopathy, lumbar region: Secondary | ICD-10-CM | POA: Diagnosis not present

## 2022-12-08 DIAGNOSIS — M961 Postlaminectomy syndrome, not elsewhere classified: Secondary | ICD-10-CM | POA: Diagnosis not present

## 2022-12-08 DIAGNOSIS — Z79899 Other long term (current) drug therapy: Secondary | ICD-10-CM | POA: Diagnosis not present

## 2022-12-14 ENCOUNTER — Ambulatory Visit: Payer: Medicare PPO | Admitting: Neurology

## 2023-01-02 ENCOUNTER — Other Ambulatory Visit: Payer: Self-pay | Admitting: Cardiology

## 2023-01-02 DIAGNOSIS — N1832 Chronic kidney disease, stage 3b: Secondary | ICD-10-CM | POA: Diagnosis not present

## 2023-01-02 DIAGNOSIS — I10 Essential (primary) hypertension: Secondary | ICD-10-CM

## 2023-01-09 ENCOUNTER — Ambulatory Visit: Payer: Medicare PPO | Admitting: Cardiology

## 2023-01-09 ENCOUNTER — Encounter: Payer: Self-pay | Admitting: Cardiology

## 2023-01-09 VITALS — BP 140/66 | HR 54 | Resp 16 | Ht 65.0 in | Wt 188.0 lb

## 2023-01-09 DIAGNOSIS — E7800 Pure hypercholesterolemia, unspecified: Secondary | ICD-10-CM

## 2023-01-09 DIAGNOSIS — N184 Chronic kidney disease, stage 4 (severe): Secondary | ICD-10-CM | POA: Diagnosis not present

## 2023-01-09 DIAGNOSIS — I6523 Occlusion and stenosis of bilateral carotid arteries: Secondary | ICD-10-CM | POA: Diagnosis not present

## 2023-01-09 DIAGNOSIS — I25118 Atherosclerotic heart disease of native coronary artery with other forms of angina pectoris: Secondary | ICD-10-CM | POA: Diagnosis not present

## 2023-01-09 DIAGNOSIS — E1122 Type 2 diabetes mellitus with diabetic chronic kidney disease: Secondary | ICD-10-CM | POA: Diagnosis not present

## 2023-01-09 DIAGNOSIS — Z794 Long term (current) use of insulin: Secondary | ICD-10-CM

## 2023-01-09 DIAGNOSIS — I129 Hypertensive chronic kidney disease with stage 1 through stage 4 chronic kidney disease, or unspecified chronic kidney disease: Secondary | ICD-10-CM | POA: Diagnosis not present

## 2023-01-09 NOTE — Progress Notes (Signed)
Primary Physician/Referring:  Ginger Organ., MD  Patient ID: Allen Medina, male    DOB: 06/24/1941, 82 y.o.   MRN: [id number]   Chief Complaint  Patient presents with   Coronary Artery Disease   Hypertension   Follow-up    6 month    HPI   Allen Medina  is a 82 y.o. Caucasian male with type 2 diabetes, stage IV CKD being followed by Dr. Elmarie Shiley, diabetic peripheral neuropathy, diabetic retinopathy, hypertension, hyperlipidemia, chronic back pain and DJD and asymptomatic bilateral carotid stenosis, CAD and S/P CABG 1991 followed by redo CABG in 2003 due to occluded bypass grafts except LIMA to LAD,  MI in 2006 needing stent to SVG to RCA, acute inferior and lateral myocardial infarction on 11/13/2019, and had stenting to SVG to OM 2 and also to native distal OM 2 with implantation of 2 overlapping DES in [place].  He presents for 81-monthoffice visit, he has been walking on a daily basis without any limitations.  His backache still continues to be a problem.  He has not used any sublingual nitroglycerin.  Past Medical History:  Diagnosis Date   Arthritis    "hands" (04/16/2014)   Blindness of left eye    CAD (coronary artery disease) of bypass graft 2000   Occluded grafts --> redo CABG   CAD, multiple vessel 1991   last cath 2006-patent grafts, last nuc 09/10/09-no ischemia   Chronic lower back pain    CKD (chronic kidney disease) stage 4, GFR 15-29 ml/min (HCC)    kidney stones,cmet 02/09/12, 02/13/12 on chart   Claudication ([facility]    LE dopplers 03/17/10, normal ABIs, normal pressures   Diabetes mellitus type 2 with complications ([facility]    renal insuff, HTN, CAD, PVD   History of blood transfusion 2010   "after back OR" (04/16/2014)   HTN (hypertension)    difficult to control   Hx of myocardial infarction    Hyperlipidemia LDL goal < 100    Kidney stones    Myocardial infarction ([facility] [place]   LOV with EKG Dr LRex Kras7/12 on chart, eccho 10/03, stress  test 9/10 on chart, chest x ray 2/13 EPIC, chest CT 02/13/12 on chart   Neuropathy in diabetes ([facility]    Peripheral vascular disease ([facility]    peripheral neuropathy, claudication   Pneumonia    hx of   S/P CABG x 4 2000   SVG-OM1, SVG-OM 2-OM 3, SVG-diagonal, free RIMA-RPDA ( has stent in the vessel from 2003)-   S/P CABG x 5    only LIMA-LAD still patent   Spinal stenosis    Thin skin    Vitamin B 12 deficiency    Past Surgical History:  Procedure Laterality Date   ANTERIOR LAT LUMBAR FUSION N/A 04/16/2014   Procedure: Lateral  Lumbar Two-Three/Three-Four Interbody and Fusion with Lateral Pedicle Screw Fixation Lumbar Two-Four;  Surgeon: DMelina Schools MD;  [facility]  Service: Orthopedics;  Laterality: N/A;   BACK SURGERY  2009, 2010   CARDIAC CATHETERIZATION  06/04/1990   severe 3 vessel dz   CARDIAC CATHETERIZATION  01/13/97   patent grafts, nl LV function   CARDIAC CATHETERIZATION  01/28/1999   severe native CAD with disease involving all 3 SVG, recommend redo CABG   CARDIAC CATHETERIZATION  05/16/2002   patent graft except of mod to sever stenosis in the midprotion of the RIMA to the posterior descending artery   CARDIAC CATHETERIZATION  08/28/2003   nl lv fxn, patent grafts to all vessels with small vessel disease   CARDIAC CATHETERIZATION  07/07/2005   patent grafts, EF 45-50%   CARPAL TUNNEL RELEASE Right    CATARACT EXTRACTION W/ INTRAOCULAR LENS  IMPLANT, BILATERAL     CHOLECYSTECTOMY     COLONOSCOPY N/A 03/31/2017   Procedure: COLONOSCOPY;  Surgeon: Carol Ada, MD;  Location: [facility];  Service: Endoscopy;  Laterality: N/A;   CORONARY ANGIOPLASTY WITH STENT PLACEMENT  05/22/2002   3.0x55m cypher stent covered the proximal and distal portion of RIMA, final inflation -16x60   CORONARY ARTERY BYPASS GRAFT  1991 & 2000   only LIMA-LAD patent   CYSTOSCOPY W/ STONE MANIPULATION  2013   CYSTOSCOPY/RETROGRADE/URETEROSCOPY/STONE EXTRACTION WITH BASKET  02/16/2012    Procedure: CYSTOSCOPY/RETROGRADE/URETEROSCOPY/STONE EXTRACTION WITH BASKET;  Surgeon: JMalka So MD;  Location: [facility];  Service: Urology;  Laterality: Left;  Left Ureterscopy with Stone Extraction   DOPPLER ECHOCARDIOGRAPHY  07/09/2013   EF 65-78 (up from 50-55% in 2013), minimally increased transvalvular velocity across the aortic valve suggestive of very mild stenosis to simply sclerosis. Normal diastolic indices   DOPPLER ECHOCARDIOGRAPHY     LATERAL FUSION LUMBAR SPINE  04/16/2014   NM MYOVIEW LTD  02/2013   no evidence of ischemia or infarct, septal hypokinesis/dyskinesis    REDO - CORONARY ARTERY BYPASS GRAFT  2000    previous LIMA to LAD patent; seqSVG - OM 2, OM 3, SVG-D3, SVG-OM1, fRIMA-rPDA   TONSILLECTOMY     Family History  Problem Relation Age of Onset   Heart disease Father    Diabetes Father    Heart attack Father    Arthritis-Osteo Sister    Arthritis-Osteo Sister    Migraines Son     Social History   Tobacco Use   Smoking status: Never   Smokeless tobacco: Never  Substance Use Topics   Alcohol use: No   Marital Status: Married  ROS:   Review of Systems  Constitutional: Negative.  HENT:  Positive for hearing loss (left).   Cardiovascular:  Positive for chest pain (Stable), dyspnea on exertion (Stable) and leg swelling (Occasional). Negative for claudication.  Musculoskeletal:  Positive for back pain.  Gastrointestinal:  Negative for melena.  Neurological:  Positive for disturbances in coordination.   Objective  Blood pressure (!) 140/66, pulse (!) 54, resp. rate 16, height '5\' 5"'$  (1.651 m), weight 188 lb (85.3 kg), SpO2 97 %. Body mass index is 31.28 kg/m.      01/09/2023    9:59 AM 07/19/2022    7:46 AM 07/07/2022    8:58 AM  Vitals with BMI  Height '5\' 5"'$  '5\' 5"'$  '5\' 6"'$   Weight 188 lbs 192 lbs 184 lbs  BMI 31.28 [phone] Systolic [id number] Diastolic 66 50 69  Pulse 54 50 51    Orthostatic VS for the past 72 hrs (Last 3 readings):  Patient  Position BP Location Cuff Size  01/09/23 0959 Sitting Left Arm Normal    Physical Exam Constitutional:      Appearance: He is well-developed.     Comments: Mildly obese  Neck:     Vascular: No carotid bruit or JVD.  Cardiovascular:     Rate and Rhythm: Normal rate and regular rhythm.     Pulses:          Carotid pulses are  on the right side with bruit.      Femoral pulses are 2+ on the  right side and 2+ on the left side.      Popliteal pulses are 2+ on the right side and 2+ on the left side.       Dorsalis pedis pulses are 0 on the right side and 0 on the left side.       Posterior tibial pulses are 0 on the right side and 0 on the left side.     Heart sounds: Normal heart sounds. No murmur heard.    No gallop.  Pulmonary:     Effort: Pulmonary effort is normal.     Breath sounds: Normal breath sounds.  Abdominal:     General: Bowel sounds are normal.     Palpations: Abdomen is soft.  Musculoskeletal:     Right lower leg: No edema.     Left lower leg: No edema.    Laboratory examination:   Lab Results  Component Value Date   NA 139 12/24/2021   K 4.6 12/24/2021   CO2 23 12/24/2021   GLUCOSE 203 (H) 12/24/2021   BUN 45 (H) 12/24/2021   CREATININE 2.60 (H) 12/24/2021   CALCIUM 9.4 12/24/2021   EGFR 24 (L) 12/24/2021   GFRNONAA 18 (L) 01/11/2020       Latest Ref Rng & Units 12/24/2021    1:47 PM 01/11/2020    6:09 AM 01/10/2020    5:19 AM  CMP  Glucose 70 - 99 mg/dL 203  83  129   BUN 8 - 27 mg/dL 45  51  64   Creatinine 0.76 - 1.27 mg/dL 2.60  3.20  3.24   Sodium 134 - 144 mmol/L 139  139  138   Potassium 3.5 - 5.2 mmol/L 4.6  4.8  4.4   Chloride 96 - 106 mmol/L 97  108  105   CO2 20 - 29 mmol/L '23  22  22   '$ Calcium 8.6 - 10.2 mg/dL 9.4  9.0  8.4   Total Protein 6.0 - 8.5 g/dL 8.0     Total Bilirubin 0.0 - 1.2 mg/dL 0.5     Alkaline Phos 44 - 121 IU/L 149     AST 0 - 40 IU/L 24     ALT 0 - 44 IU/L 16         Latest Ref Rng & Units 01/11/2020    6:09 AM  01/10/2020    5:19 AM 01/09/2020    8:31 AM  CBC  WBC 4.0 - 10.5 K/uL 7.1  8.2  9.3   Hemoglobin 13.0 - 17.0 g/dL 11.0  10.8  11.8   Hematocrit 39.0 - 52.0 % 33.2  32.8  36.5   Platelets 150 - 400 K/uL 175  173  203    Lab Results  Component Value Date   CHOL 140 12/24/2021   HDL 42 12/24/2021   LDLCALC 69 12/24/2021   TRIG 170 (H) 12/24/2021   CHOLHDL 2.7 01/10/2020     HEMOGLOBIN A1C Lab Results  Component Value Date   HGBA1C 7.3 (H) 12/24/2021   MPG 128.37 01/09/2020   External Labs:  Labs 01/03/2023:  Hb 14.3/HCT 41.6, platelets 256, normal indicis.  Serum glucose 139 mg, BUN 35, creatinine 2.06, EGFR 32 mL, potassium 4.2.  Vitamin D 68.8.  Cholesterol, total 133.000 m 08/16/2022 HDL 51.000 mg 08/16/2022 LDL 68.000 mg 08/16/2022 Triglycerides 70.000 mg 08/16/2022  A1C 6.600 % 08/16/2022 TSH 1.110 08/16/2022  Hemoglobin 15.300 G/ 08/08/2022 Platelets 243.000 X1 8/21/2023A1C 6.600 % 04/13/2022  Creatinine, Serum 2.200 MG/  08/08/2022 Potassium 4.300 mEq 08/16/2022 ALT (SGPT) 18.000 IU/ 08/16/2022   Medication and allergies:   Allergies  Allergen Reactions   Atorvastatin     Other reaction(s): myalgias   Pravastatin     Other reaction(s): myalgias   Sitagliptin Nausea And Vomiting     Current Outpatient Medications:    albuterol (VENTOLIN HFA) 108 (90 Base) MCG/ACT inhaler, Inhale 2 puffs into the lungs every 6 (six) hours as needed for wheezing or shortness of breath., Disp: , Rfl:    amLODipine (NORVASC) 10 MG tablet, TAKE (1) TABLET BY MOUTH ONCE DAILY., Disp: 90 tablet, Rfl: 0   aspirin (ASPIRIN CHILDRENS) 81 MG chewable tablet, Chew 1 tablet (81 mg total) by mouth daily., Disp: , Rfl:    carvedilol (COREG) 12.5 MG tablet, TAKE 1 TABLET BY MOUTH TWICE DAILY WITH MEALS., Disp: 180 tablet, Rfl: 0   Cholecalciferol (VITAMIN D3) 5000 units CAPS, Take 5,000 Units by mouth daily., Disp: , Rfl:    cyanocobalamin (,VITAMIN B-12,) 1000 MCG/ML injection, Inject 1,000 mcg  into the muscle every 30 (thirty) days. , Disp: , Rfl:    Dulaglutide 1.5 MG/0.5ML SOPN, Inject 1.5 mg into the skin every Saturday. TRULICITY, Disp: , Rfl:    Febuxostat 80 MG TABS, Take 80 mg by mouth daily., Disp: , Rfl:    furosemide (LASIX) 40 MG tablet, Take 40 mg by mouth daily., Disp: , Rfl:    HYDROcodone-acetaminophen (NORCO/VICODIN) 5-325 MG tablet, Take 1 tablet by mouth every 6 (six) hours as needed., Disp: 14 tablet, Rfl: 0   Insulin Aspart FlexPen 100 UNIT/ML SOPN, Inject 10-16 mLs as directed 2 (two) times daily., Disp: , Rfl:    insulin glargine, 2 Unit Dial, (TOUJEO MAX SOLOSTAR) 300 UNIT/ML Solostar Pen, Inject into the skin., Disp: , Rfl:    JARDIANCE 10 MG TABS tablet, Take 10 mg by mouth daily., Disp: , Rfl:    nitroGLYCERIN (NITROSTAT) 0.4 MG SL tablet, Place 1 tablet (0.4 mg total) under the tongue every 5 (five) minutes as needed for chest pain. DISSOLVE 1 TABLET UNDER TONGUE EVERY 5 MINUTES UP TO 15 MIN FOR CHEST PAIN. IF NO RELIEF CALL 911, Disp: 25 tablet, Rfl: 0   pregabalin (LYRICA) 75 MG capsule, Take 75 mg by mouth 3 (three) times daily., Disp: , Rfl:    rosuvastatin (CRESTOR) 10 MG tablet, Take 10 mg by mouth daily., Disp: , Rfl:    tadalafil (CIALIS) 5 MG tablet, Take 5 mg by mouth at bedtime. , Disp: , Rfl:    zolpidem (AMBIEN) 10 MG tablet, Take 10 mg by mouth at bedtime., Disp: , Rfl:    pantoprazole (PROTONIX) 40 MG tablet, Take 40 mg by mouth daily as needed., Disp: , Rfl:   Radiology:   CX-ray 01/09/2020 portable Multiple scratch that sternotomy scar noted.  Heart size within normal limits.  No focal airspace consolidation, pleural effusion or pneumothorax.  Cardiac Studies:   ABI 12/29/2017: Unable to obtain due to medial calcinosis.  Bilateral toe brachial indices are normal.  Coronary angiogram 11/13/2019 at [facility], [place]:  Distal left main 80% stenosed, LAD occluded proximally, LIMA to LAD patent.   Left  circumflex is small.  SVG to OM 2-3 has large thrombus burden and is occluded just before OM 3.  S/P thrombectomy followed by 3.5 x 38 mm Synergy and distally 2.25 x 12 mm Synergy DES. RCA is codominant.  There are tandem 80% stenosis in the mid and distal vessel.  Right PDA is chronically occluded.  SVG to RCA has a 70% proximal stenosis prior to a widely patent stent in the midportion of the graft.  Distal to the stent 80% tandem stenosis with good runoff.  Echocardiogram 05/13/2021: Left ventricle cavity is normal in size and wall thickness. Abnormal septal wall motion due to post-operative coronary artery bypass graft. Normal LV systolic function with EF 56%. Doppler evidence of grade II (pseudonormal) diastolic dysfunction, elevated LAP. Mild (Grade I) mitral regurgitation. Mild tricuspid regurgitation. No evidence of pulmonary hypertension. No significant change compared to previous study in 2019.  Lexiscan Tetrofosmin stress test 05/24/2021: Lexiscan nuclear stress test performed using 1-day protocol. SPECT images show moderate sized, medium intensity, predominantly fixed perfusion defect in mid to apical inferior/inferolateral myocardium with minimal inferior reversibility, along with mid to apical inferior/inferolateral hypokinesis. Stress LVEF 44%. High risk study.  Carotid artery duplex 06/28/2022: Duplex suggests stenosis in the right internal carotid artery (16-49%), upper limit of the spectrum. Duplex suggests stenosis in the left internal carotid artery (1-15%). Antegrade right vertebral artery flow. Antegrade left vertebral artery flow. Compared to the study done on 10/18/2021, left ICA stenosis of 50 to 69% is now <50%.   Follow up in one year is appropriate if clinically indicated  EKG   EKG 01/09/2023: Normal sinus rhythm at rate of 52 bpm, normal axis, baseline artifact.  Nonspecific T abnormality.  Compared to 07/07/2022, no significant change.   Assessment     ICD-10-CM    1. Coronary artery disease of native artery of native heart with stable angina pectoris ([place])  I25.118 EKG 12-Lead    2. Asymptomatic bilateral carotid artery stenosis  I65.23 PCV CAROTID DUPLEX (BILATERAL)    CANCELED: PCV CAROTID DUPLEX (BILATERAL)    3. Hypercholesteremia  E78.00     4. Type 2 diabetes mellitus with stage 4 chronic kidney disease, with long-term current use of insulin ([place])  E11.22    N18.4    Z79.4       No orders of the defined types were placed in this encounter.  Orders Placed This Encounter  Procedures   EKG 12-Lead    There are no discontinued medications.   Recommendations:   Allen Medina  is a 82 y.o. Caucasian male with type 2 diabetes, stage IV CKD being followed by Dr. Elmarie Shiley, diabetic peripheral neuropathy, diabetic retinopathy, hypertension, hyperlipidemia, chronic back pain and DJD and asymptomatic bilateral carotid stenosis, CAD and S/P CABG 1991 followed by redo CABG in 2003 due to occluded bypass grafts except LIMA to LAD,  MI in 2006 needing stent to SVG to RCA, acute inferior and lateral myocardial infarction on 11/13/2019, and had stenting to SVG to OM 2 and also to native distal OM 2 with implantation of 2 overlapping DES in [place].  He presents for 37-monthoffice visit. He has not used any sublingual nitroglycerin.  1. Coronary artery disease of native artery of native heart with stable angina pectoris ([facility] Patient currently doing well and has not had any recurrence of angina pectoris.  He has been walking, on a regular basis without any dyspnea or chest pain.  2. Asymptomatic bilateral carotid artery stenosis Asymptomatic bilateral carotid artery stenosis, no TIA or neurologic deficits.  Carotid artery duplex performed on 06/28/2022 had revealed very mild disease, I will recheck this in 6 months.  3. Hypercholesteremia Reviewed his external labs, lipids under excellent control.  No changes in the medications were done  today.  4. Type 2  diabetes mellitus with stage 4 chronic kidney disease, with long-term current use of insulin ([place]) I reviewed the external labs both from his PCP and also from nephrology.  Fortunately renal function has remained stable over time and diabetes is now well-controlled.  Overall stable from cardiac standpoint, I will see him back in 6 months in view of major cardiovascular risk factors.    Adrian Prows, MD, [facility] 01/09/2023, 11:05 AM Office: [phone] Pager: [phone]

## 2023-01-13 DIAGNOSIS — N1832 Chronic kidney disease, stage 3b: Secondary | ICD-10-CM | POA: Diagnosis not present

## 2023-01-13 DIAGNOSIS — N2581 Secondary hyperparathyroidism of renal origin: Secondary | ICD-10-CM | POA: Diagnosis not present

## 2023-01-13 DIAGNOSIS — D631 Anemia in chronic kidney disease: Secondary | ICD-10-CM | POA: Diagnosis not present

## 2023-01-13 DIAGNOSIS — I129 Hypertensive chronic kidney disease with stage 1 through stage 4 chronic kidney disease, or unspecified chronic kidney disease: Secondary | ICD-10-CM | POA: Diagnosis not present

## 2023-01-23 IMAGING — MR MR HEAD W/O CM
10 series · 48 of 48 positions shown · non-contrast
Comparison: None.

CLINICAL DATA: Off balance issues for 3 days, loss of hearing in
left ear, dizziness

EXAM:
MRI HEAD WITHOUT CONTRAST
TECHNIQUE: Multiplanar, multiecho pulse sequences of the brain and surrounding
structures were obtained without intravenous contrast.

[Series 5: DWI · axial · 3.0mm · 1.36mm/px · z∈[-26,+113]mm · 9 of 96 slices shown (1 of 2)]
[im 1/96]
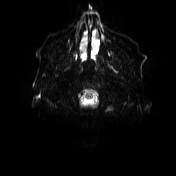
[im 12/96]
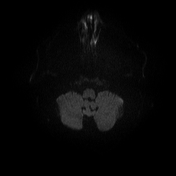
[im 24/96]
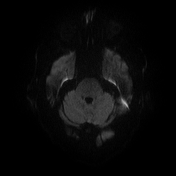
[im 36/96]
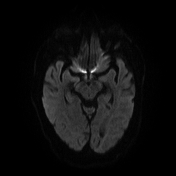
[im 48/96]
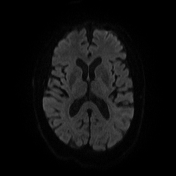
[im 60/96]
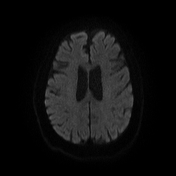
[im 72/96]
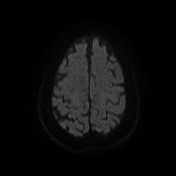
[im 84/96]
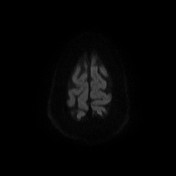
[im 96/96]
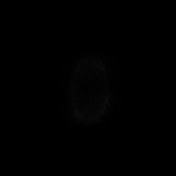

[Series 6: DWI · axial · 3.0mm · 1.36mm/px · z∈[-26,+113]mm · 5 of 48 slices shown (2 of 2)]
[im 1/48]
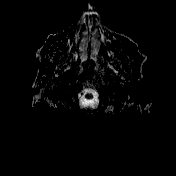
[im 12/48]
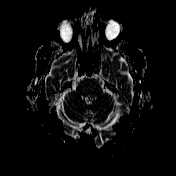
[im 24/48]
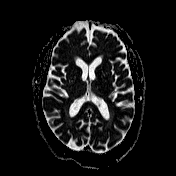
[im 36/48]
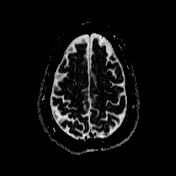
[im 48/48]
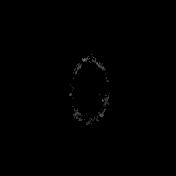

[Series 7: cor dwi_tracew · coronal · 5.0mm · 1.53mm/px · 5 of 58 slices shown]
[im 1/58]
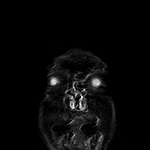
[im 15/58]
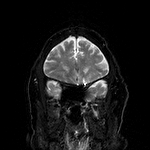
[im 29/58]
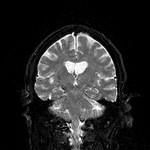
[im 43/58]
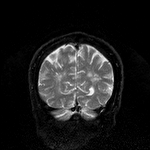
[im 58/58]
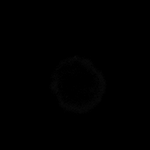

[Series 8: cor dwi_adc · coronal · 5.0mm · 1.53mm/px · 2 of 29 slices shown]
[im 1/29]
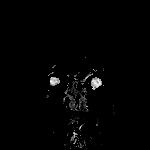
[im 29/29]
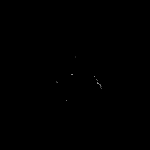

[Series 9: T1 · sagittal · 5.0mm · 0.75mm/px · 2 of 24 slices shown (1 of 2)]
[im 1/24]
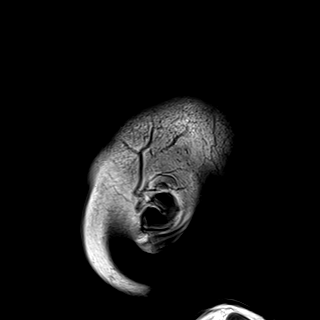
[im 24/24]
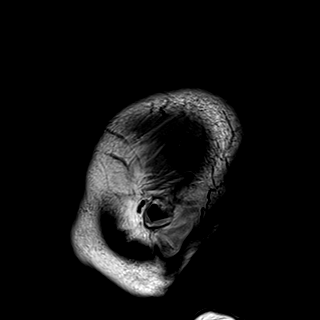

[Series 10: T2 · axial · 5.0mm · 0.62mm/px · z∈[-39,+121]mm · 2 of 26 slices shown (1 of 2)]
[im 1/26]
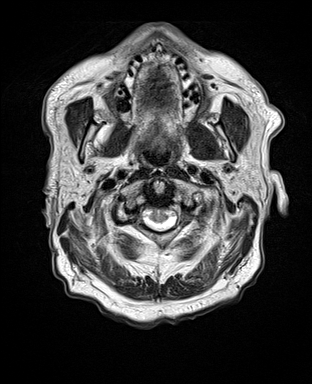
[im 26/26]
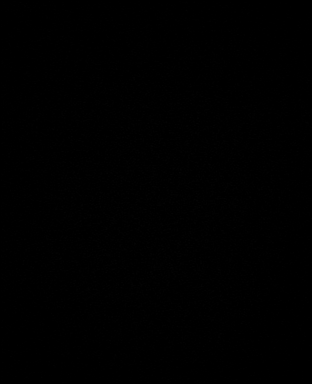

[Series 11: swi_images · axial · 3.0mm · 0.75mm/px · z∈[-35,+116]mm · 4 of 52 slices shown]
[im 1/52]
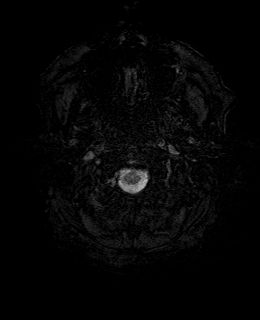
[im 18/52]
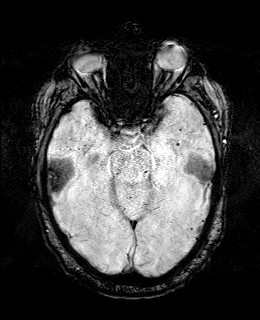
[im 35/52]
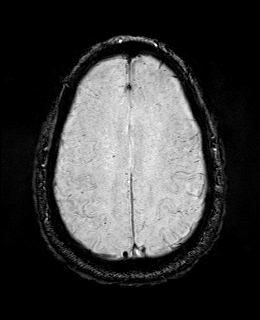
[im 52/52]
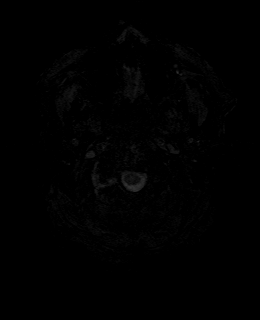

[Series 13: FLAIR · axial · 3.0mm · 0.75mm/px · z∈[-35,+116]mm · 4 of 52 slices shown]
[im 1/52]
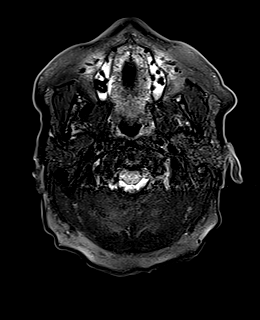
[im 18/52]
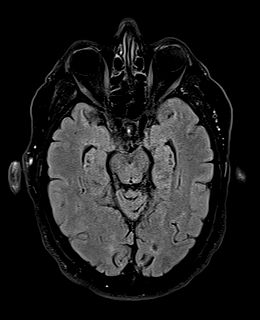
[im 35/52]
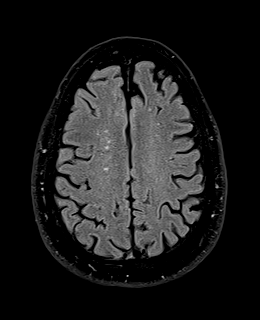
[im 52/52]
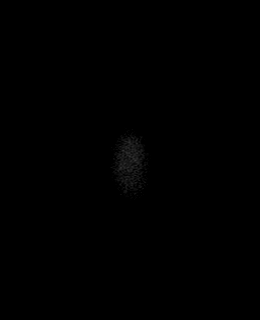

[Series 14: T1 · axial · 1.0mm · 0.94mm/px · z∈[-30,+111]mm · 12 of 144 slices shown (2 of 2)]
[im 1/144]
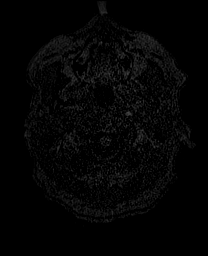
[im 14/144]
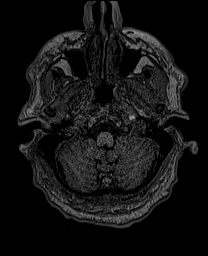
[im 27/144]
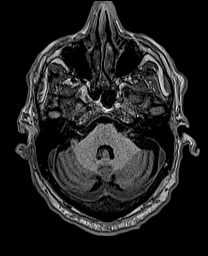
[im 40/144]
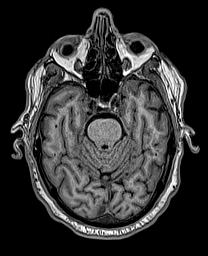
[im 53/144]
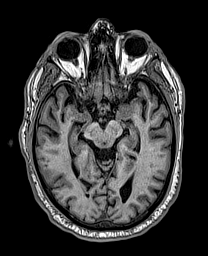
[im 66/144]
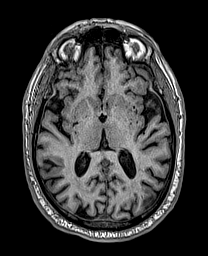
[im 79/144]
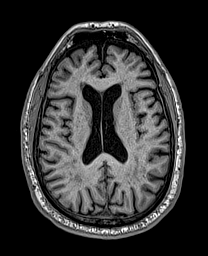
[im 92/144]
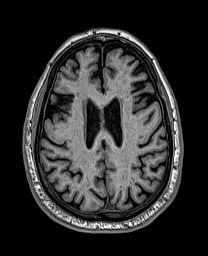
[im 105/144]
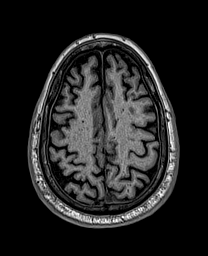
[im 118/144]
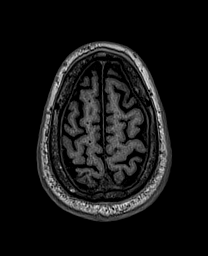
[im 131/144]
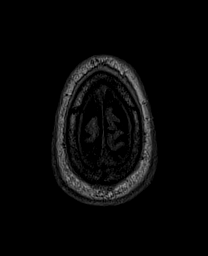
[im 144/144]
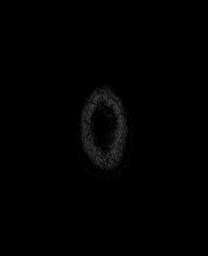

[Series 15: T2 · coronal · 5.0mm · 0.57mm/px · 3 of 34 slices shown (2 of 2)]
[im 1/34]
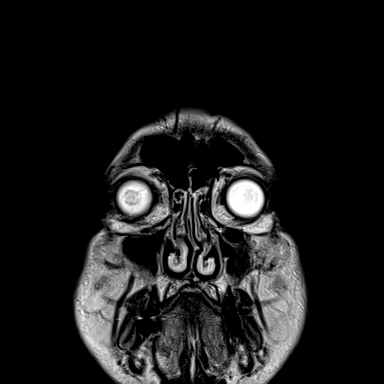
[im 17/34]
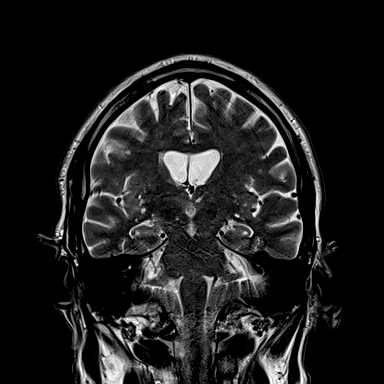
[im 34/34]
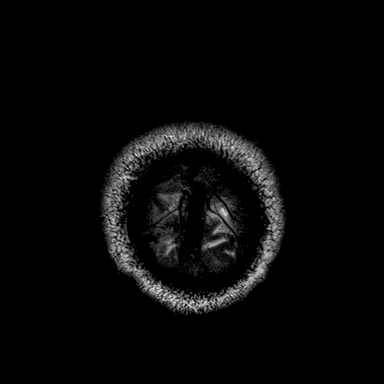

[48 of 48 positions shown; findings below may reference images not displayed]

FINDINGS: Brain: There is no evidence of acute intracranial hemorrhage,
extra-axial fluid collection, or acute infarct.

Background parenchymal volume is normal for age. The ventricles are
normal in size. Gray-white differentiation is preserved. Scattered
foci of FLAIR signal abnormality in the subcortical and
periventricular white matter likely reflects sequela of mild chronic
white matter microangiopathy. There are scattered punctate chronic
microhemorrhages in both cerebral hemispheres predominantly in a
peripheral distribution.

There is no suspicious parenchymal signal abnormality. There is no
mass lesion. There is no mass effect or midline shift.

The internal auditory canals and inner ear structures are grossly
unremarkable, though note that this study is not tailored to
evaluate the internal auditory canals or inner ear structures.

Vascular: Normal flow voids.

Skull and upper cervical spine: Normal marrow signal.

Sinuses/Orbits: The paranasal sinuses are clear. Bilateral lens
implants are in place. The globes and orbits are otherwise
unremarkable.

Other: There is no significant mastoid effusion.
IMPRESSION: 1. No acute intracranial pathology. Grossly unremarkable appearance
of the internal auditory canals and inner ear structures on these
nondedicated sequences.
2. Scattered punctate chronic microhemorrhages in both cerebral
hemispheres predominantly in a peripheral distribution raise
suspicion for cerebral amyloid angiopathy.
3. Background mild chronic white matter microangiopathy.

## 2023-01-26 DIAGNOSIS — E669 Obesity, unspecified: Secondary | ICD-10-CM | POA: Diagnosis not present

## 2023-01-26 DIAGNOSIS — N529 Male erectile dysfunction, unspecified: Secondary | ICD-10-CM | POA: Diagnosis not present

## 2023-01-26 DIAGNOSIS — E785 Hyperlipidemia, unspecified: Secondary | ICD-10-CM | POA: Diagnosis not present

## 2023-01-26 DIAGNOSIS — M545 Low back pain, unspecified: Secondary | ICD-10-CM | POA: Diagnosis not present

## 2023-01-26 DIAGNOSIS — J4489 Other specified chronic obstructive pulmonary disease: Secondary | ICD-10-CM | POA: Diagnosis not present

## 2023-01-26 DIAGNOSIS — G4700 Insomnia, unspecified: Secondary | ICD-10-CM | POA: Diagnosis not present

## 2023-01-26 DIAGNOSIS — I252 Old myocardial infarction: Secondary | ICD-10-CM | POA: Diagnosis not present

## 2023-01-26 DIAGNOSIS — H409 Unspecified glaucoma: Secondary | ICD-10-CM | POA: Diagnosis not present

## 2023-01-26 DIAGNOSIS — K219 Gastro-esophageal reflux disease without esophagitis: Secondary | ICD-10-CM | POA: Diagnosis not present

## 2023-01-29 DIAGNOSIS — E1129 Type 2 diabetes mellitus with other diabetic kidney complication: Secondary | ICD-10-CM | POA: Diagnosis not present

## 2023-02-20 DIAGNOSIS — D692 Other nonthrombocytopenic purpura: Secondary | ICD-10-CM | POA: Diagnosis not present

## 2023-02-20 DIAGNOSIS — I129 Hypertensive chronic kidney disease with stage 1 through stage 4 chronic kidney disease, or unspecified chronic kidney disease: Secondary | ICD-10-CM | POA: Diagnosis not present

## 2023-02-20 DIAGNOSIS — E669 Obesity, unspecified: Secondary | ICD-10-CM | POA: Diagnosis not present

## 2023-02-20 DIAGNOSIS — E1129 Type 2 diabetes mellitus with other diabetic kidney complication: Secondary | ICD-10-CM | POA: Diagnosis not present

## 2023-02-20 DIAGNOSIS — Z794 Long term (current) use of insulin: Secondary | ICD-10-CM | POA: Diagnosis not present

## 2023-02-20 DIAGNOSIS — E785 Hyperlipidemia, unspecified: Secondary | ICD-10-CM | POA: Diagnosis not present

## 2023-02-20 DIAGNOSIS — I25810 Atherosclerosis of coronary artery bypass graft(s) without angina pectoris: Secondary | ICD-10-CM | POA: Diagnosis not present

## 2023-02-20 DIAGNOSIS — N184 Chronic kidney disease, stage 4 (severe): Secondary | ICD-10-CM | POA: Diagnosis not present

## 2023-04-11 ENCOUNTER — Other Ambulatory Visit: Payer: Self-pay | Admitting: Cardiology

## 2023-04-11 DIAGNOSIS — I10 Essential (primary) hypertension: Secondary | ICD-10-CM

## 2023-04-29 DIAGNOSIS — E1129 Type 2 diabetes mellitus with other diabetic kidney complication: Secondary | ICD-10-CM | POA: Diagnosis not present

## 2023-05-08 DIAGNOSIS — N1832 Chronic kidney disease, stage 3b: Secondary | ICD-10-CM | POA: Diagnosis not present

## 2023-05-16 DIAGNOSIS — M961 Postlaminectomy syndrome, not elsewhere classified: Secondary | ICD-10-CM | POA: Diagnosis not present

## 2023-05-16 DIAGNOSIS — N2581 Secondary hyperparathyroidism of renal origin: Secondary | ICD-10-CM | POA: Diagnosis not present

## 2023-05-16 DIAGNOSIS — D631 Anemia in chronic kidney disease: Secondary | ICD-10-CM | POA: Diagnosis not present

## 2023-05-16 DIAGNOSIS — N1832 Chronic kidney disease, stage 3b: Secondary | ICD-10-CM | POA: Diagnosis not present

## 2023-05-16 DIAGNOSIS — M5136 Other intervertebral disc degeneration, lumbar region: Secondary | ICD-10-CM | POA: Diagnosis not present

## 2023-05-16 DIAGNOSIS — I129 Hypertensive chronic kidney disease with stage 1 through stage 4 chronic kidney disease, or unspecified chronic kidney disease: Secondary | ICD-10-CM | POA: Diagnosis not present

## 2023-06-06 DIAGNOSIS — I25810 Atherosclerosis of coronary artery bypass graft(s) without angina pectoris: Secondary | ICD-10-CM | POA: Diagnosis not present

## 2023-06-06 DIAGNOSIS — E1129 Type 2 diabetes mellitus with other diabetic kidney complication: Secondary | ICD-10-CM | POA: Diagnosis not present

## 2023-06-06 DIAGNOSIS — N184 Chronic kidney disease, stage 4 (severe): Secondary | ICD-10-CM | POA: Diagnosis not present

## 2023-06-06 DIAGNOSIS — Z794 Long term (current) use of insulin: Secondary | ICD-10-CM | POA: Diagnosis not present

## 2023-06-06 DIAGNOSIS — I129 Hypertensive chronic kidney disease with stage 1 through stage 4 chronic kidney disease, or unspecified chronic kidney disease: Secondary | ICD-10-CM | POA: Diagnosis not present

## 2023-06-27 ENCOUNTER — Ambulatory Visit: Payer: Medicare PPO

## 2023-06-27 DIAGNOSIS — I6523 Occlusion and stenosis of bilateral carotid arteries: Secondary | ICD-10-CM | POA: Diagnosis not present

## 2023-06-28 NOTE — Progress Notes (Signed)
Carotid artery duplex 06/27/2023: Duplex suggests stenosis in the right internal carotid artery (16-49%). Duplex suggests stenosis in the left internal carotid artery (1-15%). There is moderate amount of diffuse homogenous plaque throughout the bilateral carotid arteries. Antegrade right vertebral artery flow. Antegrade left vertebral artery flow. No significant change from 06/28/2022. Follow up in one year is appropriate if clinically indicated.

## 2023-07-09 ENCOUNTER — Other Ambulatory Visit: Payer: Self-pay | Admitting: Cardiology

## 2023-07-09 DIAGNOSIS — I10 Essential (primary) hypertension: Secondary | ICD-10-CM

## 2023-07-10 ENCOUNTER — Encounter: Payer: Self-pay | Admitting: Cardiology

## 2023-07-10 ENCOUNTER — Ambulatory Visit: Payer: Medicare PPO | Admitting: Cardiology

## 2023-07-10 VITALS — BP 133/69 | HR 54 | Resp 16 | Ht 65.0 in | Wt 187.8 lb

## 2023-07-10 DIAGNOSIS — I25118 Atherosclerotic heart disease of native coronary artery with other forms of angina pectoris: Secondary | ICD-10-CM

## 2023-07-10 DIAGNOSIS — I6523 Occlusion and stenosis of bilateral carotid arteries: Secondary | ICD-10-CM

## 2023-07-10 DIAGNOSIS — I129 Hypertensive chronic kidney disease with stage 1 through stage 4 chronic kidney disease, or unspecified chronic kidney disease: Secondary | ICD-10-CM | POA: Diagnosis not present

## 2023-07-10 DIAGNOSIS — N184 Chronic kidney disease, stage 4 (severe): Secondary | ICD-10-CM | POA: Diagnosis not present

## 2023-07-10 DIAGNOSIS — E7800 Pure hypercholesterolemia, unspecified: Secondary | ICD-10-CM

## 2023-07-10 NOTE — Telephone Encounter (Signed)
Request a refill and has an appt today

## 2023-07-10 NOTE — Progress Notes (Signed)
Primary Physician/Referring:  Cleatis Polka., MD  Patient ID: Allen Medina, male    DOB: [birth year], 82 y.o.   MRN: [id number]   Chief Complaint  Patient presents with  . Coronary artery disease of native artery of native heart wi    HPI   Allen Medina  is a 82 y.o. Caucasian male with type 2 diabetes, stage IV CKD being followed by Dr. Zetta Bills, diabetic peripheral neuropathy, diabetic retinopathy, hypertension, hyperlipidemia, chronic back pain and DJD and asymptomatic bilateral carotid stenosis, CAD and S/P CABG 1991 followed by redo CABG in 2003 due to occluded bypass grafts except LIMA to LAD,  MI in 2006 needing stent to SVG to RCA, acute inferior and lateral myocardial infarction on 11/13/2019, and had stenting to SVG to OM 2 and also to native distal OM 2 with implantation of 2 overlapping DES in [place].  He presents for 40-month office visit, he has been walking on a daily basis without any limitations.  His backache still continues to be a problem.  He has not used any sublingual nitroglycerin.  Past Medical History:  Diagnosis Date  . Arthritis    "hands" (04/16/2014)  . Blindness of left eye   . CAD (coronary artery disease) of bypass graft 2000   Occluded grafts --> redo CABG  . CAD, multiple vessel 1991   last cath 2006-patent grafts, last nuc 09/10/09-no ischemia  . Chronic lower back pain   . CKD (chronic kidney disease) stage 4, GFR 15-29 ml/min (HCC)    kidney stones,cmet 02/09/12, 02/13/12 on chart  . Claudication (HCC)    LE dopplers 03/17/10, normal ABIs, normal pressures  . Diabetes mellitus type 2 with complications (HCC)    renal insuff, HTN, CAD, PVD  . History of blood transfusion 2010   "after back OR" (04/16/2014)  . HTN (hypertension)    difficult to control  . Hx of myocardial infarction   . Hyperlipidemia LDL goal < 100   . Kidney stones   . Myocardial infarction (HCC) 2000   LOV with EKG Dr Clarene Duke 7/12 on chart, eccho 10/03,  stress test 9/10 on chart, chest x ray 2/13 EPIC, chest CT 02/13/12 on chart  . Neuropathy in diabetes (HCC)   . Peripheral vascular disease (HCC)    peripheral neuropathy, claudication  . Pneumonia    hx of  . S/P CABG x 4 2000   SVG-OM1, SVG-OM 2-OM 3, SVG-diagonal, free RIMA-RPDA ( has stent in the vessel from 2003)-  . S/P CABG x 5    only LIMA-LAD still patent  . Spinal stenosis   . Thin skin   . Vitamin B 12 deficiency    Past Surgical History:  Procedure Laterality Date  . ANTERIOR LAT LUMBAR FUSION N/A 04/16/2014   Procedure: Lateral  Lumbar Two-Three/Three-Four Interbody and Fusion with Lateral Pedicle Screw Fixation Lumbar Two-Four;  Surgeon: Venita Lick, MD;  Location: [facility] OR;  Service: Orthopedics;  Laterality: N/A;  . BACK SURGERY  2009, 2010  . CARDIAC CATHETERIZATION  06/04/1990   severe 3 vessel dz  . CARDIAC CATHETERIZATION  01/13/97   patent grafts, nl LV function  . CARDIAC CATHETERIZATION  01/28/1999   severe native CAD with disease involving all 3 SVG, recommend redo CABG  . CARDIAC CATHETERIZATION  05/16/2002   patent graft except of mod to sever stenosis in the midprotion of the RIMA to the posterior descending artery  . CARDIAC CATHETERIZATION  08/28/2003   nl  lv fxn, patent grafts to all vessels with small vessel disease  . CARDIAC CATHETERIZATION  07/07/2005   patent grafts, EF 45-50%  . CARPAL TUNNEL RELEASE Right   . CATARACT EXTRACTION W/ INTRAOCULAR LENS  IMPLANT, BILATERAL    . CHOLECYSTECTOMY    . COLONOSCOPY N/A 03/31/2017   Procedure: COLONOSCOPY;  Surgeon: Jeani Hawking, MD;  Location: [facility];  Service: Endoscopy;  Laterality: N/A;  . CORONARY ANGIOPLASTY WITH STENT PLACEMENT  05/22/2002   3.0x57mm cypher stent covered the proximal and distal portion of RIMA, final inflation -16x60  . CORONARY ARTERY BYPASS GRAFT  1991 & 2000   only LIMA-LAD patent  . CYSTOSCOPY W/ STONE MANIPULATION  2013  . CYSTOSCOPY/RETROGRADE/URETEROSCOPY/STONE  EXTRACTION WITH BASKET  02/16/2012   Procedure: CYSTOSCOPY/RETROGRADE/URETEROSCOPY/STONE EXTRACTION WITH BASKET;  Surgeon: Anner Crete, MD;  Location: [facility]S;  Service: Urology;  Laterality: Left;  Left Ureterscopy with Stone Extraction  . DOPPLER ECHOCARDIOGRAPHY  07/09/2013   EF 65-78 (up from 50-55% in 2013), minimally increased transvalvular velocity across the aortic valve suggestive of very mild stenosis to simply sclerosis. Normal diastolic indices  . DOPPLER ECHOCARDIOGRAPHY    . LATERAL FUSION LUMBAR SPINE  04/16/2014  . NM MYOVIEW LTD  02/2013   no evidence of ischemia or infarct, septal hypokinesis/dyskinesis   . REDO - CORONARY ARTERY BYPASS GRAFT  2000    previous LIMA to LAD patent; seqSVG - OM 2, OM 3, SVG-D3, SVG-OM1, fRIMA-rPDA  . TONSILLECTOMY     Family History  Problem Relation Age of Onset  . Heart disease Father   . Diabetes Father   . Heart attack Father   . Arthritis-Osteo Sister   . Arthritis-Osteo Sister   . Migraines Son     Social History   Tobacco Use  . Smoking status: Never  . Smokeless tobacco: Never  Substance Use Topics  . Alcohol use: No   Marital Status: Married  ROS:   Review of Systems  Cardiovascular:  Negative for chest pain, dyspnea on exertion and leg swelling.   Objective  Blood pressure 133/69, pulse (!) 54, resp. rate 16, height 5\' 5"  (1.651 m), weight 187 lb 12.8 oz (85.2 kg), SpO2 95%. Body mass index is 31.25 kg/m.      07/10/2023    9:52 AM 01/09/2023    9:59 AM 07/19/2022    7:46 AM  Vitals with BMI  Height 5\' 5"  5\' 5"  5\' 5"   Weight 187 lbs 13 oz 188 lbs 192 lbs  BMI 31.25 31.28 31.95  Systolic 133 140 829  Diastolic 69 66 50  Pulse 54 54 50    Orthostatic VS for the past 72 hrs (Last 3 readings):  Patient Position BP Location Cuff Size  07/10/23 0952 Sitting Left Arm Normal    Physical Exam Constitutional:      Appearance: He is well-developed.     Comments: Mildly obese  Neck:     Vascular: Carotid bruit  (right) present. No JVD.  Cardiovascular:     Rate and Rhythm: Normal rate and regular rhythm.     Pulses:          Femoral pulses are 2+ on the right side and 2+ on the left side.      Popliteal pulses are 2+ on the right side and 2+ on the left side.       Dorsalis pedis pulses are 0 on the right side and 0 on the left side.       Posterior  tibial pulses are 0 on the right side and 0 on the left side.     Heart sounds: Normal heart sounds. No murmur heard.    No gallop.  Pulmonary:     Effort: Pulmonary effort is normal.     Breath sounds: Normal breath sounds.  Abdominal:     General: Bowel sounds are normal.     Palpations: Abdomen is soft.  Musculoskeletal:     Right lower leg: No edema.     Left lower leg: No edema.   Laboratory examination:   External Labs:  Hemoglobin 14.100 G/ 05/08/2023 Platelets 231.000 X1 05/08/2023  Creatinine, Serum 1.950 MG/ 05/08/2023 CrCl Est 27.76 05/08/2023 eGFR 28.900 08/16/2022  Potassium 4.300 mEq 08/16/2022 Magnesium 2.400 MG/ 05/08/2023 ALT (SGPT) 18.000 IU/ 08/16/2022  TSH 1.110 08/16/2022 A1C 6.200 06/06/2023  Labs 01/03/2023:  Hb 14.3/HCT 41.6, platelets 256, normal indicis.  Serum glucose 139 mg, BUN 35, creatinine 2.06, EGFR 32 mL, potassium 4.2.  Vitamin D 68.8.  Cholesterol, total 133.000 m 08/16/2022 HDL 51.000 mg 08/16/2022 LDL 68.000 mg 08/16/2022 Triglycerides 70.000 mg 08/16/2022  A1C 6.600 % 08/16/2022 TSH 1.110 08/16/2022  Hemoglobin 15.300 G/ 08/08/2022 Platelets 243.000 X1 8/21/2023A1C 6.600 % 04/13/2022  Creatinine, Serum 2.200 MG/ 08/08/2022 Potassium 4.300 mEq 08/16/2022 ALT (SGPT) 18.000 IU/ 08/16/2022    Radiology:   CX-ray 01/09/2020 portable Multiple scratch that sternotomy scar noted.  Heart size within normal limits.  No focal airspace consolidation, pleural effusion or pneumothorax.  Cardiac Studies:   ABI 12/29/2017: Unable to obtain due to medial calcinosis.  Bilateral toe brachial indices are  normal.  Coronary angiogram 11/13/2019 at [facility], [place]:  Distal left main 80% stenosed, LAD occluded proximally, LIMA to LAD patent.   Left circumflex is small.  SVG to OM 2-3 has large thrombus burden and is occluded just before OM 3.  S/P thrombectomy followed by 3.5 x 38 mm Synergy and distally 2.25 x 12 mm Synergy DES. RCA is codominant.  There are tandem 80% stenosis in the mid and distal vessel.  Right PDA is chronically occluded.  SVG to RCA has a 70% proximal stenosis prior to a widely patent stent in the midportion of the graft.  Distal to the stent 80% tandem stenosis with good runoff.  Echocardiogram 05/13/2021: Left ventricle cavity is normal in size and wall thickness. Abnormal septal wall motion due to post-operative coronary artery bypass graft. Normal LV systolic function with EF 56%. Doppler evidence of grade II (pseudonormal) diastolic dysfunction, elevated LAP. Mild (Grade I) mitral regurgitation. Mild tricuspid regurgitation. No evidence of pulmonary hypertension. No significant change compared to previous study in 2019.  Lexiscan Tetrofosmin stress test 05/24/2021: Lexiscan nuclear stress test performed using 1-day protocol. SPECT images show moderate sized, medium intensity, predominantly fixed perfusion defect in mid to apical inferior/inferolateral myocardium with minimal inferior reversibility, along with mid to apical inferior/inferolateral hypokinesis. Stress LVEF 44%. High risk study.  Carotid artery duplex 06/27/2023: Duplex suggests stenosis in the right internal carotid artery (16-49%). Duplex suggests stenosis in the left internal carotid artery (1-15%). There is moderate amount of diffuse homogenous plaque throughout the bilateral carotid arteries. Antegrade right vertebral artery flow. Antegrade left vertebral artery flow. No significant change from 06/28/2022. Follow up in one year is appropriate if clinically  indicated.  EKG   EKG 07/10/2023: Normal sinus rhythm at rate of 53 bpm, normal axis, incomplete right bundle branch block.  Nonspecific T abnormality.  Compared to 01/09/2023, no change.  Medication and allergies:   Allergies  Allergen Reactions  . Atorvastatin     Other reaction(s): myalgias  . Pravastatin     Other reaction(s): myalgias  . Sitagliptin Nausea And Vomiting    Current Outpatient Medications:  .  albuterol (VENTOLIN HFA) 108 (90 Base) MCG/ACT inhaler, Inhale 2 puffs into the lungs every 6 (six) hours as needed for wheezing or shortness of breath., Disp: , Rfl:  .  amLODipine (NORVASC) 10 MG tablet, TAKE (1) TABLET BY MOUTH ONCE DAILY., Disp: 90 tablet, Rfl: 0 .  aspirin (ASPIRIN CHILDRENS) 81 MG chewable tablet, Chew 1 tablet (81 mg total) by mouth daily., Disp: , Rfl:  .  carvedilol (COREG) 12.5 MG tablet, TAKE 1 TABLET BY MOUTH TWICE DAILY WITH MEALS., Disp: 180 tablet, Rfl: 0 .  Cholecalciferol (VITAMIN D3) 5000 units CAPS, Take 5,000 Units by mouth daily., Disp: , Rfl:  .  cyanocobalamin (,VITAMIN B-12,) 1000 MCG/ML injection, Inject 1,000 mcg into the muscle every 30 (thirty) days. , Disp: , Rfl:  .  Dulaglutide 1.5 MG/0.5ML SOPN, Inject 1.5 mg into the skin every Saturday. TRULICITY, Disp: , Rfl:  .  Febuxostat 80 MG TABS, Take 80 mg by mouth daily., Disp: , Rfl:  .  furosemide (LASIX) 40 MG tablet, Take 40 mg by mouth daily., Disp: , Rfl:  .  HYDROcodone-acetaminophen (NORCO/VICODIN) 5-325 MG tablet, Take 1 tablet by mouth every 6 (six) hours as needed., Disp: 14 tablet, Rfl: 0 .  Insulin Aspart FlexPen 100 UNIT/ML SOPN, Inject 10-16 mLs as directed 2 (two) times daily., Disp: , Rfl:  .  insulin glargine, 2 Unit Dial, (TOUJEO MAX SOLOSTAR) 300 UNIT/ML Solostar Pen, Inject into the skin., Disp: , Rfl:  .  JARDIANCE 10 MG TABS tablet, Take 10 mg by mouth daily., Disp: , Rfl:  .  nitroGLYCERIN (NITROSTAT) 0.4 MG SL tablet, Place 1 tablet (0.4 mg total) under the  tongue every 5 (five) minutes as needed for chest pain. DISSOLVE 1 TABLET UNDER TONGUE EVERY 5 MINUTES UP TO 15 MIN FOR CHEST PAIN. IF NO RELIEF CALL 911, Disp: 25 tablet, Rfl: 0 .  pantoprazole (PROTONIX) 40 MG tablet, Take 40 mg by mouth daily as needed., Disp: , Rfl:  .  pregabalin (LYRICA) 75 MG capsule, Take 75 mg by mouth 3 (three) times daily., Disp: , Rfl:  .  rosuvastatin (CRESTOR) 10 MG tablet, Take 10 mg by mouth daily., Disp: , Rfl:  .  tadalafil (CIALIS) 5 MG tablet, Take 5 mg by mouth at bedtime. , Disp: , Rfl:  .  zolpidem (AMBIEN) 10 MG tablet, Take 10 mg by mouth at bedtime., Disp: , Rfl:     Assessment     ICD-10-CM   1. Coronary artery disease of native artery of native heart with stable angina pectoris (HCC)  I25.118 EKG 12-Lead    2. Asymptomatic bilateral carotid artery stenosis  I65.23     3. Hypercholesteremia  E78.00     4. CKD (chronic kidney disease) stage 4, GFR 15-29 ml/min (HCC)  N18.4       No orders of the defined types were placed in this encounter.  Orders Placed This Encounter  Procedures  . EKG 12-Lead    There are no discontinued medications.   Recommendations:   Allen Medina  is a 82 y.o. Caucasian male with type 2 diabetes, stage IV CKD being followed by Dr. Zetta Bills, diabetic peripheral neuropathy, diabetic retinopathy, hypertension, hyperlipidemia, chronic back pain and DJD and  asymptomatic bilateral carotid stenosis, CAD and S/P CABG 1991 followed by redo CABG in 2003 due to occluded bypass grafts except LIMA to LAD,  MI in 2006 needing stent to SVG to RCA, acute inferior and lateral myocardial infarction on 11/13/2019, and had stenting to SVG to OM 2 and also to native distal OM 2 with implantation of 2 overlapping DES in [place].  1. Coronary artery disease of native artery of native heart with stable angina pectoris [facility]) Patient is presently doing well, there is a 22-month office visit, he has not used any sublingual  nitroglycerin and he has not had any recurrence of angina pectoris.  No changes in his EKG. - EKG 12-Lead  2. Asymptomatic bilateral carotid artery stenosis Carotid artery duplex reveals very mild disease bilaterally, he is on a statin and is also on aspirin.  I reviewed his external labs, lipids under excellent control.  His diabetes is also well-controlled.  3. Hypercholesteremia External labs reviewed, LDL is at goal.  4. CKD (chronic kidney disease) stage 4, GFR 15-29 ml/min (HCC) Reviewed his labs, stage IV chronic kidney disease has remained stable, he is on appropriate dose of Jardiance, amlodipine and carvedilol for hypertension and also for coronary artery disease, not on an ACE inhibitor or ARB due to stage IV chronic kidney disease.  Overall stable from cardiac standpoint, I will see him back in a year or sooner if problems.  I will repeat carotid artery duplex prior to his next office visit.     Yates Decamp, MD, [facility] 07/10/2023, 10:38 AM Office: [phone] Pager: [phone]

## 2023-07-28 DIAGNOSIS — E1129 Type 2 diabetes mellitus with other diabetic kidney complication: Secondary | ICD-10-CM | POA: Diagnosis not present

## 2023-08-07 ENCOUNTER — Encounter (INDEPENDENT_AMBULATORY_CARE_PROVIDER_SITE_OTHER): Payer: Medicare PPO | Admitting: Ophthalmology

## 2023-08-07 DIAGNOSIS — Z794 Long term (current) use of insulin: Secondary | ICD-10-CM

## 2023-08-07 DIAGNOSIS — E113593 Type 2 diabetes mellitus with proliferative diabetic retinopathy without macular edema, bilateral: Secondary | ICD-10-CM | POA: Diagnosis not present

## 2023-08-07 DIAGNOSIS — I10 Essential (primary) hypertension: Secondary | ICD-10-CM

## 2023-08-07 DIAGNOSIS — H35033 Hypertensive retinopathy, bilateral: Secondary | ICD-10-CM

## 2023-08-09 DIAGNOSIS — Z7902 Long term (current) use of antithrombotics/antiplatelets: Secondary | ICD-10-CM | POA: Diagnosis not present

## 2023-08-09 DIAGNOSIS — E119 Type 2 diabetes mellitus without complications: Secondary | ICD-10-CM | POA: Diagnosis not present

## 2023-08-09 DIAGNOSIS — M549 Dorsalgia, unspecified: Secondary | ICD-10-CM | POA: Diagnosis not present

## 2023-08-09 DIAGNOSIS — E785 Hyperlipidemia, unspecified: Secondary | ICD-10-CM | POA: Diagnosis not present

## 2023-08-09 DIAGNOSIS — I2510 Atherosclerotic heart disease of native coronary artery without angina pectoris: Secondary | ICD-10-CM | POA: Diagnosis not present

## 2023-08-09 DIAGNOSIS — M79661 Pain in right lower leg: Secondary | ICD-10-CM | POA: Diagnosis not present

## 2023-08-09 DIAGNOSIS — S86911A Strain of unspecified muscle(s) and tendon(s) at lower leg level, right leg, initial encounter: Secondary | ICD-10-CM | POA: Diagnosis not present

## 2023-08-09 DIAGNOSIS — K219 Gastro-esophageal reflux disease without esophagitis: Secondary | ICD-10-CM | POA: Diagnosis not present

## 2023-08-09 DIAGNOSIS — M109 Gout, unspecified: Secondary | ICD-10-CM | POA: Diagnosis not present

## 2023-08-09 DIAGNOSIS — G8929 Other chronic pain: Secondary | ICD-10-CM | POA: Diagnosis not present

## 2023-08-31 DIAGNOSIS — M545 Low back pain, unspecified: Secondary | ICD-10-CM | POA: Diagnosis not present

## 2023-08-31 DIAGNOSIS — M5416 Radiculopathy, lumbar region: Secondary | ICD-10-CM | POA: Diagnosis not present

## 2023-09-04 DIAGNOSIS — N184 Chronic kidney disease, stage 4 (severe): Secondary | ICD-10-CM | POA: Diagnosis not present

## 2023-09-04 DIAGNOSIS — E1129 Type 2 diabetes mellitus with other diabetic kidney complication: Secondary | ICD-10-CM | POA: Diagnosis not present

## 2023-09-04 DIAGNOSIS — M5416 Radiculopathy, lumbar region: Secondary | ICD-10-CM | POA: Diagnosis not present

## 2023-09-04 DIAGNOSIS — M109 Gout, unspecified: Secondary | ICD-10-CM | POA: Diagnosis not present

## 2023-09-04 DIAGNOSIS — I129 Hypertensive chronic kidney disease with stage 1 through stage 4 chronic kidney disease, or unspecified chronic kidney disease: Secondary | ICD-10-CM | POA: Diagnosis not present

## 2023-09-04 DIAGNOSIS — M961 Postlaminectomy syndrome, not elsewhere classified: Secondary | ICD-10-CM | POA: Diagnosis not present

## 2023-09-04 DIAGNOSIS — E785 Hyperlipidemia, unspecified: Secondary | ICD-10-CM | POA: Diagnosis not present

## 2023-09-04 DIAGNOSIS — Z125 Encounter for screening for malignant neoplasm of prostate: Secondary | ICD-10-CM | POA: Diagnosis not present

## 2023-09-04 DIAGNOSIS — M5136 Other intervertebral disc degeneration, lumbar region: Secondary | ICD-10-CM | POA: Diagnosis not present

## 2023-09-05 DIAGNOSIS — N1832 Chronic kidney disease, stage 3b: Secondary | ICD-10-CM | POA: Diagnosis not present

## 2023-09-11 DIAGNOSIS — N184 Chronic kidney disease, stage 4 (severe): Secondary | ICD-10-CM | POA: Diagnosis not present

## 2023-09-11 DIAGNOSIS — E1129 Type 2 diabetes mellitus with other diabetic kidney complication: Secondary | ICD-10-CM | POA: Diagnosis not present

## 2023-09-11 DIAGNOSIS — D692 Other nonthrombocytopenic purpura: Secondary | ICD-10-CM | POA: Diagnosis not present

## 2023-09-11 DIAGNOSIS — Z794 Long term (current) use of insulin: Secondary | ICD-10-CM | POA: Diagnosis not present

## 2023-09-11 DIAGNOSIS — Z1331 Encounter for screening for depression: Secondary | ICD-10-CM | POA: Diagnosis not present

## 2023-09-11 DIAGNOSIS — Z0000 Encounter for general adult medical examination without abnormal findings: Secondary | ICD-10-CM | POA: Diagnosis not present

## 2023-09-11 DIAGNOSIS — Z1339 Encounter for screening examination for other mental health and behavioral disorders: Secondary | ICD-10-CM | POA: Diagnosis not present

## 2023-09-11 DIAGNOSIS — I129 Hypertensive chronic kidney disease with stage 1 through stage 4 chronic kidney disease, or unspecified chronic kidney disease: Secondary | ICD-10-CM | POA: Diagnosis not present

## 2023-09-11 DIAGNOSIS — I10 Essential (primary) hypertension: Secondary | ICD-10-CM | POA: Diagnosis not present

## 2023-09-11 DIAGNOSIS — I739 Peripheral vascular disease, unspecified: Secondary | ICD-10-CM | POA: Diagnosis not present

## 2023-09-11 DIAGNOSIS — E669 Obesity, unspecified: Secondary | ICD-10-CM | POA: Diagnosis not present

## 2023-09-11 DIAGNOSIS — R82998 Other abnormal findings in urine: Secondary | ICD-10-CM | POA: Diagnosis not present

## 2023-09-11 DIAGNOSIS — R1319 Other dysphagia: Secondary | ICD-10-CM | POA: Diagnosis not present

## 2023-09-11 DIAGNOSIS — Z23 Encounter for immunization: Secondary | ICD-10-CM | POA: Diagnosis not present

## 2023-09-14 DIAGNOSIS — N2581 Secondary hyperparathyroidism of renal origin: Secondary | ICD-10-CM | POA: Diagnosis not present

## 2023-09-14 DIAGNOSIS — N1832 Chronic kidney disease, stage 3b: Secondary | ICD-10-CM | POA: Diagnosis not present

## 2023-09-14 DIAGNOSIS — I129 Hypertensive chronic kidney disease with stage 1 through stage 4 chronic kidney disease, or unspecified chronic kidney disease: Secondary | ICD-10-CM | POA: Diagnosis not present

## 2023-09-14 DIAGNOSIS — D631 Anemia in chronic kidney disease: Secondary | ICD-10-CM | POA: Diagnosis not present

## 2023-09-21 DIAGNOSIS — M5416 Radiculopathy, lumbar region: Secondary | ICD-10-CM | POA: Diagnosis not present

## 2023-10-26 DIAGNOSIS — E1129 Type 2 diabetes mellitus with other diabetic kidney complication: Secondary | ICD-10-CM | POA: Diagnosis not present

## 2023-12-05 DIAGNOSIS — I129 Hypertensive chronic kidney disease with stage 1 through stage 4 chronic kidney disease, or unspecified chronic kidney disease: Secondary | ICD-10-CM | POA: Diagnosis not present

## 2023-12-05 DIAGNOSIS — N184 Chronic kidney disease, stage 4 (severe): Secondary | ICD-10-CM | POA: Diagnosis not present

## 2023-12-05 DIAGNOSIS — I25810 Atherosclerosis of coronary artery bypass graft(s) without angina pectoris: Secondary | ICD-10-CM | POA: Diagnosis not present

## 2023-12-05 DIAGNOSIS — Z794 Long term (current) use of insulin: Secondary | ICD-10-CM | POA: Diagnosis not present

## 2023-12-05 DIAGNOSIS — E1129 Type 2 diabetes mellitus with other diabetic kidney complication: Secondary | ICD-10-CM | POA: Diagnosis not present

## 2023-12-19 DIAGNOSIS — Z5181 Encounter for therapeutic drug level monitoring: Secondary | ICD-10-CM | POA: Diagnosis not present

## 2023-12-19 DIAGNOSIS — M5416 Radiculopathy, lumbar region: Secondary | ICD-10-CM | POA: Diagnosis not present

## 2023-12-19 DIAGNOSIS — M961 Postlaminectomy syndrome, not elsewhere classified: Secondary | ICD-10-CM | POA: Diagnosis not present

## 2023-12-19 DIAGNOSIS — Z79899 Other long term (current) drug therapy: Secondary | ICD-10-CM | POA: Diagnosis not present

## 2023-12-19 DIAGNOSIS — M51362 Other intervertebral disc degeneration, lumbar region with discogenic back pain and lower extremity pain: Secondary | ICD-10-CM | POA: Diagnosis not present

## 2024-01-24 DIAGNOSIS — E1129 Type 2 diabetes mellitus with other diabetic kidney complication: Secondary | ICD-10-CM | POA: Diagnosis not present

## 2024-02-28 DIAGNOSIS — I129 Hypertensive chronic kidney disease with stage 1 through stage 4 chronic kidney disease, or unspecified chronic kidney disease: Secondary | ICD-10-CM | POA: Diagnosis not present

## 2024-02-28 DIAGNOSIS — I25810 Atherosclerosis of coronary artery bypass graft(s) without angina pectoris: Secondary | ICD-10-CM | POA: Diagnosis not present

## 2024-02-28 DIAGNOSIS — N184 Chronic kidney disease, stage 4 (severe): Secondary | ICD-10-CM | POA: Diagnosis not present

## 2024-02-28 DIAGNOSIS — M5416 Radiculopathy, lumbar region: Secondary | ICD-10-CM | POA: Diagnosis not present

## 2024-02-28 DIAGNOSIS — E1129 Type 2 diabetes mellitus with other diabetic kidney complication: Secondary | ICD-10-CM | POA: Diagnosis not present

## 2024-02-28 DIAGNOSIS — L989 Disorder of the skin and subcutaneous tissue, unspecified: Secondary | ICD-10-CM | POA: Diagnosis not present

## 2024-02-28 DIAGNOSIS — I739 Peripheral vascular disease, unspecified: Secondary | ICD-10-CM | POA: Diagnosis not present

## 2024-02-28 DIAGNOSIS — Z794 Long term (current) use of insulin: Secondary | ICD-10-CM | POA: Diagnosis not present

## 2024-03-06 DIAGNOSIS — N1832 Chronic kidney disease, stage 3b: Secondary | ICD-10-CM | POA: Diagnosis not present

## 2024-03-08 DIAGNOSIS — H903 Sensorineural hearing loss, bilateral: Secondary | ICD-10-CM | POA: Diagnosis not present

## 2024-03-11 DIAGNOSIS — N2581 Secondary hyperparathyroidism of renal origin: Secondary | ICD-10-CM | POA: Diagnosis not present

## 2024-03-11 DIAGNOSIS — M109 Gout, unspecified: Secondary | ICD-10-CM | POA: Diagnosis not present

## 2024-03-11 DIAGNOSIS — D631 Anemia in chronic kidney disease: Secondary | ICD-10-CM | POA: Diagnosis not present

## 2024-03-11 DIAGNOSIS — I129 Hypertensive chronic kidney disease with stage 1 through stage 4 chronic kidney disease, or unspecified chronic kidney disease: Secondary | ICD-10-CM | POA: Diagnosis not present

## 2024-03-11 DIAGNOSIS — N189 Chronic kidney disease, unspecified: Secondary | ICD-10-CM | POA: Diagnosis not present

## 2024-03-11 DIAGNOSIS — E875 Hyperkalemia: Secondary | ICD-10-CM | POA: Diagnosis not present

## 2024-03-11 DIAGNOSIS — N1832 Chronic kidney disease, stage 3b: Secondary | ICD-10-CM | POA: Diagnosis not present

## 2024-03-20 DIAGNOSIS — C44319 Basal cell carcinoma of skin of other parts of face: Secondary | ICD-10-CM | POA: Diagnosis not present

## 2024-04-17 DIAGNOSIS — Z85828 Personal history of other malignant neoplasm of skin: Secondary | ICD-10-CM | POA: Diagnosis not present

## 2024-04-17 DIAGNOSIS — Z08 Encounter for follow-up examination after completed treatment for malignant neoplasm: Secondary | ICD-10-CM | POA: Diagnosis not present

## 2024-04-23 DIAGNOSIS — E1129 Type 2 diabetes mellitus with other diabetic kidney complication: Secondary | ICD-10-CM | POA: Diagnosis not present

## 2024-05-20 DIAGNOSIS — M961 Postlaminectomy syndrome, not elsewhere classified: Secondary | ICD-10-CM | POA: Diagnosis not present

## 2024-05-20 DIAGNOSIS — M5416 Radiculopathy, lumbar region: Secondary | ICD-10-CM | POA: Diagnosis not present

## 2024-06-04 DIAGNOSIS — I25810 Atherosclerosis of coronary artery bypass graft(s) without angina pectoris: Secondary | ICD-10-CM | POA: Diagnosis not present

## 2024-06-04 DIAGNOSIS — I129 Hypertensive chronic kidney disease with stage 1 through stage 4 chronic kidney disease, or unspecified chronic kidney disease: Secondary | ICD-10-CM | POA: Diagnosis not present

## 2024-06-04 DIAGNOSIS — N184 Chronic kidney disease, stage 4 (severe): Secondary | ICD-10-CM | POA: Diagnosis not present

## 2024-06-04 DIAGNOSIS — E1129 Type 2 diabetes mellitus with other diabetic kidney complication: Secondary | ICD-10-CM | POA: Diagnosis not present

## 2024-06-04 DIAGNOSIS — Z794 Long term (current) use of insulin: Secondary | ICD-10-CM | POA: Diagnosis not present

## 2024-06-24 ENCOUNTER — Ambulatory Visit (HOSPITAL_COMMUNITY)
Admission: RE | Admit: 2024-06-24 | Discharge: 2024-06-24 | Disposition: A | Discharge status: Home / Self Care | Payer: Medicare PPO | Source: Ambulatory Visit | Attending: Cardiology | Admitting: Cardiology

## 2024-06-24 ENCOUNTER — Other Ambulatory Visit: Payer: Medicare PPO

## 2024-06-24 DIAGNOSIS — I6523 Occlusion and stenosis of bilateral carotid arteries: Secondary | ICD-10-CM | POA: Diagnosis not present

## 2024-06-26 ENCOUNTER — Ambulatory Visit: Payer: Self-pay | Admitting: Cardiology

## 2024-06-26 NOTE — Progress Notes (Signed)
 Carotid artery duplex 06/24/2024: Right ICA focal heterogenous plaque.  1 to 39% stenosis. Left ICA diffuse mild and heterogeneous plaque.  1 to 39% stenosis. Right vertebral artery demonstrate antegrade flow.  Left vertebral artery demonstrates an occlusion. Normal flow hemodynamics seen in bilateral subclavian arteries. Compared to 06/27/2023, right ICA stenosis of 15 to 49% not present.  Medical therapy and observation for now no further artery duplex evaluation indicated.

## 2024-07-09 ENCOUNTER — Ambulatory Visit: Payer: Medicare PPO | Admitting: Cardiology

## 2024-07-14 ENCOUNTER — Other Ambulatory Visit: Payer: Self-pay | Admitting: Cardiology

## 2024-07-14 DIAGNOSIS — I10 Essential (primary) hypertension: Secondary | ICD-10-CM

## 2024-07-16 ENCOUNTER — Other Ambulatory Visit (HOSPITAL_COMMUNITY): Payer: Self-pay

## 2024-07-16 ENCOUNTER — Ambulatory Visit: Attending: Internal Medicine | Admitting: Cardiology

## 2024-07-16 ENCOUNTER — Encounter: Payer: Self-pay | Admitting: Cardiology

## 2024-07-16 VITALS — BP 130/61 | HR 55 | Resp 16 | Ht 65.0 in | Wt 188.1 lb

## 2024-07-16 DIAGNOSIS — I25118 Atherosclerotic heart disease of native coronary artery with other forms of angina pectoris: Secondary | ICD-10-CM

## 2024-07-16 DIAGNOSIS — I10 Essential (primary) hypertension: Secondary | ICD-10-CM | POA: Diagnosis not present

## 2024-07-16 DIAGNOSIS — N184 Chronic kidney disease, stage 4 (severe): Secondary | ICD-10-CM | POA: Diagnosis not present

## 2024-07-16 DIAGNOSIS — E7800 Pure hypercholesterolemia, unspecified: Secondary | ICD-10-CM

## 2024-07-16 MED ORDER — CLOPIDOGREL BISULFATE 75 MG PO TABS
75.0000 mg | ORAL_TABLET | Freq: Every day | ORAL | 3 refills | Status: AC
Start: 2024-07-16 — End: ?
  Filled 2024-07-16: qty 90, 90d supply, fill #0

## 2024-07-16 NOTE — Patient Instructions (Signed)
 Medication Instructions:  Your physician has recommended you make the following change in your medication:  Stop aspirin  Start Clopidogrel  75 mg by mouth daily   *If you need a refill on your cardiac medications before your next appointment, please call your pharmacy*  Lab Work: none If you have labs (blood work) drawn today and your tests are completely normal, you will receive your results only by: MyChart Message (if you have MyChart) OR A paper copy in the mail If you have any lab test that is abnormal or we need to change your treatment, we will call you to review the results.  Testing/Procedures: none  Follow-Up: At [facility], you and your health needs are our priority.  As part of our continuing mission to provide you with exceptional heart care, our providers are all part of one team.  This team includes your primary Cardiologist (physician) and Advanced Practice Providers or APPs (Physician Assistants and Nurse Practitioners) who all work together to provide you with the care you need, when you need it.  Your next appointment:   12 month(s)  Provider:   Gordy Bergamo, MD    We recommend signing up for the patient portal called MyChart.  Sign up information is provided on this After Visit Summary.  MyChart is used to connect with patients for Virtual Visits (Telemedicine).  Patients are able to view lab/test results, encounter notes, upcoming appointments, etc.  Non-urgent messages can be sent to your provider as well.   To learn more about what you can do with MyChart, go to [email or web address].   Other Instructions

## 2024-07-16 NOTE — Progress Notes (Signed)
 Cardiology Office Note:  .   Date:  07/16/2024  ID:  Allen Medina, DOB 10-25-1941, MRN [id number] PCP: Loreli Elsie JONETTA Mickey., MD  [facility] Providers Cardiologist:  Gordy Bergamo, MD   History of Present Illness: .   Allen Medina is a 83 y.o. Caucasian male with type 2 diabetes, stage IV CKD being followed by Dr. Gordy Blanch, diabetic peripheral neuropathy, diabetic retinopathy, hypertension, hyperlipidemia, chronic back pain and DJD with neurogenic claudication and carotid duplex in July 2025, very mild atherosclerosis noted, CAD and S/P CABG 1991 followed by redo CABG in 2003 due to occluded bypass grafts except LIMA to LAD, MI in 2006 needing stent to SVG to RCA, presenting again with acute inferior and lateral myocardial infarction on 11/13/2019, and had stenting to SVG to OM 2 and also to native distal OM 2 with implantation of 2 overlapping DES in [place] .  Overall, Patient now has LIMA to LAD, SVG to OM 2-3 and SVG to PDA.  Except for back pain and neurogenic claudication and occasional episodes of angina for which he takes nitroglycerin  with immediate relief of chest pain he has no specific complaints.  He is tolerating all his medications well.  He underwent carotid artery duplex.  Discussed the use of AI scribe software for clinical note transcription with the patient, who gave verbal consent to proceed.  History of Present Illness HELIX Allen Medina is an 83 year old male with coronary artery disease and chronic kidney disease who presents for cardiovascular follow-up.  He experiences occasional chest pain, using nitroglycerin  sporadically for relief, with no new concerns. His blood pressure at home is typically low, around 130/50 mmHg, and he is on amlodipine , carvedilol , and Lasix  for management. He has carotid stenosis with less than 50% blockage on the right side. He is on Jardiance for chronic kidney disease and Crestor  for high cholesterol.  Labs   No results  found for: LIPOA  Lab Results  Component Value Date   NA 139 12/24/2021   K 4.6 12/24/2021   CO2 23 12/24/2021   GLUCOSE 203 (H) 12/24/2021   BUN 45 (H) 12/24/2021   CREATININE 2.60 (H) 12/24/2021   CALCIUM  9.4 12/24/2021   EGFR 24 (L) 12/24/2021   GFRNONAA 18 (L) 01/11/2020      Latest Ref Rng & Units 12/24/2021    1:47 PM 01/11/2020    6:09 AM 01/10/2020    5:19 AM  BMP  Glucose 70 - 99 mg/dL 796  83  870   BUN 8 - 27 mg/dL 45  51  64   Creatinine 0.76 - 1.27 mg/dL 7.39  6.79  6.75   BUN/Creat Ratio 10 - 24 17     Sodium 134 - 144 mmol/L 139  139  138   Potassium 3.5 - 5.2 mmol/L 4.6  4.8  4.4   Chloride 96 - 106 mmol/L 97  108  105   CO2 20 - 29 mmol/L 23  22  22    Calcium  8.6 - 10.2 mg/dL 9.4  9.0  8.4    External Labs:  PCP labs 06/04/2024:  A1c 6.1%.  Urinary albumin  to creatinine ratio 10.  Labs 03/06/2024:  Vitamin D  45.7.  Hb 14.7/HCT 44.8, platelets 264, normal indicis.  BUN 24, creatinine 1.58, EGFR 44 mL, potassium 5.2.  Magnesium  2.2.  Labs 09/04/2023:  Total cholesterol 153, triglycerides 146, HDL 54, LDL 70.  TSH normal at 3.140.  ROS  Review of Systems  Cardiovascular:  Positive for chest pain (stable). Negative for dyspnea on exertion and leg swelling.  Musculoskeletal:  Positive for back pain.   Physical Exam:   VS:  BP 130/61 (BP Location: Left Arm, Patient Position: Sitting, Cuff Size: Large)   Pulse (!) 55   Resp 16   Ht 5' 5 (1.651 m)   Wt 188 lb 1.6 oz (85.3 kg)   SpO2 95%   BMI 31.30 kg/m    Wt Readings from Last 3 Encounters:  07/16/24 188 lb 1.6 oz (85.3 kg)  07/10/23 187 lb 12.8 oz (85.2 kg)  01/09/23 188 lb (85.3 kg)    Physical Exam Neck:     Vascular: No JVD.  Cardiovascular:     Rate and Rhythm: Normal rate and regular rhythm.     Pulses: Intact distal pulses.          Carotid pulses are  on the right side with bruit and  on the left side with bruit.    Heart sounds: Normal heart sounds. No murmur heard.    No  gallop.  Pulmonary:     Effort: Pulmonary effort is normal.     Breath sounds: Normal breath sounds.  Abdominal:     General: Bowel sounds are normal.     Palpations: Abdomen is soft.  Musculoskeletal:     Right lower leg: No edema.     Left lower leg: No edema.    Studies Reviewed: SABRA     CABG: Patent grafts by angio LIMA to LAD, SVG to OM 2 and 3 and SVG to PDA.  Coronary angiogram 11/13/2019 at [facility], [place] :  Distal left main 80% stenosed, LAD occluded proximally, LIMA to LAD patent.   Left circumflex is small.  SVG to OM 2-3 has large thrombus burden and is occluded just before OM 3.  S/P thrombectomy followed by 3.5 x 38 mm Synergy and distally 2.25 x 12 mm Synergy DES. RCA is codominant.  There are tandem 80% stenosis in the mid and distal vessel.  Right PDA is chronically occluded.  SVG to RCA has a 70% proximal stenosis prior to a widely patent stent in the midportion of the graft.  Distal to the stent 80% tandem stenosis with good runoff.    Carotid artery duplex 06/24/2024: Right ICA focal heterogenous plaque.  1 to 39% stenosis. Left ICA diffuse mild and heterogeneous plaque.  1 to 39% stenosis. Right vertebral artery demonstrate antegrade flow.  Left vertebral artery demonstrates an occlusion. Normal flow hemodynamics seen in bilateral subclavian arteries. Compared to 06/27/2023, right ICA stenosis of 15 to 49% not present.  EKG:    EKG Interpretation Date/Time:  Tuesday July 16 2024 08:55:29 EDT Ventricular Rate:  57 PR Interval:    QRS Duration:  90 QT Interval:  428 QTC Calculation: 416 R Axis:   13  Text Interpretation: EKG 07/16/2024: Normal sinus rhythm at the rate of 57 bpm, normal axis, no evidence of ischemia.  Compared to 01/11/2020, nonspecific high lateral T wave inversion not present. Confirmed by Xara Paulding, Jagadeesh [phone]) on 07/16/2024 9:27:06 AM  EKG 07/10/2023: Normal sinus rhythm at rate of 53 bpm, normal axis,  incomplete right bundle branch block. Nonspecific T abnormality.   Medications ordered    Meds ordered this encounter  Medications   clopidogrel  (PLAVIX ) 75 MG tablet    Sig: Take 1 tablet (75 mg total) by mouth daily.    Dispense:  90 tablet    Refill:  3  ASSESSMENT AND PLAN: .      ICD-10-CM   1. Coronary artery disease of native artery of native heart with stable angina pectoris (HCC)  I25.118 EKG 12-Lead    clopidogrel  (PLAVIX ) 75 MG tablet    2. CKD (chronic kidney disease) stage 4, GFR 15-29 ml/min (HCC)  N18.4     3. Hypercholesteremia  E78.00     4. Primary hypertension  I10      Assessment & Plan Coronary artery disease with angina Intermittent angina managed with nitroglycerin . Previous myocardial infarctions. Current treatment includes aspirin , but a switch to Plavix  is planned due to recurrent cardiac events. The decision to switch to Plavix  is based on its potential benefit over aspirin  in preventing further cardiac events. - Discontinue aspirin  - Initiate Plavix  in view of multiple coronary interventions and NSTEMI's in the past.  Chronic kidney disease stage 4 Renal function is well-managed. Managed with Jardiance 10 mg daily to protect renal function. Followed by nephrology. - Continue Jardiance 10 mg once daily  Hypertension Blood pressure is well-controlled with current medication regimen. Recent readings are within target range. - Continue amlodipine  10 mg once daily - Continue carvedilol  12.5 mg twice daily - Continue Lasix  40 mg once daily  Hyperlipidemia Cholesterol levels are well-controlled with current statin therapy. Recent cholesterol panel from September 2024 shows good control with LDL <70. - Continue Crestor  10 mg once daily  Sciatica and chronic back pain Chronic back pain with sciatica affecting mobility. Pain in calves noted, possibly related to sciatica. Managed with self-care measures such as massage.  He has got good peripheral  pulses and do not suspect vascular claudication.  Office visit in a year or sooner if problems.   Signed,  Gordy Bergamo, MD, [facility] 07/16/2024, 5:46 PM [facility] [address] [place], [place] [address] Phone: [phone]. Fax:  [phone]

## 2024-07-22 DIAGNOSIS — E1129 Type 2 diabetes mellitus with other diabetic kidney complication: Secondary | ICD-10-CM | POA: Diagnosis not present

## 2024-08-05 ENCOUNTER — Encounter (INDEPENDENT_AMBULATORY_CARE_PROVIDER_SITE_OTHER): Payer: Medicare PPO | Admitting: Ophthalmology

## 2024-08-05 DIAGNOSIS — E113593 Type 2 diabetes mellitus with proliferative diabetic retinopathy without macular edema, bilateral: Secondary | ICD-10-CM

## 2024-08-05 DIAGNOSIS — I10 Essential (primary) hypertension: Secondary | ICD-10-CM

## 2024-08-05 DIAGNOSIS — Z794 Long term (current) use of insulin: Secondary | ICD-10-CM

## 2024-08-05 DIAGNOSIS — H353112 Nonexudative age-related macular degeneration, right eye, intermediate dry stage: Secondary | ICD-10-CM

## 2024-08-05 DIAGNOSIS — H35033 Hypertensive retinopathy, bilateral: Secondary | ICD-10-CM | POA: Diagnosis not present

## 2024-08-18 ENCOUNTER — Other Ambulatory Visit: Payer: Self-pay | Admitting: Cardiology

## 2024-08-18 DIAGNOSIS — I10 Essential (primary) hypertension: Secondary | ICD-10-CM

## 2024-09-25 DIAGNOSIS — M79671 Pain in right foot: Secondary | ICD-10-CM | POA: Diagnosis not present

## 2024-09-27 DIAGNOSIS — E7849 Other hyperlipidemia: Secondary | ICD-10-CM | POA: Diagnosis not present

## 2024-09-27 DIAGNOSIS — I129 Hypertensive chronic kidney disease with stage 1 through stage 4 chronic kidney disease, or unspecified chronic kidney disease: Secondary | ICD-10-CM | POA: Diagnosis not present

## 2024-09-27 DIAGNOSIS — N184 Chronic kidney disease, stage 4 (severe): Secondary | ICD-10-CM | POA: Diagnosis not present

## 2024-09-27 DIAGNOSIS — Z1389 Encounter for screening for other disorder: Secondary | ICD-10-CM | POA: Diagnosis not present

## 2024-09-27 DIAGNOSIS — Z125 Encounter for screening for malignant neoplasm of prostate: Secondary | ICD-10-CM | POA: Diagnosis not present

## 2024-09-27 DIAGNOSIS — E1122 Type 2 diabetes mellitus with diabetic chronic kidney disease: Secondary | ICD-10-CM | POA: Diagnosis not present

## 2024-09-27 DIAGNOSIS — E785 Hyperlipidemia, unspecified: Secondary | ICD-10-CM | POA: Diagnosis not present

## 2024-09-27 DIAGNOSIS — E1142 Type 2 diabetes mellitus with diabetic polyneuropathy: Secondary | ICD-10-CM | POA: Diagnosis not present

## 2024-09-27 DIAGNOSIS — M109 Gout, unspecified: Secondary | ICD-10-CM | POA: Diagnosis not present

## 2024-10-04 DIAGNOSIS — N184 Chronic kidney disease, stage 4 (severe): Secondary | ICD-10-CM | POA: Diagnosis not present

## 2024-10-04 DIAGNOSIS — Z860101 Personal history of adenomatous and serrated colon polyps: Secondary | ICD-10-CM | POA: Diagnosis not present

## 2024-10-04 DIAGNOSIS — Z794 Long term (current) use of insulin: Secondary | ICD-10-CM | POA: Diagnosis not present

## 2024-10-04 DIAGNOSIS — J45909 Unspecified asthma, uncomplicated: Secondary | ICD-10-CM | POA: Diagnosis not present

## 2024-10-04 DIAGNOSIS — E1122 Type 2 diabetes mellitus with diabetic chronic kidney disease: Secondary | ICD-10-CM | POA: Diagnosis not present

## 2024-10-04 DIAGNOSIS — E785 Hyperlipidemia, unspecified: Secondary | ICD-10-CM | POA: Diagnosis not present

## 2024-10-04 DIAGNOSIS — E669 Obesity, unspecified: Secondary | ICD-10-CM | POA: Diagnosis not present

## 2024-10-04 DIAGNOSIS — I6523 Occlusion and stenosis of bilateral carotid arteries: Secondary | ICD-10-CM | POA: Diagnosis not present

## 2024-10-04 DIAGNOSIS — I129 Hypertensive chronic kidney disease with stage 1 through stage 4 chronic kidney disease, or unspecified chronic kidney disease: Secondary | ICD-10-CM | POA: Diagnosis not present

## 2024-10-04 DIAGNOSIS — G629 Polyneuropathy, unspecified: Secondary | ICD-10-CM | POA: Diagnosis not present

## 2024-10-04 DIAGNOSIS — Z1339 Encounter for screening examination for other mental health and behavioral disorders: Secondary | ICD-10-CM | POA: Diagnosis not present

## 2024-10-04 DIAGNOSIS — Z1331 Encounter for screening for depression: Secondary | ICD-10-CM | POA: Diagnosis not present

## 2024-10-04 DIAGNOSIS — Z0000 Encounter for general adult medical examination without abnormal findings: Secondary | ICD-10-CM | POA: Diagnosis not present

## 2024-10-04 DIAGNOSIS — R82998 Other abnormal findings in urine: Secondary | ICD-10-CM | POA: Diagnosis not present

## 2024-10-10 ENCOUNTER — Other Ambulatory Visit (HOSPITAL_COMMUNITY): Payer: Self-pay

## 2024-10-10 MED ORDER — OZEMPIC (1 MG/DOSE) 4 MG/3ML ~~LOC~~ SOPN
1.0000 mg | PEN_INJECTOR | SUBCUTANEOUS | 6 refills | Status: AC
  Filled 2024-10-10: qty 9, 84d supply, fill #0
  Filled 2024-12-27: qty 9, 84d supply, fill #1

## 2024-10-11 ENCOUNTER — Other Ambulatory Visit (HOSPITAL_COMMUNITY): Payer: Self-pay

## 2024-10-11 MED ORDER — COVID-19 MRNA VAC-TRIS(PFIZER) 30 MCG/0.3ML IM SUSY
0.3000 mL | PREFILLED_SYRINGE | Freq: Once | INTRAMUSCULAR | 0 refills | Status: AC
Start: 2024-10-11 — End: 2024-10-12
  Filled 2024-10-11: qty 0.3, 1d supply, fill #0

## 2024-10-20 DIAGNOSIS — E1129 Type 2 diabetes mellitus with other diabetic kidney complication: Secondary | ICD-10-CM | POA: Diagnosis not present

## 2024-10-24 DIAGNOSIS — M545 Low back pain, unspecified: Secondary | ICD-10-CM | POA: Diagnosis not present

## 2024-10-24 DIAGNOSIS — M51362 Other intervertebral disc degeneration, lumbar region with discogenic back pain and lower extremity pain: Secondary | ICD-10-CM | POA: Diagnosis not present

## 2024-10-24 DIAGNOSIS — M5416 Radiculopathy, lumbar region: Secondary | ICD-10-CM | POA: Diagnosis not present

## 2024-10-24 DIAGNOSIS — Z79899 Other long term (current) drug therapy: Secondary | ICD-10-CM | POA: Diagnosis not present

## 2024-10-24 DIAGNOSIS — M961 Postlaminectomy syndrome, not elsewhere classified: Secondary | ICD-10-CM | POA: Diagnosis not present

## 2024-11-13 DIAGNOSIS — C44319 Basal cell carcinoma of skin of other parts of face: Secondary | ICD-10-CM | POA: Diagnosis not present

## 2024-12-14 ENCOUNTER — Emergency Department (HOSPITAL_BASED_OUTPATIENT_CLINIC_OR_DEPARTMENT_OTHER)
Admission: EM | Admit: 2024-12-14 | Discharge: 2024-12-14 | Disposition: A | Discharge status: Home / Self Care | Attending: Emergency Medicine | Admitting: Emergency Medicine

## 2024-12-14 ENCOUNTER — Emergency Department (HOSPITAL_BASED_OUTPATIENT_CLINIC_OR_DEPARTMENT_OTHER)

## 2024-12-14 ENCOUNTER — Encounter (HOSPITAL_BASED_OUTPATIENT_CLINIC_OR_DEPARTMENT_OTHER): Payer: Self-pay

## 2024-12-14 ENCOUNTER — Emergency Department (HOSPITAL_BASED_OUTPATIENT_CLINIC_OR_DEPARTMENT_OTHER): Admitting: Radiology

## 2024-12-14 ENCOUNTER — Other Ambulatory Visit: Payer: Self-pay

## 2024-12-14 DIAGNOSIS — R519 Headache, unspecified: Secondary | ICD-10-CM | POA: Insufficient documentation

## 2024-12-14 DIAGNOSIS — Z955 Presence of coronary angioplasty implant and graft: Secondary | ICD-10-CM | POA: Insufficient documentation

## 2024-12-14 DIAGNOSIS — Z79899 Other long term (current) drug therapy: Secondary | ICD-10-CM | POA: Insufficient documentation

## 2024-12-14 DIAGNOSIS — I2510 Atherosclerotic heart disease of native coronary artery without angina pectoris: Secondary | ICD-10-CM | POA: Diagnosis not present

## 2024-12-14 DIAGNOSIS — Z7984 Long term (current) use of oral hypoglycemic drugs: Secondary | ICD-10-CM | POA: Insufficient documentation

## 2024-12-14 DIAGNOSIS — E1122 Type 2 diabetes mellitus with diabetic chronic kidney disease: Secondary | ICD-10-CM | POA: Insufficient documentation

## 2024-12-14 DIAGNOSIS — S161XXA Strain of muscle, fascia and tendon at neck level, initial encounter: Secondary | ICD-10-CM | POA: Insufficient documentation

## 2024-12-14 DIAGNOSIS — N189 Chronic kidney disease, unspecified: Secondary | ICD-10-CM | POA: Diagnosis not present

## 2024-12-14 DIAGNOSIS — I129 Hypertensive chronic kidney disease with stage 1 through stage 4 chronic kidney disease, or unspecified chronic kidney disease: Secondary | ICD-10-CM | POA: Diagnosis not present

## 2024-12-14 DIAGNOSIS — S63501A Unspecified sprain of right wrist, initial encounter: Secondary | ICD-10-CM | POA: Diagnosis not present

## 2024-12-14 DIAGNOSIS — W19XXXA Unspecified fall, initial encounter: Secondary | ICD-10-CM | POA: Diagnosis not present

## 2024-12-14 DIAGNOSIS — Z794 Long term (current) use of insulin: Secondary | ICD-10-CM | POA: Insufficient documentation

## 2024-12-14 DIAGNOSIS — M25531 Pain in right wrist: Secondary | ICD-10-CM

## 2024-12-14 DIAGNOSIS — M542 Cervicalgia: Secondary | ICD-10-CM | POA: Diagnosis present

## 2024-12-14 MED ORDER — LIDOCAINE 5 % EX PTCH: 1.0000 | MEDICATED_PATCH | CUTANEOUS | 0 refills | Status: AC

## 2024-12-14 MED ORDER — ACETAMINOPHEN 500 MG PO TABS
1000.0000 mg | ORAL_TABLET | Freq: Once | ORAL | Status: AC
  Administered 2024-12-14: 1000 mg via ORAL
  Filled 2024-12-14: qty 2

## 2024-12-14 MED ORDER — LIDOCAINE 5 % EX PTCH
2.0000 | MEDICATED_PATCH | CUTANEOUS | Status: DC
  Administered 2024-12-14: 2 via TRANSDERMAL
  Filled 2024-12-14: qty 2

## 2024-12-14 NOTE — ED Provider Notes (Signed)
 " [place] [facility]ERGENCY DEPARTMENT AT [facility] Provider Note   CSN: [id number] Arrival date & time: 12/14/24  9358     Patient presents with: Allen Medina is a 83 y.o. male with history of type 2 diabetes, CKD, hypertension, hyperlipidemia, CAD s/p CABG on clopidogrel , presents with concern for mechanical fall that occurred 6 days ago.  Reports he was standing by his golf cart when he lost his footing and fell sideways into the golf cart.  He caught himself with his hands, unsure if he hit his head.  There is no loss of consciousness.  Since then, he has had some right wrist pain that has been gradually getting better.  He also reports gradual onset of neck pain that worsens with movement.  Denies any vision changes, nausea or vomiting, paresthesias.  Denies any chest pain, shortness of breath, dizziness, or feelings of presyncope before this fall.    Fall       Prior to Admission medications  Medication Sig Start Date End Date Taking? Authorizing Provider  lidocaine  (LIDODERM ) 5 % Place 1-2 patches onto the skin daily. Apply 1-2 patches for up to 12 hours at a time, then remove patches for a full 12 hours before re-applying new patches. 12/14/24  Yes Veta Palma, PA-C  albuterol  (VENTOLIN  HFA) 108 (90 Base) MCG/ACT inhaler Inhale 2 puffs into the lungs every 6 (six) hours as needed for wheezing or shortness of breath.    [provider]  amLODipine  (NORVASC ) 10 MG tablet Take 1 tablet (10 mg total) by mouth daily. KEEP OV. 08/20/24   Ladona Heinz, MD  carvedilol  (COREG ) 12.5 MG tablet TAKE 1 TABLET BY MOUTH TWICE DAILY WITH MEALS. 11/14/22   Ladona Heinz, MD  Cholecalciferol  (VITAMIN D3) 5000 units CAPS Take 5,000 Units by mouth daily.    [provider]  clopidogrel  (PLAVIX ) 75 MG tablet Take 1 tablet (75 mg total) by mouth daily. 07/16/24   Ladona Heinz, MD  cyanocobalamin  (,VITAMIN B-12,) 1000 MCG/ML injection Inject 1,000 mcg into the muscle every  30 (thirty) days.     [provider]  Dulaglutide 1.5 MG/0.5ML SOPN Inject 1.5 mg into the skin every Saturday. TRULICITY    [provider]  Febuxostat  80 MG TABS Take 80 mg by mouth daily.    [provider]  furosemide  (LASIX ) 40 MG tablet Take 40 mg by mouth daily.    [provider]  HYDROcodone -acetaminophen  (NORCO/VICODIN) 5-325 MG tablet Take 1 tablet by mouth every 6 (six) hours as needed. 12/01/18   Zackowski, Scott, MD  insulin  glargine, 2 Unit Dial, (TOUJEO  MAX SOLOSTAR) 300 UNIT/ML Solostar Pen Inject into the skin.    [provider]  JARDIANCE 10 MG TABS tablet Take 10 mg by mouth daily. 05/09/22   [provider]  nitroGLYCERIN  (NITROSTAT ) 0.4 MG SL tablet Place 1 tablet (0.4 mg total) under the tongue every 5 (five) minutes as needed for chest pain. DISSOLVE 1 TABLET UNDER TONGUE EVERY 5 MINUTES UP TO 15 MIN FOR CHEST PAIN. IF NO RELIEF CALL 911 06/28/22   Ladona Heinz, MD  pantoprazole  (PROTONIX ) 40 MG tablet Take 40 mg by mouth daily as needed.    [provider]  pregabalin  (LYRICA ) 75 MG capsule Take 75 mg by mouth 3 (three) times daily.    [provider]  rosuvastatin  (CRESTOR ) 10 MG tablet Take 10 mg by mouth daily.    [provider]  Semaglutide , 1 MG/DOSE, (OZEMPIC ,  1 MG/DOSE,) 4 MG/3ML SOPN Inject 1 mg into the skin once a week. 06/18/24     tadalafil (CIALIS) 5 MG tablet Take 5 mg by mouth at bedtime.     [provider]  zolpidem  (AMBIEN ) 10 MG tablet Take 10 mg by mouth at bedtime.    [provider]    Allergies: Atorvastatin , Pravastatin, and Sitagliptin    Review of Systems  Musculoskeletal:        Right wrist pain    Updated Vital Signs BP (!) 156/63 (BP Location: Right Arm)   Pulse 68   Temp 99.3 F (37.4 C)   Resp 18   Ht 5' 5 (1.651 m)   Wt 81.6 kg   SpO2 98%   BMI 29.95 kg/m   Physical Exam Vitals and nursing note reviewed.  Constitutional:       General: He is not in acute distress.    Appearance: He is well-developed.  HENT:     Head: Normocephalic and atraumatic.  Eyes:     Conjunctiva/sclera: Conjunctivae normal.  Cardiovascular:     Rate and Rhythm: Normal rate and regular rhythm.     Heart sounds: No murmur heard.    Comments: 2+ radial pulse bilaterally Pulmonary:     Effort: Pulmonary effort is normal. No respiratory distress.     Breath sounds: Normal breath sounds.  Abdominal:     Palpations: Abdomen is soft.     Tenderness: There is no abdominal tenderness.  Musculoskeletal:        General: No swelling.     Cervical back: Neck supple.     Comments: General No obvious deformity. No erythema, edema, contusions, open wounds   Palpation Non-tender to palpation of the clavicles,humerus, radius and ulna, carpal bones, 1st-5th metacarpals and phalanges bilaterally. Specifically, no tenderness over the triquetrum of the right wrist. Non tender over the femur, patella, tibia or fibula bilaterally  Non-tender over the cervical, thoracic, or lumbar spinous processes. Non-tender to palpation of the paraspinal region of the back or chest wall diffusely  No tenderness of the pelvis diffusely  ROM Full ROM of shoulders bilaterally Full elbow, wrist, knee flexion and extension bilaterally Intact plantarflexion and dorsiflexion, hip flexion bilaterally Ambulates without difficulty  Sensation: Sensation intact throughout the bilateral upper and lower extremity  Strength: 5/5 strength with resisted elbow and wrist flexion and extension bilaterally 5/5 strength with resisted knee flexion and extension and ankle plantarflexion and dorsiflexion bilaterally    Skin:    General: Skin is warm and dry.     Capillary Refill: Capillary refill takes less than 2 seconds.  Neurological:     Mental Status: He is alert.  Psychiatric:        Mood and Affect: Mood normal.     (all labs ordered are listed, but only abnormal  results are displayed) Labs Reviewed - No data to display  EKG: None  Radiology: DG Wrist Complete Right Result Date: 12/14/2024 CLINICAL DATA:  Fall with right wrist pain. EXAM: RIGHT WRIST - COMPLETE 3+ VIEW COMPARISON:  None Available. FINDINGS: No gross fracture or dislocation. Lateral film shows mineralization posterior to the carpus which could reflect a triquetral fracture although the patient has substantial chondrocalcinosis which could also generate this appearance. Degenerative changes are noted in the first carpometacarpal joint. IMPRESSION: Mineralization posterior to the carpus on the lateral film could reflect a triquetral fracture although the patient has substantial chondrocalcinosis which could also generate this appearance. Correlation for point tenderness  recommended. Electronically Signed   By: Camellia Candle M.D.   On: 12/14/2024 08:57   CT CERVICAL SPINE WO CONTRAST Result Date: 12/14/2024 EXAM: CT CERVICAL SPINE WITHOUT CONTRAST 12/14/2024 07:47:13 AM TECHNIQUE: CT of the cervical spine was performed without the administration of intravenous contrast. Multiplanar reformatted images are provided for review. Automated exposure control, iterative reconstruction, and/or weight based adjustment of the mA/kV was utilized to reduce the radiation dose to as low as reasonably achievable. COMPARISON: CT head reported separately today. CLINICAL HISTORY: 83 year old male with neck trauma due to a fall. FINDINGS: BONES AND ALIGNMENT: Osteopenia. Straightening of cervical lordosis. No acute fracture or traumatic malalignment. DEGENERATIVE CHANGES: Chronic severe degeneration at the anterior C1-C2 articulation including partially calcified surrounding ligamentous hypertrophy, odontoid sclerosis and subchondral cysts. Chronic severe disc and endplate degeneration at C6-C7. Bulky ligament flavum hypertrophy and calcification at C3-C4, posterior ligamentous ossification or calcification also at  that level. Subsequent moderate cervical spinal stenosis suspected at C3-C4 (series 7 image 37). Bulky calcified ligament flavum hypertrophy also at C4-C5. SOFT TISSUES: No prevertebral soft tissue swelling. Sternotomy visible at the thoracic inlet. Cervical carotid artery atherosclerosis, and a retropharyngeal course of the internal carotid arteries (series 4 image 37). Globular retained secretions in the trachea at the thoracic inlet (series 5 image 54 and series 3 images 81 and 83). Above and below that level the trachea appears to remain patent. Negative visible lung apices. IMPRESSION: 1. No acute traumatic injury identified in the cervical spine. 2. Globular retained tracheal secretions at the thoracic inlet. Consider aspiration. 3. Osteopenia and advanced cervical spine degeneration including bulky calcification or ossification of the spinal ligaments at C3-C4. Moderate spinal stenosis suspected. Electronically signed by: Helayne Hurst MD 12/14/2024 08:05 AM EST RP Workstation: [id number]   CT Head Wo Contrast Result Date: 12/14/2024 EXAM: CT HEAD WITHOUT CONTRAST 12/14/2024 07:47:13 AM TECHNIQUE: CT of the head was performed without the administration of intravenous contrast. Automated exposure control, iterative reconstruction, and/or weight based adjustment of the mA/kV was utilized to reduce the radiation dose to as low as reasonably achievable. COMPARISON: Brain MRI 04/04/2022. CLINICAL HISTORY: 83 year old male with head and neck pain. FINDINGS: BRAIN AND VENTRICLES: No acute hemorrhage. No evidence of acute infarct. No hydrocephalus. No extra-axial collection. No mass effect or midline shift. Brain volume within normal limits for age. Patchy and moderate bilateral cerebral white matter hypodensity, mostly periventricular, and appears increased since the prior MRI . Otherwise maintained gray white differentiation. Calcified atherosclerosis at the skull base. ORBITS: No acute abnormality. SINUSES:  Visible paranasal sinuses, tympanic cavities and mastoids are well aerated. SOFT TISSUES AND SKULL: No acute soft tissue abnormality. No skull fracture. IMPRESSION: 1. No acute intracranial abnormality. 2. Moderate for age chronic white matter disease, progressed since 2023. Electronically signed by: Helayne Hurst MD 12/14/2024 07:59 AM EST RP Workstation: [id number]     Procedures   Medications Ordered in the ED  lidocaine  (LIDODERM ) 5 % 2 patch (2 patches Transdermal Patch Applied 12/14/24 0949)  acetaminophen  (TYLENOL ) tablet 1,000 mg (1,000 mg Oral Given 12/14/24 0946)                                    Medical Decision Making Amount and/or Complexity of Data Reviewed Radiology: ordered.  Risk OTC drugs. Prescription drug management.    Differential diagnosis includes but is not limited to intracranial hemorrhage, fracture, dislocation, sprain, strain, contusion, laceration, nerve injury, vascular  injury, compartment syndrome  ED Course:  Upon initial evaluation, patient is well-appearing, no acute distress.  He is reporting pain to his neck that came on gradually after his fall.  On exam, he is tender over the cervical musculature bilaterally.  Pain worsens when moving neck left and right, but no nuchal rigidity.  He does not have any point tenderness over the cervical, thoracic, or lumbar spine.  Given age, use of Plavix , and patient unsure if he hit his head during the fall, CT scan of the head and cervical spine was obtained.  This did not show any acute injury such as skull fracture, intracranial hemorrhage, or vertebral fracture.  CT cervical spine did show a globular retained tracheal secretion concerning for possible aspiration.  However, patient denies any known aspiration or choking event.  He denies any fever or chills.  Denies any shortness of breath.  Low concern for aspiration pneumonia at this time.  No indication for any antibiotics for aspiration pneumonia at this  time. He also reports catching himself with his right wrist after the fall.  He reports some pain more over the lunate bone, but does not have any point tenderness over this area.  X-ray of the right wrist was obtained which showed possible triquetrum fracture vs chondrocalcinosis.  However, he does not have any point tenderness over the triquetrum, suspect the x-ray finding is secondary to chondrocalcinosis and not fracture.  He has full range of motion of the right wrist and 1st through 5th MCPs, PIP, DIP joints of the right hand.  He is neurovascularly intact in the right hand.  Suspect that his pain is secondary to a wrist sprain.  I did offer patient a wrist brace which he declines, stating that he does not think he needs this since his pain is improving. Patient has good story for mechanical fall, stating he tripped over his foot.  Denies any symptoms such as presyncope, dizziness, chest pain, shortness of breath.  Low concern for arrhythmia, electrolyte abnormality, or other cause to this fall.  No indication for EKG or labs at this time.    Imaging Studies ordered: I ordered imaging studies including x-ray right wrist, CT head, CT cervical spine I independently visualized the imaging with scope of interpretation limited to determining acute life threatening conditions related to emergency care. Imaging showed  X-ray right wrist: IMPRESSION:  Mineralization posterior to the carpus on the lateral film could  reflect a triquetral fracture although the patient has substantial  chondrocalcinosis which could also generate this appearance.  Correlation for point tenderness recommended.   CT cervical spine: IMPRESSION:  1. No acute traumatic injury identified in the cervical spine.  2. Globular retained tracheal secretions at the thoracic inlet. Consider  aspiration.  3. Osteopenia and advanced cervical spine degeneration including bulky  calcification or ossification of the spinal ligaments at  C3-C4. 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   CT head: IMPRESSION:  1. No acute intracranial abnormality.  2. Moderate for age chronic white matter disease, progressed since 2023.   I agree with the radiologist interpretation   Medications Given: Lidocaine  patch Tylenol    Patient stable and appropriate for discharge home.     Impression: Right wrist sprain Cervical strain  Disposition:  Patient discharged home with instructions to use prescribed lidocaine  patches as needed for pain.  May use heating packs on his neck to help with pain.  Tylenol   as needed for pain.  Follow-up with PCP within the next week if  symptoms not improving.  We did discuss the finding of possible aspiration on his CT scan.  He understands that if he develops fever, chills, cough, he needs to follow-up immediately with his PCP. Return precautions given and patient verbalized understanding.  This chart was dictated using voice recognition software, Dragon. Despite the best efforts of this provider to proofread and correct errors, errors may still occur which can change documentation meaning.       Final diagnoses:  Cervical strain, acute, initial encounter  Acute pain of right wrist    ED Discharge Orders          Ordered    lidocaine  (LIDODERM ) 5 %  Every 24 hours        12/14/24 0948               Veta Palma, PA-C 12/14/24 1005  "

## 2024-12-14 NOTE — ED Triage Notes (Signed)
 Pt to home with c/o pain to his head, neck, R wrist and bilateral shoulder pain. Arrives A+O, VSS, NADN.

## 2024-12-14 NOTE — Discharge Instructions (Addendum)
 You do not have any brain bleed, skull fracture, or other injury noted on your CT scan of the head.  The CT scan of your neck shows some degenerative changes.  There is also concern for possible aspiration as there was some fluid noted in your airway, but you are not having any symptoms of this.  If you develop symptoms such as fever, cough, please follow-up immediately with your PCP as this could be a sign of pneumonia.  I have included your CT scan results of your cervical spine below so that you can show your PCP if needed.  The pain in your wrist is likely due to a sprain.  You may gradually return to activities as tolerated.  The pain in your neck seems to be due to a strain of the neck muscles.  This should also gradually improve with time.  Please engage in light physical activity (like walking) to prevent your pain from worsening and to prevent stiffness. Refrain from bedrest which can make your pain worse.   You may take up to 1000mg  of tylenol  every 6 hours as needed for pain.  Do not take more then 4g per day.    You may use a heating pack on your back to help with the pain.   You have been prescribed lidocaine  patched to help with pain. You may apply 1-2 patches to your back for up to 12 hours at a time. Then, you must remove the patch for a full 12 hours before re-applying a new patch.    Please contact your PCP if your pain does not start to improve over the next week for recheck.   Return to the ER if you have severe worsening of your pain, vision changes, persistent vomiting, any other new or concerning symptoms  CT cervical spine: IMPRESSION:  1. No acute traumatic injury identified in the cervical spine.  2. Globular retained tracheal secretions at the thoracic inlet. Consider  aspiration.  3. 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

## 2024-12-27 ENCOUNTER — Other Ambulatory Visit (HOSPITAL_COMMUNITY): Payer: Self-pay

## 2025-01-16 ENCOUNTER — Other Ambulatory Visit (HOSPITAL_COMMUNITY): Payer: Self-pay

## 2025-08-05 ENCOUNTER — Encounter (INDEPENDENT_AMBULATORY_CARE_PROVIDER_SITE_OTHER): Admitting: Ophthalmology
